# Patient Record
Sex: Female | Born: 1955 | Race: Asian | Hispanic: No | Marital: Married | Smoking: Never smoker
Health system: Southern US, Community
[De-identification: ages and names within clinical notes are randomized; demographics above are authoritative.]

## PROBLEM LIST (undated history)

## (undated) DIAGNOSIS — I471 Supraventricular tachycardia, unspecified: Secondary | ICD-10-CM

## (undated) DIAGNOSIS — K635 Polyp of colon: Secondary | ICD-10-CM

## (undated) DIAGNOSIS — M1990 Unspecified osteoarthritis, unspecified site: Secondary | ICD-10-CM

## (undated) DIAGNOSIS — M329 Systemic lupus erythematosus, unspecified: Secondary | ICD-10-CM

## (undated) DIAGNOSIS — I10 Essential (primary) hypertension: Secondary | ICD-10-CM

## (undated) DIAGNOSIS — I422 Other hypertrophic cardiomyopathy: Secondary | ICD-10-CM

## (undated) DIAGNOSIS — L930 Discoid lupus erythematosus: Secondary | ICD-10-CM

## (undated) DIAGNOSIS — M069 Rheumatoid arthritis, unspecified: Secondary | ICD-10-CM

## (undated) HISTORY — DX: Systemic lupus erythematosus, unspecified: M32.9

## (undated) HISTORY — DX: Unspecified osteoarthritis, unspecified site: M19.90

## (undated) HISTORY — DX: Supraventricular tachycardia, unspecified: I47.10

## (undated) HISTORY — DX: Supraventricular tachycardia: I47.1

## (undated) HISTORY — DX: Other hypertrophic cardiomyopathy: I42.2

## (undated) HISTORY — DX: Essential (primary) hypertension: I10

## (undated) HISTORY — DX: Rheumatoid arthritis, unspecified: M06.9

## (undated) HISTORY — PX: TUBAL LIGATION: SHX77

## (undated) HISTORY — DX: Polyp of colon: K63.5

---
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
## 1986-05-23 HISTORY — PX: BREAST BIOPSY: SHX20

## 1997-11-11 ENCOUNTER — Other Ambulatory Visit: Admission: RE | Admit: 1997-11-11 | Discharge: 1997-11-11 | Payer: Self-pay | Admitting: Dermatology

## 2004-03-01 ENCOUNTER — Other Ambulatory Visit: Admission: RE | Admit: 2004-03-01 | Discharge: 2004-03-01 | Payer: Self-pay | Admitting: Family Medicine

## 2004-04-02 LAB — HM COLONOSCOPY: HM Colonoscopy: NEGATIVE

## 2009-11-30 DIAGNOSIS — M329 Systemic lupus erythematosus, unspecified: Secondary | ICD-10-CM | POA: Insufficient documentation

## 2009-11-30 DIAGNOSIS — L930 Discoid lupus erythematosus: Secondary | ICD-10-CM

## 2009-11-30 HISTORY — DX: Discoid lupus erythematosus: L93.0

## 2010-03-23 HISTORY — PX: TRANSTHORACIC ECHOCARDIOGRAM: SHX275

## 2010-04-08 ENCOUNTER — Ambulatory Visit: Payer: Self-pay | Admitting: Family Medicine

## 2010-04-08 ENCOUNTER — Encounter: Admission: RE | Admit: 2010-04-08 | Discharge: 2010-04-08 | Payer: Self-pay | Admitting: Internal Medicine

## 2010-04-12 ENCOUNTER — Ambulatory Visit (HOSPITAL_COMMUNITY): Admission: RE | Admit: 2010-04-12 | Discharge: 2010-04-12 | Payer: Self-pay | Admitting: Internal Medicine

## 2010-04-12 HISTORY — PX: CARDIAC CATHETERIZATION: SHX172

## 2010-09-16 ENCOUNTER — Other Ambulatory Visit (HOSPITAL_COMMUNITY)
Admission: RE | Admit: 2010-09-16 | Discharge: 2010-09-16 | Disposition: A | Discharge status: Home / Self Care | Payer: 59 | Source: Ambulatory Visit | Attending: Family Medicine | Admitting: Family Medicine

## 2010-09-16 ENCOUNTER — Encounter (INDEPENDENT_AMBULATORY_CARE_PROVIDER_SITE_OTHER): Payer: 59 | Admitting: Family Medicine

## 2010-09-16 ENCOUNTER — Other Ambulatory Visit: Payer: Self-pay | Admitting: Family Medicine

## 2010-09-16 DIAGNOSIS — Z124 Encounter for screening for malignant neoplasm of cervix: Secondary | ICD-10-CM | POA: Insufficient documentation

## 2010-09-16 DIAGNOSIS — E7800 Pure hypercholesterolemia, unspecified: Secondary | ICD-10-CM

## 2010-09-16 DIAGNOSIS — Z0000 Encounter for general adult medical examination without abnormal findings: Secondary | ICD-10-CM

## 2010-09-17 LAB — HM PAP SMEAR: HM Pap smear: NEGATIVE

## 2010-10-07 LAB — HM MAMMOGRAPHY: HM Mammogram: NEGATIVE

## 2011-01-04 ENCOUNTER — Encounter: Payer: Self-pay | Admitting: Family Medicine

## 2011-07-19 ENCOUNTER — Encounter: Payer: Self-pay | Admitting: Family Medicine

## 2011-07-19 ENCOUNTER — Ambulatory Visit (INDEPENDENT_AMBULATORY_CARE_PROVIDER_SITE_OTHER): Payer: 59 | Admitting: Family Medicine

## 2011-07-19 VITALS — BP 117/70 | HR 67 | Wt 157.0 lb

## 2011-07-19 DIAGNOSIS — H8110 Benign paroxysmal vertigo, unspecified ear: Secondary | ICD-10-CM

## 2011-07-19 MED ORDER — MECLIZINE HCL 12.5 MG PO TABS: 12.5000 mg | ORAL_TABLET | Freq: Three times a day (TID) | ORAL | Status: AC | PRN

## 2011-07-19 NOTE — Progress Notes (Signed)
  Subjective:    Patient ID: Alicia Raymond, female    DOB: 30-Apr-1956, 56 y.o.   MRN: [id number]  HPI She is here for evaluation of dizziness. She noted this starting last night. She does have a previous history of right earache for 3 days prior to that but no fever, chills, double vision, heart rate changes. She does note that the dizziness is worse with certain head positions. She also has some nausea associated with this. It is bothering her less today.   Review of Systems     Objective:   Physical Exam alert and in no distress. Tympanic membranes and canals are normal. Throat is clear. Tonsils are normal. Neck is supple without adenopathy or thyromegaly. Cardiac exam shows a regular sinus rhythm without murmurs or gallops. Lungs are clear to auscultation. Dizziness was elicited by turning her head to the right as well as upward. Less dizziness was noted turning to the left.       Assessment & Plan:   1. Benign paroxysmal positional vertigo    I reassured her that she was not in any danger. Discussed possible options including Epley maneuvers. Initially I will use Antivert and if no improvement, refer from the maneuvers.

## 2012-10-01 ENCOUNTER — Encounter: Payer: Self-pay | Admitting: Family Medicine

## 2012-10-08 LAB — HM DEXA SCAN

## 2012-10-10 ENCOUNTER — Encounter: Payer: Self-pay | Admitting: Internal Medicine

## 2012-11-08 ENCOUNTER — Encounter: Payer: Self-pay | Admitting: Internal Medicine

## 2012-11-08 ENCOUNTER — Ambulatory Visit (INDEPENDENT_AMBULATORY_CARE_PROVIDER_SITE_OTHER): Payer: 59 | Admitting: Internal Medicine

## 2012-11-08 VITALS — BP 120/78 | HR 74 | Ht 62.0 in | Wt 167.7 lb

## 2012-11-08 DIAGNOSIS — M329 Systemic lupus erythematosus, unspecified: Secondary | ICD-10-CM

## 2012-11-08 DIAGNOSIS — I422 Other hypertrophic cardiomyopathy: Secondary | ICD-10-CM

## 2012-11-08 DIAGNOSIS — I428 Other cardiomyopathies: Secondary | ICD-10-CM

## 2012-11-08 DIAGNOSIS — I10 Essential (primary) hypertension: Secondary | ICD-10-CM

## 2012-11-08 DIAGNOSIS — M069 Rheumatoid arthritis, unspecified: Secondary | ICD-10-CM

## 2012-11-08 MED ORDER — LOSARTAN POTASSIUM 50 MG PO TABS: 50.0000 mg | ORAL_TABLET | Freq: Every day | ORAL | Status: DC

## 2012-11-08 NOTE — Patient Instructions (Addendum)
Your physician wants you to follow-up in: 1 year. You will receive a reminder letter in the mail two months in advance. If you don't receive a letter, please call our office to schedule the follow-up appointment.  We have sent a refill for Losartan 50mg  to your Pharmacy. Take 1 tab (50mg ) daily.

## 2012-11-09 ENCOUNTER — Encounter: Payer: Self-pay | Admitting: Internal Medicine

## 2012-11-09 DIAGNOSIS — I10 Essential (primary) hypertension: Secondary | ICD-10-CM | POA: Insufficient documentation

## 2012-11-09 DIAGNOSIS — I422 Other hypertrophic cardiomyopathy: Secondary | ICD-10-CM | POA: Insufficient documentation

## 2012-11-09 NOTE — Progress Notes (Signed)
  OFFICE NOTE  Chief Complaint:  Routine followup  Primary Care Physician: Carollee Herter, MD  HPI:  Alicia Raymond is a 57 year old female who has a history of apical hypertrophic cardiomyopathy as well as mild hypertension, rheumatoid arthritis and lupus. Overall, those connective tissue disorders have been very well controlled and she has been on losartan, which is hopefully going to be helpful with providing her some prevention from apical hypertrophy. Over the past year, she has stopped exercising, and gained a small amount of weight. Otherwise she denies any chest pain, worsening shortness of breath, palpitations, presyncope or syncopal symptoms.  PMHx:  Past Medical History  Diagnosis Date  . Lupus     DISCOID  . Rheumatoid arthritis(714.0)   . Hypertension     History reviewed. No pertinent past surgical history.  FAMHx:  No family history on file.  SOCHx:   reports that she has never smoked. She has never used smokeless tobacco. Her alcohol and drug histories are not on file.  ALLERGIES:  Allergies  Allergen Reactions  . Septra (Bactrim)     ROS: A comprehensive review of systems was negative except for: Behavioral/Psych: positive for anxiety  HOME MEDS: Current Outpatient Prescriptions  Medication Sig Dispense Refill  . CALCIUM PO Take 500 mg by mouth daily.      . Cholecalciferol (VITAMIN D PO) Take 500 mg by mouth daily.      . hydroxychloroquine (PLAQUENIL) 200 MG tablet Take 200 mg by mouth 2 (two) times daily.       Marland Kitchen losartan (COZAAR) 50 MG tablet Take 1 tablet (50 mg total) by mouth daily.  90 tablet  3  . meloxicam (MOBIC) 15 MG tablet Take 15 mg by mouth daily.      . Omega-3 Fatty Acids (FISH OIL) 1000 MG CAPS Take 1 capsule by mouth daily.      Marland Kitchen omeprazole (PRILOSEC) 20 MG capsule Take 20 mg by mouth daily.       No current facility-administered medications for this visit.    LABS/IMAGING: No results found for this or any previous  visit (from the past 48 hour(s)). No results found.  VITALS: BP 120/78  Pulse 74  Ht 5\' 2"  (1.575 m)  Wt 167 lb 11.2 oz (76.068 kg)  BMI 30.66 kg/m2  EXAM: General appearance: alert and no distress Neck: no adenopathy, no carotid bruit, no JVD, supple, symmetrical, trachea midline and thyroid not enlarged, symmetric, no tenderness/mass/nodules Lungs: clear to auscultation bilaterally Heart: regular rate and rhythm, S1, S2 normal, no murmur, click, rub or gallop Abdomen: soft, non-tender; bowel sounds normal; no masses,  no organomegaly Extremities: extremities normal, atraumatic, no cyanosis or edema Pulses: 2+ and symmetric Skin: Skin color, texture, turgor normal. No rashes or lesions Neurologic: Grossly normal  EKG: Normal sinus rhythm at 74 with T wave inversions in V3 through V6 suggestive of apical hypertrophic cardiomyopathy  ASSESSMENT: 1. Apical variant hypertrophic arthropathy 2. Mild hypertension  PLAN: 1.   Ms. Forrest is doing well on losartan, with good blood pressure control and hopefully this will help to allay any worsening in her apical hypertrophic. She denies any chest pain and is very active. We'll continue her current medications and plan to see her back annually or sooner as necessary.  Chrystie Nose, MD, [facility] Attending Cardiologist [facility]  HILTY,Kenneth C 11/09/2012, 1:07 PM

## 2013-02-07 ENCOUNTER — Telehealth: Payer: Self-pay | Admitting: Internal Medicine

## 2013-02-07 NOTE — Telephone Encounter (Signed)
Faxed over medical records to Dr. Corliss Skains @ [phone]

## 2013-02-14 ENCOUNTER — Encounter: Payer: Self-pay | Admitting: Internal Medicine

## 2013-10-05 LAB — HM MAMMOGRAPHY: HM Mammogram: NEGATIVE

## 2013-10-07 ENCOUNTER — Encounter: Payer: Self-pay | Admitting: Internal Medicine

## 2013-10-18 ENCOUNTER — Telehealth: Payer: Self-pay

## 2013-10-18 ENCOUNTER — Telehealth: Payer: Self-pay | Admitting: Family Medicine

## 2013-10-18 NOTE — Telephone Encounter (Signed)
Please call re: mammogram results ( report was sent to you per pt)

## 2013-10-18 NOTE — Telephone Encounter (Signed)
Pt states she is going to fax over mammogram info and states it reavealed that she has dense breast tissue and was told to follow up with her PCP and would like to know if she really needs to come in to discuss this with you or does she follow back up with [facility] or what does she do from here. Please advise.

## 2013-10-18 NOTE — Telephone Encounter (Signed)
Pt notified to continue to do at home Breast checks and to continue to have yearly mammograms

## 2013-11-06 ENCOUNTER — Encounter: Payer: Self-pay | Admitting: Internal Medicine

## 2013-11-06 ENCOUNTER — Encounter: Payer: Self-pay | Admitting: *Deleted

## 2013-11-11 ENCOUNTER — Ambulatory Visit (INDEPENDENT_AMBULATORY_CARE_PROVIDER_SITE_OTHER): Payer: 59 | Admitting: Internal Medicine

## 2013-11-11 ENCOUNTER — Encounter: Payer: Self-pay | Admitting: Internal Medicine

## 2013-11-11 VITALS — BP 122/74 | HR 72 | Ht 61.0 in | Wt 167.6 lb

## 2013-11-11 DIAGNOSIS — I428 Other cardiomyopathies: Secondary | ICD-10-CM

## 2013-11-11 DIAGNOSIS — I10 Essential (primary) hypertension: Secondary | ICD-10-CM

## 2013-11-11 DIAGNOSIS — R0789 Other chest pain: Secondary | ICD-10-CM

## 2013-11-11 DIAGNOSIS — I422 Other hypertrophic cardiomyopathy: Secondary | ICD-10-CM

## 2013-11-11 MED ORDER — LOSARTAN POTASSIUM 50 MG PO TABS: 50.0000 mg | ORAL_TABLET | Freq: Every day | ORAL | Status: DC

## 2013-11-11 NOTE — Patient Instructions (Signed)
Your physician wants you to follow-up in: 1 year. You will receive a reminder letter in the mail two months in advance. If you don't receive a letter, please call our office to schedule the follow-up appointment.  

## 2013-11-14 ENCOUNTER — Other Ambulatory Visit: Payer: Self-pay | Admitting: Internal Medicine

## 2013-11-15 NOTE — Telephone Encounter (Signed)
Rx refill sent to patient pharmacy   

## 2013-11-21 ENCOUNTER — Encounter: Payer: Self-pay | Admitting: Internal Medicine

## 2013-11-21 DIAGNOSIS — R0789 Other chest pain: Secondary | ICD-10-CM | POA: Insufficient documentation

## 2013-11-21 NOTE — Progress Notes (Signed)
OFFICE NOTE  Chief Complaint:  Routine followup  Primary Care Physician: Wyatt Haste, MD  HPI:  Alicia Raymond is a 59 year old female who has a history of apical hypertrophic cardiomyopathy as well as mild hypertension, rheumatoid arthritis and lupus. Overall, those connective tissue disorders have been very well controlled and she has been on losartan, which is hopefully going to be helpful with providing her some prevention from apical hypertrophy. Over the past year, she has stopped exercising, and gained a small amount of weight. She has had a couple of episodes of chest discomfort at night over the past year however they have subsided.  PMHx:  Past Medical History  Diagnosis Date  . Lupus     DISCOID  . Rheumatoid arthritis(714.0)   . Hypertension   . Systemic lupus 11/30/09  . Apical variant hypertrophic cardiomyopathy     Past Surgical History  Procedure Laterality Date  . Transthoracic echocardiogram  03/2010    EF=>55%, apical hypertrophy, mod conc LVH; mild MR; trace TR with normal RVSP  . Cardiac catheterization  04/12/2010    LAD with 30% narrowing - minimal CAD (Dr. Alma Friendly)    FAMHx:  Family History  Problem Relation Age of Onset  . Lung cancer Father   . COPD Brother   . Hyperlipidemia Brother   . Other Sister     brain tumor    SOCHx:   reports that she has never smoked. She has never used smokeless tobacco. Her alcohol and drug histories are not on file.  ALLERGIES:  Allergies  Allergen Reactions  . Asa [Aspirin]   . Septra [Bactrim]     ROS: A comprehensive review of systems was negative except for: Behavioral/Psych: positive for anxiety  HOME MEDS: Current Outpatient Prescriptions  Medication Sig Dispense Refill  . CALCIUM PO Take 500 mg by mouth daily.      . Cholecalciferol (VITAMIN D PO) Take 500 mg by mouth daily.      . hydroxychloroquine (PLAQUENIL) 200 MG tablet Take 200 mg by mouth 2 (two) times  daily.       . Omega-3 Fatty Acids (FISH OIL) 1000 MG CAPS Take 1 capsule by mouth daily.      Marland Kitchen losartan (COZAAR) 50 MG tablet TAKE 1 TABLET BY MOUTH DAILY  30 tablet  4   No current facility-administered medications for this visit.    LABS/IMAGING: No results found for this or any previous visit (from the past 48 hour(s)). No results found.  VITALS: BP 122/74  Pulse 72  Ht 5\' 1"  (1.549 m)  Wt 167 lb 9.6 oz (76.023 kg)  BMI 31.68 kg/m2  EXAM: General appearance: alert and no distress Neck: no adenopathy, no carotid bruit, no JVD, supple, symmetrical, trachea midline and thyroid not enlarged, symmetric, no tenderness/mass/nodules Lungs: clear to auscultation bilaterally Heart: regular rate and rhythm, S1, S2 normal, no murmur, click, rub or gallop Abdomen: soft, non-tender; bowel sounds normal; no masses,  no organomegaly Extremities: extremities normal, atraumatic, no cyanosis or edema Pulses: 2+ and symmetric Skin: Skin color, texture, turgor normal. No rashes or lesions Neurologic: Grossly normal  EKG: Normal sinus rhythm at 72 with T wave inversions in V3 through V6 suggestive of apical hypertrophic cardiomyopathy  ASSESSMENT: 1. Apical variant hypertrophic arthropathy 2. Mild hypertension 3. Atypical chest pain  PLAN: 1.   Alicia Raymond is doing well on losartan, with good blood pressure control and hopefully this will help to allay any worsening in her apical hypertrophic  cardiomyopathy.  She reports very atypical chest pain symptoms which do not sound cardiac. We'll continue her current medications and plan to see her back annually or sooner as necessary.  Pixie Casino, MD, [facility] Attending Cardiologist [facility] 11/21/2013, 6:47 PM

## 2014-02-13 ENCOUNTER — Encounter: Payer: Self-pay | Admitting: Internal Medicine

## 2014-03-10 ENCOUNTER — Encounter: Payer: Self-pay | Admitting: Family Medicine

## 2014-03-10 ENCOUNTER — Ambulatory Visit (INDEPENDENT_AMBULATORY_CARE_PROVIDER_SITE_OTHER): Payer: 59 | Admitting: Family Medicine

## 2014-03-10 ENCOUNTER — Other Ambulatory Visit (HOSPITAL_COMMUNITY)
Admission: RE | Admit: 2014-03-10 | Discharge: 2014-03-10 | Disposition: A | Discharge status: Home / Self Care | Payer: 59 | Source: Ambulatory Visit | Attending: Family Medicine | Admitting: Family Medicine

## 2014-03-10 VITALS — BP 124/80 | HR 72 | Ht 63.0 in | Wt 168.0 lb

## 2014-03-10 DIAGNOSIS — Z0000 Encounter for general adult medical examination without abnormal findings: Secondary | ICD-10-CM

## 2014-03-10 DIAGNOSIS — M329 Systemic lupus erythematosus, unspecified: Secondary | ICD-10-CM

## 2014-03-10 DIAGNOSIS — Z1151 Encounter for screening for human papillomavirus (HPV): Secondary | ICD-10-CM | POA: Diagnosis present

## 2014-03-10 DIAGNOSIS — R5383 Other fatigue: Secondary | ICD-10-CM

## 2014-03-10 DIAGNOSIS — Z01419 Encounter for gynecological examination (general) (routine) without abnormal findings: Secondary | ICD-10-CM | POA: Diagnosis present

## 2014-03-10 DIAGNOSIS — I422 Other hypertrophic cardiomyopathy: Secondary | ICD-10-CM

## 2014-03-10 DIAGNOSIS — I10 Essential (primary) hypertension: Secondary | ICD-10-CM

## 2014-03-10 DIAGNOSIS — M069 Rheumatoid arthritis, unspecified: Secondary | ICD-10-CM

## 2014-03-10 LAB — CBC WITH DIFFERENTIAL/PLATELET
Basophils Absolute: 0 10*3/uL (ref 0.0–0.1)
Basophils Relative: 0 % (ref 0–1)
Eosinophils Absolute: 0.2 10*3/uL (ref 0.0–0.7)
Eosinophils Relative: 3 % (ref 0–5)
HCT: 43.2 % (ref 36.0–46.0)
Hemoglobin: 13.9 g/dL (ref 12.0–15.0)
Lymphocytes Relative: 40 % (ref 12–46)
Lymphs Abs: 2 10*3/uL (ref 0.7–4.0)
MCH: 29.1 pg (ref 26.0–34.0)
MCHC: 32.2 g/dL (ref 30.0–36.0)
MCV: 90.4 fL (ref 78.0–100.0)
Monocytes Absolute: 0.4 10*3/uL (ref 0.1–1.0)
Monocytes Relative: 7 % (ref 3–12)
Neutro Abs: 2.5 10*3/uL (ref 1.7–7.7)
Neutrophils Relative %: 50 % (ref 43–77)
Platelets: 229 10*3/uL (ref 150–400)
RBC: 4.78 MIL/uL (ref 3.87–5.11)
RDW: 14.6 % (ref 11.5–15.5)
WBC: 5 10*3/uL (ref 4.0–10.5)

## 2014-03-10 LAB — POCT URINALYSIS DIPSTICK
Bilirubin, UA: NEGATIVE
Blood, UA: NEGATIVE
Glucose, UA: NEGATIVE
Ketones, UA: NEGATIVE
Leukocytes, UA: NEGATIVE
Nitrite, UA: NEGATIVE
Protein, UA: NEGATIVE
Spec Grav, UA: <=1.005
Urobilinogen, UA: NEGATIVE
pH, UA: 7

## 2014-03-10 LAB — COMPREHENSIVE METABOLIC PANEL
ALT: 16 U/L (ref 0–35)
AST: 24 U/L (ref 0–37)
Albumin: 4.3 g/dL (ref 3.5–5.2)
Alkaline Phosphatase: 42 U/L (ref 39–117)
BUN: 12 mg/dL (ref 6–23)
CO2: 29 mEq/L (ref 19–32)
Calcium: 9.5 mg/dL (ref 8.4–10.5)
Chloride: 102 mEq/L (ref 96–112)
Creat: 0.76 mg/dL (ref 0.50–1.10)
Glucose, Bld: 91 mg/dL (ref 70–99)
Potassium: 4.8 mEq/L (ref 3.5–5.3)
Sodium: 138 mEq/L (ref 135–145)
Total Bilirubin: 0.7 mg/dL (ref 0.2–1.2)
Total Protein: 6.8 g/dL (ref 6.0–8.3)

## 2014-03-10 LAB — LIPID PANEL
Cholesterol: 219 mg/dL — ABNORMAL HIGH (ref 0–200)
HDL: 104 mg/dL (ref >39)
LDL Cholesterol: 102 mg/dL — ABNORMAL HIGH (ref 0–99)
Total CHOL/HDL Ratio: 2.1 Ratio
Triglycerides: 65 mg/dL (ref <150)
VLDL: 13 mg/dL (ref 0–40)

## 2014-03-10 LAB — TSH: TSH: 0.958 u[IU]/mL (ref 0.350–4.500)

## 2014-03-10 NOTE — Progress Notes (Signed)
Chief Complaint  Patient presents with  . fasting physical    fasting physical with pap,wants thyroid checked. had eyes checked back in april no other concerns,had mammogram already this year    Alicia Raymond is a 58 y.o. female who presents for a complete physical. I haven't seen her since her last physical in 2012. Chart and medical records were reviewed extensively. She has the following concerns:  She is frustrated that she is having trouble losing weight, and is complaining of feeling very tired. Has only been feeling tired for a few weeks.  Denies any seasonal allergies. Does report being under stress at work, and some poor sleep (for a few months, trouble falling asleep).  She doesn't drink soda or juices, just green tea and water.  Only eats a few tablespoons of rice daily.  Uses vinaigrette dressings.  Snacks on fruits. She doesn't tolerate milk.  Apical hypertrophic cardiomyopathy and hypertension:  She is under the care of Dr. Debara Pickett, last seen in July 2015. Denies any problems--no chest pain, shortness of breath, palpitations, or side effects to medications. Denies lightheadedness, dizziness, syncope.  RA and lupus:  She changed from Dr. Trudie Reed to Dr. Estanislado Pandy.  Joint pain and skin is well controlled.  Denies any rashes.  Immunization History  Administered Date(s) Administered  . Influenza Split 04/03/2012, 02/08/2013, 02/12/2014  . Pneumococcal Polysaccharide-23 02/08/2013  . Tdap 02/12/2014   Last Pap smear: 08/2010 Last mammogram: 09/2013; normal, dense breasts Last colonoscopy: UTD per pt, has had 2 (Dr. Benson Norway) Last DEXA: 12/2012 T-1.3 Dentist: twice yearly Ophtho: 09/2013 ([facility]) Exercise: walks or elliptical at least 3x/wk, sometimes can get 5x/wek, 45-60 minutes.  Uses dumbbells at the same time.  Past Medical History  Diagnosis Date  . Lupus     DISCOID  . Rheumatoid arthritis(714.0)     Dr. Estanislado Pandy  . Hypertension     Dr. Debara Pickett  . Systemic lupus  11/30/09    (pt denies systemic)  . Apical variant hypertrophic cardiomyopathy     Dr. Debara Pickett  . Colon polyp     Past Surgical History  Procedure Laterality Date  . Transthoracic echocardiogram  03/2010    EF=>55%, apical hypertrophy, mod conc LVH; mild MR; trace TR with normal RVSP  . Cardiac catheterization  04/12/2010    LAD with 30% narrowing - minimal CAD (Dr. Alma Friendly)  . Tubal ligation    . Breast biopsy Right 1988    fibroadenoma (benign)    History   Social History  . Marital Status: Married    Spouse Name: N/A    Number of Children: 3  . Years of Education: N/A   Occupational History  . customer service rep    Social History Main Topics  . Smoking status: Never Smoker   . Smokeless tobacco: Never Used  . Alcohol Use: Yes     Comment: 1-3 times/week, 1 glass of red wine  . Drug Use: No  . Sexual Activity: Yes    Partners: Male   Other Topics Concern  . Not on file   Social History Narrative   Married.  Lives with her husband.  Oldest daughter lives in [place], son in [place], [place] and daughter in [place].  5 grandchildren.  (lost 1 daughter at the age of 2.5 yo due to illness)    Family History  Problem Relation Age of Onset  . Lung cancer Father   . Cancer Father   . COPD Brother   .  Hyperlipidemia Brother   . Other Sister     brain tumor  . Hypertension Mother   . Hyperlipidemia Son   . Diabetes Neg Hx   . Colon cancer Neg Hx   . Breast cancer Neg Hx   . Cancer Sister     stomach cancer    Outpatient Encounter Prescriptions as of 03/10/2014  Medication Sig  . CALCIUM PO Take 500 mg by mouth daily.  . Cholecalciferol (VITAMIN D PO) Take 1,000 Units by mouth daily.   . hydroxychloroquine (PLAQUENIL) 200 MG tablet Take 200 mg by mouth 2 (two) times daily.   Marland Kitchen losartan (COZAAR) 50 MG tablet TAKE 1 TABLET BY MOUTH DAILY  . Omega-3 Fatty Acids (FISH OIL) 1000 MG CAPS Take 1 capsule by mouth daily.    Allergies  Allergen Reactions  . Septra  [Bactrim] Swelling    Couldn't breathe, lip swelling  . Asa [Aspirin] Rash    Upset stomach   ROS:  The patient denies anorexia, fever, weight changes (just unable to lose), headaches,  vision changes, decreased hearing, ear pain, sore throat, breast concerns, chest pain, palpitations, dizziness, syncope, dyspnea on exertion, cough, swelling, nausea, vomiting, diarrhea, constipation, abdominal pain, melena, hematochezia, indigestion/heartburn, hematuria, incontinence, dysuria, postmenopausal bleeding, vaginal discharge, odor or itch, genital lesions, joint pains, weakness, tremor, suspicious skin lesions, abnormal bleeding/bruising, or enlarged lymph nodes. Ringing in her right ear (not new); denies hearing loss. This month has been stressful at work, feeling stressed, a little down.  Will be better next month (fiscal year ending).  Some trouble falling asleep in the last few months. Numbness/tingling in her left 3rd and 4th toes since last year.  No back pain, foot pain.  PHYSICAL EXAM:  BP 124/80  Pulse 72  Ht 5\' 3"  (1.6 m)  Wt 168 lb (76.204 kg)  BMI 29.77 kg/m2  General Appearance:    Alert, cooperative, no distress, appears stated age  Head:    Normocephalic, without obvious abnormality, atraumatic  Eyes:    PERRL, conjunctiva/corneas clear, EOM's intact, fundi    benign  Ears:    Normal TM's and external ear canals  Nose:   Nares normal, mucosa normal, no drainage or sinus   tenderness  Throat:   Lips, mucosa, and tongue normal; teeth and gums normal  Neck:   Supple, no lymphadenopathy;  thyroid:  no   enlargement/tenderness/nodules; no carotid   bruit or JVD  Back:    Spine nontender, no curvature, ROM normal, no CVA     tenderness  Lungs:     Clear to auscultation bilaterally without wheezes, rales or     ronchi; respirations unlabored  Chest Wall:    No tenderness or deformity   Heart:    Regular rate and rhythm, S1 and S2 normal, no murmur, rub   or gallop  Breast Exam:     No tenderness, masses, or nipple discharge or inversion.      No axillary lymphadenopathy.  WHSS right upper breast  Abdomen:     Soft, non-tender, nondistended, normoactive bowel sounds,    no masses, no hepatosplenomegaly  Genitalia:    Normal external genitalia without lesions.  BUS and vagina normal; cervix without lesions, or cervical motion tenderness. No abnormal vaginal discharge.  Uterus and adnexa not enlarged, nontender, no masses.  Pap performed  Rectal:    Normal tone, no masses or tenderness; guaiac negative stool  Extremities:   No clubbing, cyanosis or edema  Pulses:   2+ and  symmetric all extremities  Skin:   Skin color, texture, turgor normal, no rashes or lesions  Lymph nodes:   Cervical, supraclavicular, and axillary nodes normal  Neurologic:   CNII-XII intact, normal strength, gait; reflexes 2+ and symmetric throughout. Subjective change in sensation in L 3-4th toes; intact to light touch, temperature          Psych:   Normal mood, affect, hygiene and grooming.     ASSESSMENT/PLAN:  Routine general medical examination at a health care facility - Plan: POCT urinalysis dipstick, Lipid panel, Comprehensive metabolic panel, CBC with Differential, Vit D  25 hydroxy (rtn osteoporosis monitoring), TSH, Cytology - PAP [place]  Other fatigue - check labs. may be related to stress, poor sleep - Plan: Comprehensive metabolic panel, CBC with Differential, Vit D  25 hydroxy (rtn osteoporosis monitoring), TSH  Apical variant hypertrophic cardiomyopathy - stable  Essential hypertension - controlled  Rheumatoid arthritis - controlled  Lupus - controlled   Discussed monthly self breast exams and yearly mammograms; at least 30 minutes of aerobic activity at least 5 days/week; proper sunscreen use reviewed; healthy diet, including goals of calcium and vitamin D intake and alcohol recommendations (less than or equal to 1 drink/day) reviewed; regular seatbelt use; changing batteries  in smoke detectors.  Immunization recommendations discussed--see below.  Colonoscopy recommendations reviewed, UTD.   Consider getting zostavax (shingles vaccine).  I would like for you to double-check with your rheumatologist that this live virus vaccine is okay (not sure if there are plans for you to be on immunosuppressant meds).  If so, then schedule a nurse visit (at least a month separate from your other vaccines). Risks/side effects reviewed in detail.  Return yearly for CPE's, sooner prn.

## 2014-03-10 NOTE — Patient Instructions (Addendum)
  HEALTH MAINTENANCE RECOMMENDATIONS:  It is recommended that you get at least 30 minutes of aerobic exercise at least 5 days/week (for weight loss, you may need as much as 60-90 minutes). This can be any activity that gets your heart rate up. This can be divided in 10-15 minute intervals if needed, but try and build up your endurance at least once a week.  Weight bearing exercise is also recommended twice weekly.  Eat a healthy diet with lots of vegetables, fruits and fiber.  "Colorful" foods have a lot of vitamins (ie green vegetables, tomatoes, red peppers, etc).  Limit sweet tea, regular sodas and alcoholic beverages, all of which has a lot of calories and sugar.  Up to 1 alcoholic drink daily may be beneficial for women (unless trying to lose weight, watch sugars).  Drink a lot of water.  Calcium recommendations are 1200-1500 mg daily (1500 mg for postmenopausal women or women without ovaries), and vitamin D 1000 IU daily.  This should be obtained from diet and/or supplements (vitamins), and calcium should not be taken all at once, but in divided doses.  Monthly self breast exams and yearly mammograms for women over the age of 65 is recommended.  Sunscreen of at least SPF 30 should be used on all sun-exposed parts of the skin when outside between the hours of 10 am and 4 pm (not just when at beach or pool, but even with exercise, golf, tennis, and yard work!)  Use a sunscreen that says "broad spectrum" so it covers both UVA and UVB rays, and make sure to reapply every 1-2 hours.  Remember to change the batteries in your smoke detectors when changing your clock times in the spring and fall.  Use your seat belt every time you are in a car, and please drive safely and not be distracted with cell phones and texting while driving.  Consider getting zostavax (shingles vaccine).  I would like for you to double-check with your rheumatologist that this live virus vaccine is okay (not sure if there are  plans for you to be on immunosuppressant meds).  If so, then schedule a nurse visit (at least a month separate from your other vaccines).

## 2014-03-11 ENCOUNTER — Encounter: Payer: Self-pay | Admitting: Family Medicine

## 2014-03-11 LAB — VITAMIN D 25 HYDROXY (VIT D DEFICIENCY, FRACTURES): Vit D, 25-Hydroxy: 45 ng/mL (ref 30–89)

## 2014-03-12 ENCOUNTER — Encounter: Payer: Self-pay | Admitting: Family Medicine

## 2014-03-12 LAB — CYTOLOGY - PAP

## 2014-03-24 ENCOUNTER — Encounter: Payer: Self-pay | Admitting: Family Medicine

## 2014-05-01 LAB — HM COLONOSCOPY

## 2014-08-26 ENCOUNTER — Ambulatory Visit (INDEPENDENT_AMBULATORY_CARE_PROVIDER_SITE_OTHER): Payer: 59 | Admitting: Family Medicine

## 2014-08-26 ENCOUNTER — Encounter: Payer: Self-pay | Admitting: Family Medicine

## 2014-08-26 VITALS — BP 110/70 | HR 68 | Temp 98.5°F | Wt 165.0 lb

## 2014-08-26 DIAGNOSIS — J209 Acute bronchitis, unspecified: Secondary | ICD-10-CM

## 2014-08-26 DIAGNOSIS — Z23 Encounter for immunization: Secondary | ICD-10-CM

## 2014-08-26 MED ORDER — AMOXICILLIN 875 MG PO TABS: 875.0000 mg | ORAL_TABLET | Freq: Two times a day (BID) | ORAL | Status: DC

## 2014-08-26 NOTE — Patient Instructions (Signed)
Take all the medicine and if you're still having trouble when you finish,give me a call

## 2014-08-26 NOTE — Progress Notes (Signed)
   Subjective:    Patient ID: Alicia Raymond, female    DOB: Nov 17, 1955, 59 y.o.   MRN: [id number]  HPI She complains of a one-month history of intermittently productive cough but no sore throat, earache, nasal congestion, rhinorrhea, PND. She does occasionally has some chest discomfort when she coughs. She does not smoke and does not have any underlying allergies. She would also like her shingles vaccine.   Review of Systems     Objective:   Physical Exam Alert and in no distress. Tympanic membranes and canals are normal. Pharyngeal area is normal. Neck is supple without adenopathy or thyromegaly. Cardiac exam shows a regular sinus rhythm without murmurs or gallops. Lungs are clear to auscultation.No chest wall tenderness. Nasal mucosa is normal        Assessment & Plan:  Acute bronchitis, unspecified organism - Plan: amoxicillin (AMOXIL) 875 MG tablet  Need for prophylactic vaccination and inoculation against unspecified single disease - Plan: Varicella-zoster vaccine subcutaneous She is to call if not entirely better when she finishes the antibiotic.

## 2014-09-11 ENCOUNTER — Encounter: Payer: Self-pay | Admitting: Family Medicine

## 2014-09-11 ENCOUNTER — Ambulatory Visit (INDEPENDENT_AMBULATORY_CARE_PROVIDER_SITE_OTHER): Payer: 59 | Admitting: Family Medicine

## 2014-09-11 VITALS — BP 118/80 | HR 74 | Temp 98.1°F | Wt 164.2 lb

## 2014-09-11 DIAGNOSIS — J209 Acute bronchitis, unspecified: Secondary | ICD-10-CM | POA: Diagnosis not present

## 2014-09-11 DIAGNOSIS — R042 Hemoptysis: Secondary | ICD-10-CM | POA: Diagnosis not present

## 2014-09-11 LAB — CBC WITH DIFFERENTIAL/PLATELET
Basophils Absolute: 0.1 10*3/uL (ref 0.0–0.1)
Basophils Relative: 1 % (ref 0–1)
Eosinophils Absolute: 0.3 10*3/uL (ref 0.0–0.7)
Eosinophils Relative: 5 % (ref 0–5)
HCT: 41.7 % (ref 36.0–46.0)
Hemoglobin: 13.5 g/dL (ref 12.0–15.0)
Lymphocytes Relative: 43 % (ref 12–46)
Lymphs Abs: 2.4 10*3/uL (ref 0.7–4.0)
MCH: 29.2 pg (ref 26.0–34.0)
MCHC: 32.4 g/dL (ref 30.0–36.0)
MCV: 90.3 fL (ref 78.0–100.0)
MPV: 10.4 fL (ref 8.6–12.4)
Monocytes Absolute: 0.5 10*3/uL (ref 0.1–1.0)
Monocytes Relative: 9 % (ref 3–12)
Neutro Abs: 2.3 10*3/uL (ref 1.7–7.7)
Neutrophils Relative %: 42 % — ABNORMAL LOW (ref 43–77)
Platelets: 220 10*3/uL (ref 150–400)
RBC: 4.62 MIL/uL (ref 3.87–5.11)
RDW: 13.3 % (ref 11.5–15.5)
WBC: 5.5 10*3/uL (ref 4.0–10.5)

## 2014-09-11 MED ORDER — AMOXICILLIN-POT CLAVULANATE 875-125 MG PO TABS: 1.0000 | ORAL_TABLET | Freq: Two times a day (BID) | ORAL | Status: DC

## 2014-09-11 MED ORDER — HYDROCOD POLST-CPM POLST ER 10-8 MG/5ML PO SUER: 5.0000 mL | Freq: Two times a day (BID) | ORAL | Status: DC | PRN

## 2014-09-11 NOTE — Progress Notes (Signed)
   Subjective:    Patient ID: Alicia Raymond, female    DOB: 15-Aug-1955, 59 y.o.   MRN: [id number]  HPI She is here for recheck. She states she did get 50% better with her cough and congestion but did not call back as previously recommended. Since she stopped the antibiotic she has noted increasing congestion, cough, sore throat and now sees blood in her expectorant. No fever, chills, weight loss, shortness of breath.   Review of Systems     Objective:   Physical Exam Alert and in no distress. Tympanic membranes and canals are normal. Pharyngeal area is normal. Neck is supple without adenopathy or thyromegaly. Cardiac exam shows a regular sinus rhythm without murmurs or gallops. Lungs are clear to auscultation.        Assessment & Plan:  Hemoptysis - Plan: CBC with Differential/Platelet, Comprehensive metabolic panel, DG Chest 2 View, amoxicillin-clavulanate (AUGMENTIN) 875-125 MG per tablet, chlorpheniramine-HYDROcodone (TUSSIONEX PENNKINETIC ER) 10-8 MG/5ML SUER  Acute bronchitis, unspecified organism

## 2014-09-12 ENCOUNTER — Ambulatory Visit
Admission: RE | Admit: 2014-09-12 | Discharge: 2014-09-12 | Disposition: A | Discharge status: Home / Self Care | Payer: 59 | Source: Ambulatory Visit | Attending: Family Medicine | Admitting: Family Medicine

## 2014-09-12 DIAGNOSIS — R042 Hemoptysis: Secondary | ICD-10-CM

## 2014-09-12 LAB — COMPREHENSIVE METABOLIC PANEL
ALT: 20 U/L (ref 0–35)
AST: 26 U/L (ref 0–37)
Albumin: 3.8 g/dL (ref 3.5–5.2)
Alkaline Phosphatase: 47 U/L (ref 39–117)
BUN: 18 mg/dL (ref 6–23)
CO2: 29 mEq/L (ref 19–32)
Calcium: 9 mg/dL (ref 8.4–10.5)
Chloride: 102 mEq/L (ref 96–112)
Creat: 0.88 mg/dL (ref 0.50–1.10)
Glucose, Bld: 79 mg/dL (ref 70–99)
Potassium: 4.2 mEq/L (ref 3.5–5.3)
Sodium: 138 mEq/L (ref 135–145)
Total Bilirubin: 0.4 mg/dL (ref 0.2–1.2)
Total Protein: 6.9 g/dL (ref 6.0–8.3)

## 2014-09-22 ENCOUNTER — Telehealth: Payer: Self-pay | Admitting: Internal Medicine

## 2014-09-22 DIAGNOSIS — R042 Hemoptysis: Secondary | ICD-10-CM

## 2014-09-22 MED ORDER — AMOXICILLIN-POT CLAVULANATE 875-125 MG PO TABS: 1.0000 | ORAL_TABLET | Freq: Two times a day (BID) | ORAL | Status: DC

## 2014-09-22 NOTE — Telephone Encounter (Signed)
Left message for pt stating Dr. Redmond School recommendations

## 2014-09-22 NOTE — Telephone Encounter (Signed)
Pt called stating she finished the antibotic last night and she still has a cough and her mucous is greenish yellow. She would like something to take for her cough during the day and then another round of antibotic. Send to [facility]

## 2014-09-22 NOTE — Telephone Encounter (Signed)
I will have her use Robitussin-DM during the day and another round of Augmentin.

## 2014-09-22 NOTE — Telephone Encounter (Signed)
Let her know that I called in the antibiotic and have her use Robitussin-DM during the day

## 2014-10-21 LAB — HM DEXA SCAN

## 2014-10-27 LAB — HM MAMMOGRAPHY

## 2014-10-29 ENCOUNTER — Encounter: Payer: Self-pay | Admitting: Family Medicine

## 2014-11-03 ENCOUNTER — Encounter: Payer: Self-pay | Admitting: Family Medicine

## 2014-11-10 ENCOUNTER — Other Ambulatory Visit: Payer: Self-pay | Admitting: Internal Medicine

## 2014-11-10 NOTE — Telephone Encounter (Signed)
Rx(s) sent to pharmacy electronically.  

## 2014-11-12 ENCOUNTER — Other Ambulatory Visit: Payer: Self-pay | Admitting: Internal Medicine

## 2014-11-12 ENCOUNTER — Telehealth: Payer: Self-pay | Admitting: Internal Medicine

## 2014-11-18 NOTE — Telephone Encounter (Signed)
Close encounter 

## 2014-12-05 ENCOUNTER — Encounter: Payer: Self-pay | Admitting: Internal Medicine

## 2015-01-12 ENCOUNTER — Encounter: Payer: Self-pay | Admitting: Internal Medicine

## 2015-01-12 ENCOUNTER — Ambulatory Visit (INDEPENDENT_AMBULATORY_CARE_PROVIDER_SITE_OTHER): Payer: 59 | Admitting: Internal Medicine

## 2015-01-12 VITALS — BP 118/70 | HR 83 | Ht 62.5 in | Wt 153.9 lb

## 2015-01-12 DIAGNOSIS — I422 Other hypertrophic cardiomyopathy: Secondary | ICD-10-CM

## 2015-01-12 DIAGNOSIS — M069 Rheumatoid arthritis, unspecified: Secondary | ICD-10-CM | POA: Diagnosis not present

## 2015-01-12 DIAGNOSIS — M329 Systemic lupus erythematosus, unspecified: Secondary | ICD-10-CM | POA: Diagnosis not present

## 2015-01-12 DIAGNOSIS — IMO0002 Reserved for concepts with insufficient information to code with codable children: Secondary | ICD-10-CM

## 2015-01-12 DIAGNOSIS — I10 Essential (primary) hypertension: Secondary | ICD-10-CM | POA: Diagnosis not present

## 2015-01-12 NOTE — Progress Notes (Signed)
OFFICE NOTE  Chief Complaint:  Routine followup  Primary Care Physician: Wyatt Haste, MD  HPI:  Alicia Raymond is a 59 year old female who has a history of apical hypertrophic cardiomyopathy as well as mild hypertension, rheumatoid arthritis and lupus. Overall, those connective tissue disorders have been very well controlled and she has been on losartan, which is hopefully going to be helpful with providing her some prevention from apical hypertrophy. Over the past year, she has stopped exercising, and gained a small amount of weight. She has had a couple of episodes of chest discomfort at night over the past year however they have subsided.  I had the pleasure of seeing Alicia Raymond back in the office today. Overall she's done very well over the past year. She started doing significant exercise almost every morning. Her weight is down now more than 15 pounds to 153. Blood pressure is well controlled at 118/70. She remains on losartan, mostly for apical hypertrophic cardiomyopathy. She's had no chest pain or worsening shortness of breath. She reports her rheumatoid arthritis is better controlled in fact she is on a lower dose of Plaquenil.  PMHx:  Past Medical History  Diagnosis Date  . Lupus     DISCOID  . Rheumatoid arthritis(714.0)     Dr. Estanislado Pandy  . Hypertension     Dr. Debara Pickett  . Systemic lupus 11/30/09    (pt denies systemic)  . Apical variant hypertrophic cardiomyopathy     Dr. Debara Pickett  . Colon polyp     Past Surgical History  Procedure Laterality Date  . Transthoracic echocardiogram  03/2010    EF=>55%, apical hypertrophy, mod conc LVH; mild MR; trace TR with normal RVSP  . Cardiac catheterization  04/12/2010    LAD with 30% narrowing - minimal CAD (Dr. Alma Friendly)  . Tubal ligation    . Breast biopsy Right 1988    fibroadenoma (benign)    FAMHx:  Family History  Problem Relation Age of Onset  . Lung cancer Father   . Cancer Father    . COPD Brother   . Hyperlipidemia Brother   . Other Sister     brain tumor  . Hypertension Mother   . Hyperlipidemia Son   . Diabetes Neg Hx   . Colon cancer Neg Hx   . Breast cancer Neg Hx   . Cancer Sister     stomach cancer    SOCHx:   reports that she has never smoked. She has never used smokeless tobacco. She reports that she drinks alcohol. She reports that she does not use illicit drugs.  ALLERGIES:  Allergies  Allergen Reactions  . Septra [Bactrim] Swelling    Couldn't breathe, lip swelling  . Asa [Aspirin] Rash    Upset stomach    ROS: A comprehensive review of systems was negative.  HOME MEDS: Current Outpatient Prescriptions  Medication Sig Dispense Refill  . CALCIUM PO Take 500 mg by mouth daily.    . Cholecalciferol (VITAMIN D PO) Take 1,000 Units by mouth daily.     . hydroxychloroquine (PLAQUENIL) 200 MG tablet Take 200 mg by mouth 2 (two) times daily.     Marland Kitchen losartan (COZAAR) 50 MG tablet TAKE 1 TABLET BY MOUTH DAILY. 30 tablet 1  . Omega-3 Fatty Acids (FISH OIL) 1000 MG CAPS Take 1 capsule by mouth daily.     No current facility-administered medications for this visit.    LABS/IMAGING: No results found for this or any previous visit (from the  past 48 hour(s)). No results found.  VITALS: BP 118/70 mmHg  Pulse 83  Ht 5' 2.5" (1.588 m)  Wt 153 lb 14.4 oz (69.809 kg)  BMI 27.68 kg/m2  EXAM: General appearance: alert and no distress Neck: no adenopathy, no carotid bruit, no JVD, supple, symmetrical, trachea midline and thyroid not enlarged, symmetric, no tenderness/mass/nodules Lungs: clear to auscultation bilaterally Heart: regular rate and rhythm, S1, S2 normal, no murmur, click, rub or gallop Abdomen: soft, non-tender; bowel sounds normal; no masses,  no organomegaly Extremities: extremities normal, atraumatic, no cyanosis or edema Pulses: 2+ and symmetric Skin: Skin color, texture, turgor normal. No rashes or lesions Neurologic: Grossly  normal  EKG: Normal sinus rhythm at 83 with T wave inversions in V3 through V6 suggestive of apical hypertrophic cardiomyopathy  ASSESSMENT: 1. Apical variant hypertrophic arthropathy 2. Mild hypertension - controlled 3. Rheumatoid arthritis-followed by rheumatologist  PLAN: 1.   Alicia Raymond is doing well on losartan, with good blood pressure control and hopefully this will help to allay any worsening in her apical hypertrophic cardiomyopathy. She's had a marked improvement in her symptoms and is able to exercise on a daily basis without any shortness of breath or chest pain. She's had over 15 pound weight loss and feels more energized. This is also helped her RA and she's had a dose reduction in her plaque well. Overall I think she is doing well. We'll need to continue to look for risk factors for coronary disease as well although at this point I feel she is at low risk. Plan to see her back annually or sooner as necessary.   Pixie Casino, MD, [facility] Attending Cardiologist [place] 01/12/2015, 8:54 AM

## 2015-01-12 NOTE — Patient Instructions (Signed)
Dr Debara Pickett recommends that you schedule a follow-up appointment in 1 year. You will receive a reminder letter in the mail two months in advance. If you don't receive a letter, please call our office to schedule the follow-up appointment.

## 2015-02-03 ENCOUNTER — Other Ambulatory Visit: Payer: Self-pay | Admitting: Internal Medicine

## 2015-02-07 ENCOUNTER — Other Ambulatory Visit: Payer: Self-pay | Admitting: Internal Medicine

## 2015-02-09 NOTE — Telephone Encounter (Signed)
REFILL 

## 2015-03-12 ENCOUNTER — Encounter: Payer: Self-pay | Admitting: Family Medicine

## 2015-03-12 ENCOUNTER — Ambulatory Visit (INDEPENDENT_AMBULATORY_CARE_PROVIDER_SITE_OTHER): Payer: 59 | Admitting: Family Medicine

## 2015-03-12 ENCOUNTER — Encounter: Payer: 59 | Admitting: Family Medicine

## 2015-03-12 VITALS — BP 102/62 | HR 68 | Ht 61.0 in | Wt 154.0 lb

## 2015-03-12 DIAGNOSIS — M329 Systemic lupus erythematosus, unspecified: Secondary | ICD-10-CM

## 2015-03-12 DIAGNOSIS — M069 Rheumatoid arthritis, unspecified: Secondary | ICD-10-CM | POA: Diagnosis not present

## 2015-03-12 DIAGNOSIS — Z0000 Encounter for general adult medical examination without abnormal findings: Secondary | ICD-10-CM

## 2015-03-12 DIAGNOSIS — I10 Essential (primary) hypertension: Secondary | ICD-10-CM | POA: Diagnosis not present

## 2015-03-12 DIAGNOSIS — I422 Other hypertrophic cardiomyopathy: Secondary | ICD-10-CM | POA: Diagnosis not present

## 2015-03-12 LAB — POCT URINALYSIS DIPSTICK
Bilirubin, UA: NEGATIVE
Blood, UA: NEGATIVE
Glucose, UA: NEGATIVE
Ketones, UA: NEGATIVE
Leukocytes, UA: NEGATIVE
Nitrite, UA: NEGATIVE
Protein, UA: NEGATIVE
Spec Grav, UA: 1.01
Urobilinogen, UA: NEGATIVE
pH, UA: 6

## 2015-03-12 NOTE — Patient Instructions (Signed)

## 2015-03-12 NOTE — Progress Notes (Signed)
Chief Complaint  Patient presents with  . Annual Exam    nonfasting annual exam with pelvic, pap done last year. Did not do eye exam as she had one with Dr. [place]. No concerns.    Alicia Raymond is a 59 y.o. female who presents for a complete physical.  She has the following concerns:  Apical hypertrophic cardiomyopathy and hypertension: She is under the care of Dr. Debara Pickett, last seen in August 2016. She is on low dose losartan to help prevent any worsening of cardiomyopathy. BP has been well controlled. Denies any problems--no chest pain, shortness of breath, palpitations, or side effects to medications. Denies lightheadedness, dizziness, syncope, edema.  RA and lupus: She sees Dr. Estanislado Pandy. 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 Denies any rashes. She gets bloodwork yearly from Dr. Estanislado Pandy.   Immunization History  Administered Date(s) Administered  . Influenza Split 04/03/2012, 02/08/2013, 02/12/2014  . Influenza-Unspecified 02/13/2015  . Pneumococcal Polysaccharide-23 02/08/2013  . Tdap 02/12/2014  . Zoster 08/26/2014   Last Pap smear: 02/2014, normal, no high risk HPV Last mammogram: 10/2014 Last colonoscopy: UTD per pt. She just had another colonoscopy recently; reports having had polyp on last check (probably 5 years ago)--Dr. Benson Norway. No results from their office are in the chart. Last DEXA:  09/2014 T-1.6 (prior was 12/2012 T-1.3) Dentist: twice yearly Ophtho: yearly ([facility]) Exercise: gets 15-20K steps each day.  She walks and/or jogs on the treadmill daily for an hour. Uses dumbbells daily (2-5#, usually just 2 pounds when walking) Vitamin D-OH was 45 last year (02/2014) Lipids: Lab Results  Component Value Date   CHOL 219* 03/10/2014   HDL 104 03/10/2014   LDLCALC 102* 03/10/2014   TRIG 65 03/10/2014   CHOLHDL 2.1 03/10/2014   Past Medical History  Diagnosis Date  . Lupus  ([place])     DISCOID  . Rheumatoid arthritis(714.0)     Dr. Estanislado Pandy  . Hypertension     Dr. Debara Pickett  . Systemic lupus ([place]) 11/30/09    (pt denies systemic)  . Apical variant hypertrophic cardiomyopathy (HCC)     Dr. Debara Pickett  . Colon polyp     Past Surgical History  Procedure Laterality Date  . Transthoracic echocardiogram  03/2010    EF=>55%, apical hypertrophy, mod conc LVH; mild MR; trace TR with normal RVSP  . Cardiac catheterization  04/12/2010    LAD with 30% narrowing - minimal CAD (Dr. Alma Friendly)  . Tubal ligation    . Breast biopsy Right 1988    fibroadenoma (benign)    Social History   Social History  . Marital Status: Married    Spouse Name: N/A  . Number of Children: 3  . Years of Education: N/A   Occupational History  . customer service rep    Social History Main Topics  . Smoking status: Never Smoker   . Smokeless tobacco: Never Used  . Alcohol Use: Yes     Comment: 1-3 times/week, 1 glass of red wine  . Drug Use: No  . Sexual Activity:    Partners: Male   Other Topics Concern  . Not on file   Social History Narrative   Married.  Lives with her husband.  Oldest daughter lives in [place], son in [place], [place] and daughter in [place].  5 grandchildren.  (lost 1 daughter at the age of 2.5 yo due to illness)    Family  History  Problem Relation Age of Onset  . Lung cancer Father   . Cancer Father   . COPD Brother   . Hyperlipidemia Brother   . Other Sister     brain tumor  . Hypertension Mother   . Hyperlipidemia Son   . Diabetes Neg Hx   . Colon cancer Neg Hx   . Breast cancer Neg Hx   . Cancer Sister     stomach cancer    Outpatient Encounter Prescriptions as of 03/12/2015  Medication Sig  . CALCIUM PO Take 500 mg by mouth daily.  . Cholecalciferol (VITAMIN D PO) Take 1,000 Units by mouth daily.   . hydroxychloroquine (PLAQUENIL) 200 MG tablet Take 200 mg by mouth daily.   Marland Kitchen losartan (COZAAR) 50 MG tablet TAKE 1 TABLET (50 MG TOTAL) BY MOUTH  DAILY. <PLEASE MAKE APPOINTMENT FOR REFILLS>  . Omega-3 Fatty Acids (FISH OIL) 1000 MG CAPS Take 1 capsule by mouth daily.   No facility-administered encounter medications on file as of 03/12/2015.    Allergies  Allergen Reactions  . Septra [Bactrim] Swelling    Couldn't breathe, lip swelling  . Asa [Aspirin] Rash    Upset stomach   ROS: The patient denies anorexia, fever, headaches, vision changes, decreased hearing, ear pain, sore throat, breast concerns, chest pain, palpitations, dizziness, syncope, dyspnea on exertion, cough, swelling, nausea, vomiting, diarrhea, constipation, abdominal pain, melena, hematochezia, indigestion/heartburn, hematuria, incontinence, dysuria, postmenopausal bleeding, vaginal discharge, odor or itch, genital lesions, joint pains, weakness, tremor, suspicious skin lesions, abnormal bleeding/bruising, or enlarged lymph nodes. Ringing in her right ear (not new); denies hearing loss. Some stress this time of year related to fiscal year ending, but didn't bother her much this year (previously had some trouble sleeping, feeling a little down.) Numbness/tingling in her left 3rd and 4th toes that she had for a year has resolved (related to trauma).  14# weight loss since last physical 1 year ago Some hot flashes--overall improved. No longer having night sweats.   +mild vaginal dryness.  PHYSICAL EXAM:  BP 102/62 mmHg  Pulse 68  Ht 5\' 1"  (1.549 m)  Wt 154 lb (69.854 kg)  BMI 29.11 kg/m2  General Appearance:   Alert, cooperative, no distress, appears stated age  Head:   Normocephalic, without obvious abnormality, atraumatic  Eyes:   PERRL, conjunctiva/corneas clear, EOM's intact, fundi   benign  Ears:   Normal TM's and external ear canals  Nose:  Nares normal, mucosa normal, no drainage or sinus tenderness  Throat:  Lips, mucosa, and tongue normal; teeth and gums normal  Neck:  Supple, no lymphadenopathy; thyroid: no  enlargement/tenderness/nodules; no carotid  bruit or JVD  Back:  Spine nontender, no curvature, ROM normal, no CVA tenderness  Lungs:   Clear to auscultation bilaterally without wheezes, rales or ronchi; respirations unlabored  Chest Wall:   No tenderness or deformity  Heart:   Regular rate and rhythm, S1 and S2 normal, no murmur, rub  or gallop  Breast Exam:   No tenderness, masses, or nipple discharge or inversion. No axillary lymphadenopathy. WHSS right upper breast  Abdomen:   Soft, non-tender, nondistended, normoactive bowel sounds,   no masses, no hepatosplenomegaly  Genitalia:   Normal external genitalia without lesions. BUS and vagina normal; no cervical motion tenderness. No abnormal vaginal discharge. Uterus and adnexa not enlarged, nontender, no masses. Pap not performed  Rectal:   Normal tone, no masses or tenderness; no stool in vault to heme-test  Extremities:  No  clubbing, cyanosis or edema  Pulses:  2+ and symmetric all extremities  Skin:  Skin color, texture, turgor normal, no rashes or lesions  Lymph nodes:  Cervical, supraclavicular, and axillary nodes normal  Neurologic:  CNII-XII intact, normal strength, gait; reflexes 2+ and symmetric throughout.      Psych: Normal mood, affect, hygiene and grooming          Chemistry      Component Value Date/Time   NA 138 09/11/2014 0001   K 4.2 09/11/2014 0001   CL 102 09/11/2014 0001   CO2 29 09/11/2014 0001   BUN 18 09/11/2014 0001   CREATININE 0.88 09/11/2014 0001      Component Value Date/Time   CALCIUM 9.0 09/11/2014 0001   ALKPHOS 47 09/11/2014 0001   AST 26 09/11/2014 0001   ALT 20 09/11/2014 0001   BILITOT 0.4 09/11/2014 0001     Lab Results  Component Value Date   WBC 5.5 09/11/2014   HGB 13.5 09/11/2014   HCT 41.7 09/11/2014   MCV 90.3 09/11/2014   PLT 220 09/11/2014   ASSESSMENT/PLAN:  Annual physical exam -  Plan: POCT Urinalysis Dipstick, TSH  Apical variant hypertrophic cardiomyopathy (HCC) - stable; continue yearly follow-up with Dr. Debara Pickett  Essential hypertension - controlled.   Lupus (HCC) - discoid. well controlled  Rheumatoid arthritis involving right hand, unspecified rheumatoid factor presence ([place]) - right pinkie.  In remission, doing very well on Plaquenil. monitored by Dr. Estanislado Pandy.    Check TSH--all other labs were checked (and normal) within the last 6 months. Also checked by Dr. Estanislado Pandy (results not known by me).  Lipids were excellent last year.  Osteopenia, mild--encouraged continued weight-bearing exercise.  Reviewed calcium and vitamin D recommendations, continue.  ROR--Dr. Benson Norway (at least for the last 2 colonoscopies)  Discussed monthly self breast exams and yearly mammograms; at least 30 minutes of aerobic activity at least 5 days/week, weight-bearing exercise at least 2x/wk; proper sunscreen use reviewed; healthy diet, including goals of calcium and vitamin D intake and alcohol recommendations (less than or equal to 1 drink/day) reviewed; regular seatbelt use; changing batteries in smoke detectors. Colonoscopy UTD.  Immunizations are UTD.  F/u 1 year, sooner prn.

## 2015-03-13 LAB — TSH: TSH: 0.892 u[IU]/mL (ref 0.350–4.500)

## 2015-04-03 LAB — HM COLONOSCOPY: HM Colonoscopy: NORMAL

## 2015-04-09 ENCOUNTER — Encounter: Payer: Self-pay | Admitting: *Deleted

## 2015-04-10 ENCOUNTER — Encounter: Payer: Self-pay | Admitting: Family Medicine

## 2015-11-14 LAB — HM MAMMOGRAPHY

## 2015-11-17 ENCOUNTER — Encounter: Payer: Self-pay | Admitting: Family Medicine

## 2016-01-08 ENCOUNTER — Encounter: Payer: Self-pay | Admitting: Internal Medicine

## 2016-01-08 ENCOUNTER — Ambulatory Visit (INDEPENDENT_AMBULATORY_CARE_PROVIDER_SITE_OTHER): Payer: 59 | Admitting: Internal Medicine

## 2016-01-08 VITALS — BP 120/68 | HR 64 | Ht 62.0 in | Wt 150.4 lb

## 2016-01-08 DIAGNOSIS — M069 Rheumatoid arthritis, unspecified: Secondary | ICD-10-CM | POA: Diagnosis not present

## 2016-01-08 DIAGNOSIS — I422 Other hypertrophic cardiomyopathy: Secondary | ICD-10-CM | POA: Diagnosis not present

## 2016-01-08 DIAGNOSIS — I10 Essential (primary) hypertension: Secondary | ICD-10-CM

## 2016-01-08 NOTE — Patient Instructions (Addendum)
Your physician has requested that you have an echocardiogram @ [address] - [address]. Echocardiography is a painless test that uses sound waves to create images of your heart. It provides your doctor with information about the size and shape of your heart and how well your heart's chambers and valves are working. This procedure takes approximately one hour. There are no restrictions for this procedure.  Your physician wants you to follow-up in ONE YEAR with Dr. Hilty.  You will receive a reminder letter in the mail two months in advance. If you don't receive a letter, please call our office to schedule the follow-up appointment.   

## 2016-01-08 NOTE — Progress Notes (Signed)
OFFICE NOTE  Chief Complaint:  Routine followup  Primary Care Physician: Vikki Ports, MD  HPI:  Alicia Raymond is a 60 year old female who has a history of apical hypertrophic cardiomyopathy as well as mild hypertension, rheumatoid arthritis and lupus. Overall, those connective tissue disorders have been very well controlled and she has been on losartan, which is hopefully going to be helpful with providing her some prevention from apical hypertrophy. Over the past year, she has stopped exercising, and gained a small amount of weight. She has had a couple of episodes of chest discomfort at night over the past year however they have subsided.  I had the pleasure of seeing Alicia Raymond back in the office today. Overall she's done very well over the past year. She started doing significant exercise almost every morning. Her weight is down now more than 15 pounds to 153. Blood pressure is well controlled at 118/70. She remains on losartan, mostly for apical hypertrophic cardiomyopathy. She's had no chest pain or worsening shortness of breath. She reports her rheumatoid arthritis is better controlled in fact she is on a lower dose of Plaquenil.  01/08/2016  Alicia Raymond returns today for follow-up. She feels that she is doing really well. She lost another 3 pounds and denies any chest pain or worsening shortness of breath. Unfortunately her sister is quite ill in [place] and she is under a lot of stress as she's been traveling and visiting with her. Blood pressure appears excellent today. Her last echocardiogram was in 2011 and has not been reassessed since that time. EKG shows sinus rhythm with LVH and T-wave changes apically and anterolaterally consistent with her prior diagnosis of apical hypertrophic cardiomyopathy. With regards to her RA and lupus those have been well controlled on low-dose plaquenil.  PMHx:  Past Medical History:  Diagnosis Date  . Apical variant hypertrophic  cardiomyopathy (HCC)    Dr. Debara Pickett  . Colon polyp   . Hypertension    Dr. Debara Pickett  . Lupus ([place])    DISCOID  . Rheumatoid arthritis(714.0)    Dr. Estanislado Pandy  . Systemic lupus ([place]) 11/30/09   (pt denies systemic)    Past Surgical History:  Procedure Laterality Date  . BREAST BIOPSY Right 1988   fibroadenoma (benign)  . CARDIAC CATHETERIZATION  04/12/2010   LAD with 30% narrowing - minimal CAD (Dr. Alma Friendly)  . TRANSTHORACIC ECHOCARDIOGRAM  03/2010   EF=>55%, apical hypertrophy, mod conc LVH; mild MR; trace TR with normal RVSP  . TUBAL LIGATION      FAMHx:  Family History  Problem Relation Age of Onset  . Lung cancer Father   . Cancer Father   . COPD Brother   . Hyperlipidemia Brother   . Other Sister     brain tumor  . Hypertension Mother   . Hyperlipidemia Son   . Diabetes Neg Hx   . Colon cancer Neg Hx   . Breast cancer Neg Hx   . Cancer Sister     stomach cancer    SOCHx:   reports that she has never smoked. She has never used smokeless tobacco. She reports that she drinks alcohol. She reports that she does not use drugs.  ALLERGIES:  Allergies  Allergen Reactions  . Septra [Bactrim] Swelling    Couldn't breathe, lip swelling  . Asa [Aspirin] Rash    Upset stomach    ROS: A comprehensive review of systems was negative.  HOME MEDS: Current Outpatient Prescriptions  Medication Sig Dispense Refill  .  CALCIUM PO Take 500 mg by mouth daily.    . Cholecalciferol (VITAMIN D PO) Take 1,000 Units by mouth daily.     . hydroxychloroquine (PLAQUENIL) 200 MG tablet Take 200 mg by mouth daily.     Marland Kitchen losartan (COZAAR) 50 MG tablet TAKE 1 TABLET (50 MG TOTAL) BY MOUTH DAILY. <PLEASE MAKE APPOINTMENT FOR REFILLS> 90 tablet 3  . Omega-3 Fatty Acids (FISH OIL) 1000 MG CAPS Take 1 capsule by mouth daily.     No current facility-administered medications for this visit.     LABS/IMAGING: No results found for this or any previous visit (from the past 48 hour(s)). No  results found.  VITALS: BP 120/68 (BP Location: Right Arm, Patient Position: Sitting, Cuff Size: Normal)   Pulse 64   Ht 5\' 2"  (1.575 m)   Wt 150 lb 6.4 oz (68.2 kg)   BMI 27.51 kg/m   EXAM: General appearance: alert and no distress Neck: no adenopathy, no carotid bruit, no JVD, supple, symmetrical, trachea midline and thyroid not enlarged, symmetric, no tenderness/mass/nodules Lungs: clear to auscultation bilaterally Heart: regular rate and rhythm, S1, S2 normal, no murmur, click, rub or gallop Abdomen: soft, non-tender; bowel sounds normal; no masses,  no organomegaly Extremities: extremities normal, atraumatic, no cyanosis or edema Pulses: 2+ and symmetric Skin: Skin color, texture, turgor normal. No rashes or lesions Neurologic: Grossly normal  EKG: Normal sinus rhythm at 64 with T wave inversions in V3 through V6 suggestive of apical hypertrophic cardiomyopathy  ASSESSMENT: 1. Apical variant hypertrophic arthropathy 2. Mild hypertension - controlled 3. Rheumatoid arthritis-followed by rheumatologist  PLAN: 1.   Alicia Raymond remains asymptomatic in general he feels well. She's lost some additional weight. Her RA is fairly well-controlled. Blood pressure is at goal. I like to obtain another echocardiogram to reassess her apical wall thickness and for any structural changes that may have occurred in her heart in the past 6 years since her last echo.  Follow-up annually or sooner as necessary.  Pixie Casino, MD, [facility] Attending Cardiologist [place] 01/08/2016, 1:26 PM

## 2016-01-19 ENCOUNTER — Other Ambulatory Visit: Payer: Self-pay

## 2016-01-19 ENCOUNTER — Ambulatory Visit (HOSPITAL_COMMUNITY): Payer: 59 | Attending: Cardiology

## 2016-01-19 DIAGNOSIS — I119 Hypertensive heart disease without heart failure: Secondary | ICD-10-CM | POA: Insufficient documentation

## 2016-01-19 DIAGNOSIS — I340 Nonrheumatic mitral (valve) insufficiency: Secondary | ICD-10-CM | POA: Diagnosis not present

## 2016-01-19 DIAGNOSIS — I422 Other hypertrophic cardiomyopathy: Secondary | ICD-10-CM | POA: Diagnosis not present

## 2016-01-19 DIAGNOSIS — I429 Cardiomyopathy, unspecified: Secondary | ICD-10-CM | POA: Diagnosis present

## 2016-03-11 NOTE — Progress Notes (Deleted)
Alicia Raymond is a 60 y.o. female who presents for a complete physical.  She has the following concerns:  Apical hypertrophic cardiomyopathy and hypertension: She is under the care of Dr. Debara Pickett, last seen in August 2017. She remains on low dose losartan to help prevent any worsening of cardiomyopathy.  She had echocardiogram repeated, and things were stable. BP has been well controlled. Denies any problems--no chest pain, shortness of breath, palpitations, or side effects to medications. Denies lightheadedness, dizziness, syncope, edema.  RA and lupus: She sees Dr. Estanislado Pandy. 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 Denies any rashes. She gets bloodwork yearly from Dr. Estanislado Pandy.  Immunization History  Administered Date(s) Administered  . Influenza Split 04/03/2012, 02/08/2013, 02/12/2014  . Influenza-Unspecified 02/13/2015, 02/18/2016  . Pneumococcal Polysaccharide-23 02/08/2013  . Tdap 02/12/2014  . Zoster 08/26/2014   Last Pap smear: 02/2014, normal, no high risk HPV Last mammogram: 10/2015 Last colonoscopy: 04/2014--tubular adenoma, repeat 5 years (Dr. Benson Norway). Last DEXA:  09/2014 T-1.6 (prior was 12/2012 T-1.3) Dentist: twice yearly Ophtho: yearly ([facility]) Exercise: gets 15-20K steps each day.  She walks and/or jogs on the treadmill daily for an hour. Uses dumbbells daily (2-5#, usually just 2 pounds when walking) Vitamin D-OH was 45 in 02/2014 Lipids: Lab Results  Component Value Date   CHOL 219 (H) 03/10/2014   HDL 104 03/10/2014   LDLCALC 102 (H) 03/10/2014   TRIG 65 03/10/2014   CHOLHDL 2.1 03/10/2014     ROS: The patient denies anorexia, fever, headaches, vision changes, decreased hearing, ear pain, sore throat, breast concerns, chest pain, palpitations, dizziness, syncope, dyspnea on exertion, cough, swelling, nausea, vomiting, diarrhea, constipation, abdominal pain,  melena, hematochezia, indigestion/heartburn, hematuria, incontinence, dysuria, postmenopausal bleeding, vaginal discharge, odor or itch, genital lesions, joint pains, weakness, tremor, suspicious skin lesions, abnormal bleeding/bruising, or enlarged lymph nodes. Ringing in her right ear (not new); denies hearing loss. Some stress this time of year related to fiscal year ending, but didn't bother her much this year (previously had some trouble sleeping, feeling a little down.) Numbness/tingling in her left 3rd and 4th toes that she had for a year has resolved (related to trauma).  Some hot flashes--overall improved. No longer having night sweats.   +mild vaginal dryness.  Weight loss  PHYSICAL EXAM:  Weight 154# last year  General Appearance:   Alert, cooperative, no distress, appears stated age  Head:   Normocephalic, without obvious abnormality, atraumatic  Eyes:   PERRL, conjunctiva/corneas clear, EOM's intact, fundi   benign  Ears:   Normal TM's and external ear canals  Nose:  Nares normal, mucosa normal, no drainage or sinus tenderness  Throat:  Lips, mucosa, and tongue normal; teeth and gums normal  Neck:  Supple, no lymphadenopathy; thyroid: no enlargement/tenderness/nodules; no carotid bruit or JVD  Back:  Spine nontender, no curvature, ROM normal, no CVA tenderness  Lungs:   Clear to auscultation bilaterally without wheezes, rales orronchi; respirations unlabored  Chest Wall:   No tenderness or deformity  Heart:   Regular rate and rhythm, S1 and S2 normal, no murmur, rub  or gallop  Breast Exam:   No tenderness, masses, or nipple discharge or inversion.No axillary lymphadenopathy. WHSS right upper breast  Abdomen:   Soft, non-tender, nondistended, normoactive bowel sounds,   no masses, no hepatosplenomegaly  Genitalia:   Normal external genitalia without lesions. BUS and vagina normal; no cervical motion tenderness.  No  abnormal vaginal discharge. Uterus and adnexa not enlarged, nontender, no masses. Pap not performed  Rectal:   Normal tone, no masses or tenderness; no stool in vault to heme-test  Extremities:  No clubbing, cyanosis or edema  Pulses:  2+ and symmetric all extremities  Skin:  Skin color, texture, turgor normal, no rashes or lesions  Lymph nodes:  Cervical, supraclavicular, and axillary nodes normal  Neurologic:  CNII-XII intact, normal strength, gait; reflexes 2+ and symmetric throughout.      Psych: Normal mood, affect, hygiene and grooming    ASSESSMENT/PLAN:   Osteopenia, mild--encouraged continued weight-bearing exercise.  Reviewed calcium and vitamin D recommendations, continue.  ROR--Dr. Benson Norway (at least for the last 2 colonoscopies)  Discussed monthly self breast exams and yearly mammograms; at least 30 minutes of aerobic activity at least 5 days/week, weight-bearing exercise at least 2x/wk; proper sunscreen use reviewed; healthy diet, including goals of calcium and vitamin D intake and alcohol recommendations (less than or equal to 1 drink/day) reviewed; regular seatbelt use; changing batteries in smoke detectors. Colonoscopy UTD.  Immunizations are UTD.

## 2016-03-14 ENCOUNTER — Encounter: Payer: 59 | Admitting: Family Medicine

## 2016-03-25 DIAGNOSIS — I499 Cardiac arrhythmia, unspecified: Secondary | ICD-10-CM | POA: Insufficient documentation

## 2016-03-25 DIAGNOSIS — M19041 Primary osteoarthritis, right hand: Secondary | ICD-10-CM | POA: Insufficient documentation

## 2016-03-25 DIAGNOSIS — M19042 Primary osteoarthritis, left hand: Secondary | ICD-10-CM | POA: Insufficient documentation

## 2016-03-25 DIAGNOSIS — L930 Discoid lupus erythematosus: Secondary | ICD-10-CM | POA: Insufficient documentation

## 2016-03-25 NOTE — Progress Notes (Deleted)
*IMAGE* Office Visit Note  Patient: Alicia Raymond             Date of Birth: 20-Mar-1956           MRN: [id number]             PCP: Vikki Ports, MD Referring: Rita Ohara, MD Visit Date: 03/28/2016 Occupation:@GUAROCC @    Subjective:  No chief complaint on file.   History of Present Illness: Alicia Raymond is a 60 y.o. female ***   Activities of Daily Living:  Patient reports morning stiffness for *** {minute/hour:19697}.   Patient {ACTIONS;DENIES/REPORTS:21021675::"Denies"} nocturnal pain.  Difficulty dressing/grooming: {ACTIONS;DENIES/REPORTS:21021675::"Denies"} Difficulty climbing stairs: {ACTIONS;DENIES/REPORTS:21021675::"Denies"} Difficulty getting out of chair: {ACTIONS;DENIES/REPORTS:21021675::"Denies"} Difficulty using hands for taps, buttons, cutlery, and/or writing: {ACTIONS;DENIES/REPORTS:21021675::"Denies"}   No Rheumatology ROS completed.   PMFS History:  Patient Active Problem List   Diagnosis Date Noted  . 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 Primary osteoarthritis of both hands 03/25/2016  . Arrhythmia 03/25/2016  . Atypical chest pain 11/21/2013  . Apical variant hypertrophic cardiomyopathy ([place]) 11/09/2012  . HTN (hypertension) 11/09/2012  . Lupus 11/09/2012    Past Medical History:  Diagnosis Date  . Apical variant hypertrophic cardiomyopathy (HCC)    Dr. Debara Pickett  . Colon polyp   . Hypertension    Dr. Debara Pickett  . Lupus    DISCOID  . Rheumatoid arthritis(714.0)    Dr. Estanislado Pandy  . Systemic lupus ([place]) 11/30/09   (pt denies systemic)    Family History  Problem Relation Age of Onset  . Lung cancer Father   . Cancer Father   . COPD Brother   . Hyperlipidemia Brother   . Other Sister     brain tumor  . Hypertension Mother   . Hyperlipidemia Son   . Diabetes Neg Hx   . Colon cancer Neg Hx   . Breast cancer Neg Hx   . Cancer Sister     stomach cancer   Past Surgical History:  Procedure Laterality Date  . BREAST BIOPSY Right 1988   fibroadenoma (benign)  . CARDIAC CATHETERIZATION  04/12/2010   LAD with 30% narrowing - minimal CAD (Dr. Alma Friendly)  . TRANSTHORACIC ECHOCARDIOGRAM  03/2010   EF=>55%, apical hypertrophy, mod conc LVH; mild MR; trace TR with normal RVSP  . TUBAL LIGATION     Social History   Social History Narrative   Married.  Lives with her husband.  Oldest daughter lives in [place], son in [place], [place] and daughter in [place].  5 grandchildren.  (lost 1 daughter at the age of 2.5 yo due to illness)     Objective: Vital Signs: There were no vitals taken for this visit.   Physical Exam   Musculoskeletal Exam: ***  CDAI Exam: No CDAI exam completed.    Investigation: Findings:  Pneumococcal vaccine 02/08/2013 10/22/2015 CMP normal CBC normal    Imaging: No results found.  Speciality Comments: No specialty comments available.    Procedures:  No procedures performed Allergies: Septra [bactrim] and Asa [aspirin]   Assessment / Plan: Visit Diagnoses: Discoid lupus - Arthralgias, photosensitivity, Raynaud's  High risk medication use  Primary osteoarthritis of both hands  Primary osteoarthritis of both feet  Cardiac arrhythmia, unspecified cardiac arrhythmia type    Orders: No orders of the defined types were placed in this encounter.  No orders of the defined types were placed in this encounter.   Face-to-face time spent with patient was *** minutes. 50% of time was spent in counseling and coordination  of care.  Follow-Up Instructions: No Follow-up on file.

## 2016-03-28 ENCOUNTER — Ambulatory Visit: Payer: 59 | Admitting: Rheumatology

## 2016-03-29 ENCOUNTER — Other Ambulatory Visit: Payer: Self-pay | Admitting: Internal Medicine

## 2016-03-29 NOTE — Telephone Encounter (Signed)
Rx(s) sent to pharmacy electronically.  

## 2016-05-12 ENCOUNTER — Ambulatory Visit: Payer: 59 | Admitting: Rheumatology

## 2016-05-18 ENCOUNTER — Encounter: Payer: Self-pay | Admitting: Family Medicine

## 2016-05-18 ENCOUNTER — Ambulatory Visit (INDEPENDENT_AMBULATORY_CARE_PROVIDER_SITE_OTHER): Payer: 59 | Admitting: Family Medicine

## 2016-05-18 ENCOUNTER — Ambulatory Visit
Admission: RE | Admit: 2016-05-18 | Discharge: 2016-05-18 | Disposition: A | Discharge status: Home / Self Care | Payer: 59 | Source: Ambulatory Visit | Attending: Family Medicine | Admitting: Family Medicine

## 2016-05-18 VITALS — BP 120/80 | HR 74 | Wt 156.4 lb

## 2016-05-18 DIAGNOSIS — M25561 Pain in right knee: Secondary | ICD-10-CM

## 2016-05-18 NOTE — Patient Instructions (Signed)
Try Tylenol arthritis and topical analgesic as you have been. Rest, elevation and ice are all ok as well for pain control.  We will call you with your XR result. Follow up as scheduled on January 8th with your rheumatologist.

## 2016-05-18 NOTE — Progress Notes (Signed)
   Subjective:    Patient ID: Alicia Raymond, female    DOB: 1956-01-01, 60 y.o.   MRN: [id number]  HPI Chief Complaint  Patient presents with  . knee pain    knee pain- couple weeks. can't stand straight up on it. doesn't remember hitting or falling on it   She is here with complaints of a 2 week history of right anterior and medial knee pain and mild swelling. Denies injury or history of right knee issues but states she drove to and from [place] prior to onset of knee pain. Pain feels like a stabbing below her patella and is worse with walking and relieved somewhat with rest. Also reports pain to anterior region when straightening knee. Denies redness, warmth, numbness, tingling, weakness.  Denies locking, popping or giving away. Denies calf pain or tenderness. No claudication.  States she has taken Ibuprofen and Tylenol with temporary relief. States she has been elevating her knee at night and currently has a analgesic patch in place.   Denies fever, chills, chest pain, palpitations, cough, back pain, abdominal pain, GI or GU symptoms. No swelling to feet or ankles.   History of RA this is being managed by Dr. Bronson Curb. Also has history of Lupus and osteoarthritis.  Denies history of blood clot.  Reviewed allergies, medications, past medical, surgical, and social history.   Review of Systems Pertinent positives and negatives in the history of present illness.     Objective:   Physical Exam  Constitutional: She is oriented to person, place, and time. She appears well-developed and well-nourished. No distress.  Cardiovascular: Normal rate, regular rhythm, normal heart sounds and intact distal pulses.   Pulmonary/Chest: Effort normal and breath sounds normal.  Musculoskeletal:       Right knee: She exhibits decreased range of motion and swelling. She exhibits no erythema and no LCL laxity. Tenderness found. Medial joint line tenderness noted.  No erythema or warmth. Negative  Mcmurrays.  Neurological: She is alert and oriented to person, place, and time. She has normal strength. No sensory deficit. Gait abnormal.  Antalgic gait  Skin: Skin is warm and dry. No rash noted. No erythema. No pallor.  Psychiatric: She has a normal mood and affect. Her behavior is normal. Thought content normal.   BP 120/80   Pulse 74   Wt 156 lb 6.4 oz (70.9 kg)   BMI 28.61 kg/m       Assessment & Plan:  Knee pain, right anterior - Plan: DG Knee Complete 4 Views Right  Discussed that her knee does not appear infectious. She follows closely with rheumatologist and RA appears well managed. History and exam are not suspicious for DVT.  Plan to send her for an XR and try conservative treatment. Discussed RICE. She may also continue using topical analgesic and may try Tylenol arthritis.  Will follow up pending XR result. She has an appointment with rheumatologist on January 8th.

## 2016-05-30 ENCOUNTER — Encounter: Payer: Self-pay | Admitting: Rheumatology

## 2016-05-30 ENCOUNTER — Ambulatory Visit (INDEPENDENT_AMBULATORY_CARE_PROVIDER_SITE_OTHER): Payer: 59 | Admitting: Rheumatology

## 2016-05-30 VITALS — BP 120/84 | HR 62 | Resp 14 | Ht 62.0 in | Wt 162.0 lb

## 2016-05-30 DIAGNOSIS — Z79899 Other long term (current) drug therapy: Secondary | ICD-10-CM | POA: Diagnosis not present

## 2016-05-30 DIAGNOSIS — M19072 Primary osteoarthritis, left ankle and foot: Secondary | ICD-10-CM | POA: Diagnosis not present

## 2016-05-30 DIAGNOSIS — M19042 Primary osteoarthritis, left hand: Secondary | ICD-10-CM | POA: Diagnosis not present

## 2016-05-30 DIAGNOSIS — L930 Discoid lupus erythematosus: Secondary | ICD-10-CM | POA: Diagnosis not present

## 2016-05-30 DIAGNOSIS — M19071 Primary osteoarthritis, right ankle and foot: Secondary | ICD-10-CM | POA: Diagnosis not present

## 2016-05-30 DIAGNOSIS — M19041 Primary osteoarthritis, right hand: Secondary | ICD-10-CM

## 2016-05-30 LAB — CBC WITH DIFFERENTIAL/PLATELET
Basophils Absolute: 64 cells/uL (ref 0–200)
Basophils Relative: 1 %
Eosinophils Absolute: 384 cells/uL (ref 15–500)
Eosinophils Relative: 6 %
HCT: 42.3 % (ref 35.0–45.0)
Hemoglobin: 13.4 g/dL (ref 11.7–15.5)
Lymphocytes Relative: 43 %
Lymphs Abs: 2752 cells/uL (ref 850–3900)
MCH: 29.3 pg (ref 27.0–33.0)
MCHC: 31.7 g/dL — ABNORMAL LOW (ref 32.0–36.0)
MCV: 92.6 fL (ref 80.0–100.0)
MPV: 10.5 fL (ref 7.5–12.5)
Monocytes Absolute: 640 cells/uL (ref 200–950)
Monocytes Relative: 10 %
Neutro Abs: 2560 cells/uL (ref 1500–7800)
Neutrophils Relative %: 40 %
Platelets: 242 10*3/uL (ref 140–400)
RBC: 4.57 MIL/uL (ref 3.80–5.10)
RDW: 14 % (ref 11.0–15.0)
WBC: 6.4 10*3/uL (ref 3.8–10.8)

## 2016-05-30 LAB — COMPLETE METABOLIC PANEL WITH GFR
ALT: 32 U/L — ABNORMAL HIGH (ref 6–29)
AST: 30 U/L (ref 10–35)
Albumin: 3.9 g/dL (ref 3.6–5.1)
Alkaline Phosphatase: 42 U/L (ref 33–130)
BUN: 18 mg/dL (ref 7–25)
CO2: 26 mmol/L (ref 20–31)
Calcium: 9.5 mg/dL (ref 8.6–10.4)
Chloride: 105 mmol/L (ref 98–110)
Creat: 0.83 mg/dL (ref 0.50–0.99)
GFR, Est African American: 87 mL/min (ref >=60)
GFR, Est Non African American: 76 mL/min (ref >=60)
Glucose, Bld: 95 mg/dL (ref 65–99)
Potassium: 4.7 mmol/L (ref 3.5–5.3)
Sodium: 139 mmol/L (ref 135–146)
Total Bilirubin: 0.5 mg/dL (ref 0.2–1.2)
Total Protein: 7.2 g/dL (ref 6.1–8.1)

## 2016-05-30 MED ORDER — HYDROXYCHLOROQUINE SULFATE 200 MG PO TABS: 200.0000 mg | ORAL_TABLET | Freq: Every day | ORAL | 1 refills | Status: AC

## 2016-05-30 NOTE — Patient Instructions (Addendum)

## 2016-05-30 NOTE — Progress Notes (Signed)
Office Visit Note  Patient: Alicia Raymond             Date of Birth: 1956-01-06           MRN: [id number]             PCP: Vikki Ports, MD Referring: Rita Ohara, MD Visit Date: 05/30/2016 Occupation: @GUAROCC @    Subjective:  Pain of the Right Ankle (Injury 1 m ago better now with ibuprofen); Pain of the Right Knee; and Follow-up   History of Present Illness: Alicia Raymond is a 61 y.o. 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 No flare since the last visit. She does have a history of Raynaud's.  Patient has been taking Plaquenil 200 mg daily as prescribed. Tolerating medication well. Good adequate response  About 2 months ago she had right knee joint pain which she rated 10 on a scale of 0-10. She did not fall or have any injuries however she's had 3 deaths in the family over the last 2 months. She had to travel to several different places including [place] area she was immobile for long period of time. She may have had stiffness in her knee that got worse as a result of that immobility. She did see her PCP and they ruled out a DVT according to the patient. They did do x-rays which I was able to see the reading on Epic and it showed a normal right knee. F today the knee is rated 0 on a scale of 0-10.    Activities of Daily Living:  Patient reports morning stiffness for 15 minutes.   Patient Denies nocturnal pain.  Difficulty dressing/grooming: Denies Difficulty climbing stairs: Denies Difficulty getting out of chair: Denies Difficulty using hands for taps, buttons, cutlery, and/or writing: Denies   Review of Systems  Constitutional: Negative for fatigue.  HENT: Negative for mouth sores and mouth dryness.   Eyes: Negative for dryness.  Respiratory: Negative for shortness of breath.   Gastrointestinal: Negative for constipation and diarrhea.  Musculoskeletal: Negative for myalgias and myalgias.  Skin:  Negative for sensitivity to sunlight.  Psychiatric/Behavioral: Negative for decreased concentration and sleep disturbance.    PMFS History:  Patient Active Problem List   Diagnosis Date Noted  . 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 Primary osteoarthritis of both hands 03/25/2016  . Arrhythmia 03/25/2016  . Atypical chest pain 11/21/2013  . Apical variant hypertrophic cardiomyopathy ([place]) 11/09/2012  . HTN (hypertension) 11/09/2012  . Lupus 11/09/2012    Past Medical History:  Diagnosis Date  . Apical variant hypertrophic cardiomyopathy (HCC)    Dr. Debara Pickett  . Colon polyp   . Hypertension    Dr. Debara Pickett  . Lupus    DISCOID  . Rheumatoid arthritis(714.0)    Dr. Estanislado Pandy  . Systemic lupus ([place]) 11/30/09   (pt denies systemic)    Family History  Problem Relation Age of Onset  . Lung cancer Father   . Cancer Father   . COPD Brother   . Hyperlipidemia Brother   . Other Sister     brain tumor  . Hypertension Mother   . Hyperlipidemia Son   . Diabetes Neg Hx   . Colon cancer Neg Hx   . Breast cancer Neg Hx   . Cancer Sister     stomach cancer   Past Surgical History:  Procedure Laterality Date  . BREAST BIOPSY Right 1988   fibroadenoma (benign)  . CARDIAC  CATHETERIZATION  04/12/2010   LAD with 30% narrowing - minimal CAD (Dr. Alma Friendly)  . TRANSTHORACIC ECHOCARDIOGRAM  03/2010   EF=>55%, apical hypertrophy, mod conc LVH; mild MR; trace TR with normal RVSP  . TUBAL LIGATION     Social History   Social History Narrative   Married.  Lives with her husband.  Oldest daughter lives in [place], son in [place], [place] and daughter in [place].  5 grandchildren.  (lost 1 daughter at the age of 2.5 yo due to illness)     Objective: Vital Signs: BP 120/84   Pulse 62   Resp 14   Ht 5\' 2"  (1.575 m)   Wt 162 lb (73.5 kg)   BMI 29.63 kg/m    Physical Exam  Constitutional: She is oriented to person, place, and time. She appears well-developed and well-nourished.  HENT:  Head: Normocephalic  and atraumatic.  Eyes: EOM are normal. Pupils are equal, round, and reactive to light.  Cardiovascular: Normal rate, regular rhythm and normal heart sounds.  Exam reveals no gallop and no friction rub.   No murmur heard. Pulmonary/Chest: Effort normal and breath sounds normal. She has no wheezes. She has no rales.  Abdominal: Soft. Bowel sounds are normal. She exhibits no distension. There is no tenderness. There is no guarding. No hernia.  Musculoskeletal: Normal range of motion. She exhibits no edema, tenderness or deformity.  Lymphadenopathy:    She has no cervical adenopathy.  Neurological: She is alert and oriented to person, place, and time. Coordination normal.  Skin: Skin is warm and dry. Capillary refill takes less than 2 seconds. No rash noted.  Psychiatric: She has a normal mood and affect. Her behavior is normal.  Nursing note and vitals reviewed.    Musculoskeletal Exam:  Full range of motion of all joints Grip strength is equal and strong bilaterally Fiber myalgia tender points are all absent  CDAI Exam: CDAI Homunculus Exam:   Joint Counts:  CDAI Tender Joint count: 0 CDAI Swollen Joint count: 0  Global Assessments:  Patient Global Assessment: 0 Provider Global Assessment: 0    Investigation: No additional findings.   Imaging: Dg Knee Complete 4 Views Right  Result Date: 05/18/2016 CLINICAL DATA:  Anterior right knee pain, swelling for a few weeks. EXAM: RIGHT KNEE - COMPLETE 4+ VIEW COMPARISON:  None. FINDINGS: Degenerative spurring in all 3 compartments, most pronounced in the patellofemoral compartment. Joint spaces are maintained. No joint effusion. No acute bony abnormality. Specifically, no fracture, subluxation, or dislocation. Soft tissues are intact. IMPRESSION: Mild degenerative spurring.  No acute findings. Electronically Signed   By: Rolm Baptise M.D.   On: 05/18/2016 11:38   Plan: #1: Refill Plaquenil 200 mg daily ninety-day supply with a  refill I printed the prescription. She can go to [facility] and she can get it from about $52. Note that she was complaining that her previous cost of the medication was approximately $140 which she found to be too expensive for ninety-day supply. She was unaware of good Rx Ovcon and was pleased to note that it might be for $52. I told her that there is no guarantee that [facility] we'll honor the good Rx coupon but she can ask. If she is not successful [facility], Kristopher Oppenheim is willing to give the same medication for $67.  #2 discoid lupus. Biopsy-proven. No flare. Stable.  #3: Labs to be done today that include CBC with differential and CMP with GFR and then we will did again in  about 5-6 months from now.  #6: Plaquenil eye exam is normal and up-to-date according to the patient. Patient has her documented in [facility] with [facility] from 12/29/2015 visit.  Speciality Comments: No specialty comments available.    Procedures:  No procedures performed Allergies: Septra [bactrim] and Asa [aspirin]   Assessment / Plan:     Visit Diagnoses: High risk medications (not anticoagulants) long-term use - Plan: CBC with Differential/Platelet, COMPLETE METABOLIC PANEL WITH GFR    Orders: Orders Placed This Encounter  Procedures  . CBC with Differential/Platelet  . COMPLETE METABOLIC PANEL WITH GFR   Meds ordered this encounter  Medications  . hydroxychloroquine (PLAQUENIL) 200 MG tablet    Sig: Take 1 tablet (200 mg total) by mouth daily.    Dispense:  90 tablet    Refill:  1    Order Specific Question:   Supervising Provider    Answer:   Bo Merino [phone]    Face-to-face time spent with patient was 30 minutes. 50% of time was spent in counseling and coordination of care.  Follow-Up Instructions: No Follow-up on file.   Eliezer Lofts, PA-C  Note - This record has been created using [employer].  Chart creation errors have been sought, but may not always  have been located.  Such creation errors do not reflect on  the standard of medical care.

## 2016-09-28 NOTE — Progress Notes (Signed)
Chief Complaint  Patient presents with  . Annual Exam    fasting annual exam with pap. Did not do eye as she sees eye doctor. Has been having right knee pain x 4 weeks. Also has issue with heading during/after orgasm.     Alicia Raymond is a 61 y.o. female who presents for a complete physical.  She has the following concerns:  Complaining of headaches with orgasm.  Intercourse is infrequent (every 2-3 months)--it is dry, somewhat painful.  Takes a very long time to achieve an orgasm, and when she does, she gets a headache.  Pain at the back of her head. Going on for 1-2 years, and is every single time (had been sporadic at first, now consistent and worsening). Sometimes she has to interrupt intercourse due to the pain. Denies headaches at any other time, never with exercise.  She is under the Raymond of Dr. Debara Raymond for apical variant hypertrophic cardiomyopathy, mild hypertension.  Last saw him in August and no changes were made. Her echo was stable. She continues on on losartan to help prevent any worsening of cardiomyopathy. BP has been well controlled. Denies any problems--no chest pain, shortness of breath, palpitations, or side effects to medications. Denies lightheadedness, dizziness, syncope, edema. Headaches with orgasm as above.  RA and lupus: She sees Dr. Estanislado Raymond. 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 Denies any rashes. She gets bloodwork yearly from Dr. Estanislado Raymond, last in January (and now available in epic). She is complaining of some right knee pain--3-4 weeks ago it was severe, couldn't walk on it for 2 days. She didn't have any swelling. Episode was worse than when she saw Alicia Raymond in December. She was better by the time she saw rheumatologist shortly after. Pain comes and goes.  Hasn't been able to walk on the treadmill.  Has pain with walking. X-rays in December showed degenerative spurring in all 3 compartments,  most pronounced in patellofemoral compartment. Denies giving way, but was unable to bear weight for a couple of days.  She gets some cramping in her calf when driving. Does report some knee locking.  Denies any other joint pains.  Immunization History  Administered Date(s) Administered  . Influenza Split 04/03/2012, 02/08/2013, 02/12/2014  . Influenza-Unspecified 02/13/2015, 02/18/2016  . Pneumococcal Polysaccharide-23 02/08/2013  . Tdap 02/12/2014  . Zoster 08/26/2014   Last Pap smear: 02/2014, normal, no high risk HPV Last mammogram: 10/2015 Last colonoscopy: 04/2014--tubular adenoma. Repeat 5 years Last DEXA:  09/2014 T-1.6 spine (prior was 12/2012 T-1.3) Dentist: twice yearly Ophtho: yearly (Alicia Raymond) Exercise: Hasn't been able to get on treadmill for the last 4 weeks due to knee pain. She has been walking the halls at work, and neighborhood walk on the weekends. (she used to walk and/or jogs on the treadmill daily for an hour). Still using dumbbells daily 5#, at work.  Vitamin D-OH was 45 in 02/2014 Lipids: Lab Results  Component Value Date   CHOL 219 (H) 03/10/2014   HDL 104 03/10/2014   LDLCALC 102 (H) 03/10/2014   TRIG 65 03/10/2014   CHOLHDL 2.1 03/10/2014   Past Medical History:  Diagnosis Date  . Apical variant hypertrophic cardiomyopathy (HCC)    Dr. Debara Raymond  . Colon polyp   . Hypertension    Dr. Debara Raymond  . Lupus    DISCOID  . Rheumatoid arthritis(714.0)    Dr. Estanislado Raymond  . Systemic lupus (Alicia Raymond) 11/30/09   (pt  denies systemic)    Past Surgical History:  Procedure Laterality Date  . BREAST BIOPSY Right 1988   fibroadenoma (benign)  . CARDIAC CATHETERIZATION  04/12/2010   LAD with 30% narrowing - minimal CAD (Dr. Alma Raymond)  . TRANSTHORACIC ECHOCARDIOGRAM  03/2010   EF=>55%, apical hypertrophy, mod conc LVH; mild MR; trace TR with normal RVSP  . TUBAL LIGATION      Social History   Social History  . Marital status: Married    Spouse name: N/A  . Number  of children: 3  . 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 Smoking status: Never Smoker  . Smokeless tobacco: Never Used  . Alcohol use Yes     Comment: 1-3 times/week, 1 glass of red wine  . Drug use: No  . Sexual activity: Yes    Partners: Male   Other Topics Concern  . Not on file   Social History Narrative   Married.  Lives with her husband.  Oldest daughter lives in [place], son and daughter liver in [facility], [place].  5 grandchildren.  (lost 1 daughter at the age of 2.5 yo due to illness)    Family History  Problem Relation Age of Onset  . Lung cancer Father   . Cancer Father   . COPD Brother   . Hyperlipidemia Brother   . Other Sister        brain tumor (benign)  . Hypertension Mother   . Hyperlipidemia Son   . Cancer Sister        stomach cancer  . Cancer Sister 56       metastatic (in abdomen), unknown primary, died 2 mos later  . Diabetes Neg Hx   . Colon cancer Neg Hx   . Breast cancer Neg Hx     Outpatient Encounter Prescriptions as of 09/29/2016  Medication Sig  . CALCIUM PO Take 500 mg by mouth daily.  . Cholecalciferol (VITAMIN D PO) Take 1,000 Units by mouth daily.   Marland Kitchen glucosamine-chondroitin 500-400 MG tablet Take 1 tablet by mouth daily.  . hydroxychloroquine (PLAQUENIL) 200 MG tablet Take 200 mg by mouth daily.  Marland Kitchen losartan (COZAAR) 50 MG tablet Take 1 tablet (50 mg total) by mouth daily.  . Omega-3 Fatty Acids (FISH OIL) 1000 MG CAPS Take 1 capsule by mouth daily.   No facility-administered encounter medications on file as of 09/29/2016.     Allergies  Allergen Reactions  . Septra [Bactrim] Swelling    Couldn't breathe, lip swelling  . Asa [Aspirin] Rash    Upset stomach   ROS: The patient denies anorexia, fever, vision changes, decreased hearing, ear pain, sore throat, breast concerns, chest pain, dizziness, syncope, dyspnea on exertion, cough, swelling, nausea,  vomiting, diarrhea, constipation, abdominal pain, melena, hematochezia, indigestion/heartburn, hematuria, incontinence, dysuria, postmenopausal bleeding, vaginal discharge, odor or itch, genital lesions, weakness, tremor, suspicious skin lesions, abnormal bleeding/bruising, or enlarged lymph nodes. Ringing in her right ear (not new); denies hearing loss. Some hot flashes tolerable.  +mild vaginal dryness. No significant weight changes. Right knee pain x 1 month Orgasmic headaches per HPI Palpitations, often while at work, not stressed, no change in caffeine. Thinks that sometimes she notices her heart beating fast.  No associated dizziness, chest pain.   PHYSICAL EXAM:  BP 120/82 (BP Location: Left Arm, Patient Position: Sitting, Cuff Size: Normal)   Pulse 68   Ht 5' 1.5" (  1.562 m)   Wt 155 lb 3.2 oz (70.4 kg)   BMI 28.85 kg/m   Wt Readings from Last 3 Encounters:  09/29/16 155 lb 3.2 oz (70.4 kg)  05/30/16 162 lb (73.5 kg)  05/18/16 156 lb 6.4 oz (70.9 kg)    General Appearance:   Alert, cooperative, no distress, appears stated age  Head:   Normocephalic, without obvious abnormality, atraumatic  Eyes:   PERRL, conjunctiva/corneas clear, EOM's intact, fundi benign  Ears:   Normal TM's and external ear canals  Nose:  Nares normal, mucosa normal, no drainage or sinus tenderness  Throat:  Lips, mucosa, and tongue normal; teeth and gums normal  Neck:  Supple, no lymphadenopathy; thyroid: no enlargement/tenderness/nodules; no carotid bruit or JVD  Back:  Spine nontender, no curvature, ROM normal, no CVAtenderness  Lungs:   Clear to auscultation bilaterally without wheezes, rales or ronchi; respirations unlabored  Chest Wall:   No tenderness or deformity  Heart:   Regular rate and rhythm, S1 and S2 normal, no murmur, rub or gallop  Breast Exam:   No tenderness, masses, or nipple discharge or inversion.No axillary lymphadenopathy. WHSS right  upper breast  Abdomen:   Soft, non-tender, nondistended, normoactive bowel sounds, no masses, no hepatosplenomegaly  Genitalia:   Normal external genitalia without lesions. BUS and vagina normal; no cervical motion tenderness. No abnormal vaginal discharge. Uterus and adnexa not enlarged, nontender, no masses. Pap not performed  Rectal:   Normal tone, no masses or tenderness; no stool in vault to heme-test  Extremities:  No clubbing, cyanosis or edema. FROM R knee with crepitus at superolateral aspectt.  nontender at joint lines, medial and laterally.  She has some discomfort and slight swelling posteriorly (lateral) at knee. No effusion or warmth.  Negative Lachman, McMurray.   Pulses:  2+ and symmetric all extremities  Skin:  Skin color, texture, turgor normal, no rashes or lesions  Lymph nodes:  Cervical, supraclavicular, and axillary nodes normal  Neurologic:  CNII-XII intact, normal strength, gait; reflexes 2+ and symmetric throughout.     Psych: Normal mood, affect, hygiene and grooming    ASSESSMENT/PLAN:  Annual physical exam - Plan: POCT Urinalysis Dipstick, TSH, Lipid panel, Hepatitis C antibody  Apical variant hypertrophic cardiomyopathy ([place]) - stable per last Echo. BP well controlled  Essential hypertension - controlled - Plan: Lipid panel  Discoid lupus erythematosus - controlled  Rheumatoid arthritis involving right hand, unspecified rheumatoid factor presence (HCC) - stable  Osteopenia of lumbar spine - discussed Ca, Vit D, weight-bearing exercise  Headache associated with orgasm - Plan: MR Brain W Wo Contrast  Palpitations - taught how to check pulse, what to look out for, possible triggers.  f/u with Dr. Debara Raymond if worsening - Plan: TSH  Need for hepatitis C screening test - Plan: Hepatitis C antibody  Right knee pain, unspecified chronicity - OA.  Possible Bakers cyst.  Rec f/u with rheum, may need ortho  Lipid,  TSH, Hep C screen Send to [facility]  Headaches with intercourse/orgasm MRI, consider MRA if normal and ongoing symptoms   Discussed monthly self breast exams and yearly mammograms; at least 30 minutes of aerobic activity at least 5 days/week, weight-bearing exercise at least 2x/wk; proper sunscreen use reviewed; healthy diet, including goals of calcium and vitamin D intake and alcohol recommendations (less than or equal to 1 drink/day) reviewed; regular seatbelt use; changing batteries in smoke detectors. Colonoscopy UTD, due again 2020.  Immunizations--continue yearly flu shots.  Shingrix recommended. Prevnar-13 at age  63. Pap next year.  F/u 1 year, sooner prn.  f/u with Dr. Estanislado Raymond re: knee.  Some concern for loose body with h/o locking, but normal exam.  May need MRI and possible cortisone injection

## 2016-09-29 ENCOUNTER — Ambulatory Visit (INDEPENDENT_AMBULATORY_CARE_PROVIDER_SITE_OTHER): Payer: 59 | Admitting: Family Medicine

## 2016-09-29 ENCOUNTER — Encounter: Payer: Self-pay | Admitting: Family Medicine

## 2016-09-29 VITALS — BP 120/82 | HR 68 | Ht 61.5 in | Wt 155.2 lb

## 2016-09-29 DIAGNOSIS — L930 Discoid lupus erythematosus: Secondary | ICD-10-CM | POA: Diagnosis not present

## 2016-09-29 DIAGNOSIS — M8588 Other specified disorders of bone density and structure, other site: Secondary | ICD-10-CM

## 2016-09-29 DIAGNOSIS — I10 Essential (primary) hypertension: Secondary | ICD-10-CM | POA: Diagnosis not present

## 2016-09-29 DIAGNOSIS — G4482 Headache associated with sexual activity: Secondary | ICD-10-CM

## 2016-09-29 DIAGNOSIS — Z0000 Encounter for general adult medical examination without abnormal findings: Secondary | ICD-10-CM

## 2016-09-29 DIAGNOSIS — Z1159 Encounter for screening for other viral diseases: Secondary | ICD-10-CM | POA: Diagnosis not present

## 2016-09-29 DIAGNOSIS — M069 Rheumatoid arthritis, unspecified: Secondary | ICD-10-CM

## 2016-09-29 DIAGNOSIS — I422 Other hypertrophic cardiomyopathy: Secondary | ICD-10-CM

## 2016-09-29 DIAGNOSIS — M25561 Pain in right knee: Secondary | ICD-10-CM

## 2016-09-29 DIAGNOSIS — R002 Palpitations: Secondary | ICD-10-CM

## 2016-09-29 LAB — POCT URINALYSIS DIPSTICK
Bilirubin, UA: NEGATIVE
Blood, UA: NEGATIVE
Glucose, UA: NEGATIVE
Ketones, UA: NEGATIVE
Leukocytes, UA: NEGATIVE
Nitrite, UA: NEGATIVE
Protein, UA: NEGATIVE
Spec Grav, UA: 1.015 (ref 1.010–1.025)
Urobilinogen, UA: NEGATIVE E.U./dL — AB
pH, UA: 7 (ref 5.0–8.0)

## 2016-09-29 LAB — LIPID PANEL
Cholesterol: 214 mg/dL — ABNORMAL HIGH (ref <200)
HDL: 102 mg/dL (ref >50)
LDL Cholesterol: 98 mg/dL (ref <100)
Total CHOL/HDL Ratio: 2.1 Ratio (ref <5.0)
Triglycerides: 68 mg/dL (ref <150)
VLDL: 14 mg/dL (ref <30)

## 2016-09-29 LAB — TSH: TSH: 1.09 mIU/L

## 2016-09-29 NOTE — Patient Instructions (Addendum)
  HEALTH MAINTENANCE RECOMMENDATIONS:  It is recommended that you get at least 30 minutes of aerobic exercise at least 5 days/week (for weight loss, you may need as much as 60-90 minutes). This can be any activity that gets your heart rate up. This can be divided in 10-15 minute intervals if needed, but try and build up your endurance at least once a week.  Weight bearing exercise is also recommended twice weekly.  Eat a healthy diet with lots of vegetables, fruits and fiber.  "Colorful" foods have a lot of vitamins (ie green vegetables, tomatoes, red peppers, etc).  Limit sweet tea, regular sodas and alcoholic beverages, all of which has a lot of calories and sugar.  Up to 1 alcoholic drink daily may be beneficial for women (unless trying to lose weight, watch sugars).  Drink a lot of water.  Calcium recommendations are 1200-1500 mg daily (1500 mg for postmenopausal women or women without ovaries), and vitamin D 1000 IU daily.  This should be obtained from diet and/or supplements (vitamins), and calcium should not be taken all at once, but in divided doses.  Monthly self breast exams and yearly mammograms for women over the age of 65 is recommended.  Sunscreen of at least SPF 30 should be used on all sun-exposed parts of the skin when outside between the hours of 10 am and 4 pm (not just when at beach or pool, but even with exercise, golf, tennis, and yard work!)  Use a sunscreen that says "broad spectrum" so it covers both UVA and UVB rays, and make sure to reapply every 1-2 hours.  Remember to change the batteries in your smoke detectors when changing your clock times in the spring and fall.  Use your seat belt every time you are in a car, and please drive safely and not be distracted with cell phones and texting while driving.  I recommend getting the new shingles vaccine (Shingrix). You will need to check with your insurance to see if it is covered.  It is a series of 2 injections, spaced 2  months apart. Schedule a nurse visit if interested.  If you get it at a pharmacy, please let us know the date.  When you feel the palpitations, try and feel if the heart rate is very fast, or just some extra or skipped beats.  If fast, try and count your pulse (count for 30 seconds and double it).  If it is under 100, it is fine.  Let me or Dr. Debara Pickett know if you are having this frequently, especially if it lasts more than a minute and if the heart rate is over 120. Pay attention to see if there are any triggers--stay well hydrated and avoid too much caffeine.  We are going to try and evaluate your headaches with MRI--need to get authorization from insurance, etc.  Please follow up with your rheumatologist for your knee pain--if it is affecting your daily activities, and has been for over a month, I wouldn't wait until your next scheduled appointment.  They may be able to give you some relief (if a cortisone shot is appropriate, vs referring for further eval with MRI).

## 2016-09-30 LAB — HEPATITIS C ANTIBODY: HCV Ab: NEGATIVE

## 2016-10-16 ENCOUNTER — Ambulatory Visit
Admission: RE | Admit: 2016-10-16 | Discharge: 2016-10-16 | Disposition: A | Discharge status: Home / Self Care | Payer: 59 | Source: Ambulatory Visit | Attending: Family Medicine | Admitting: Family Medicine

## 2016-10-16 DIAGNOSIS — R51 Headache: Secondary | ICD-10-CM | POA: Diagnosis not present

## 2016-10-16 DIAGNOSIS — G4482 Headache associated with sexual activity: Secondary | ICD-10-CM

## 2016-10-16 MED ORDER — GADOBENATE DIMEGLUMINE 529 MG/ML IV SOLN: 15.0000 mL | Freq: Once | INTRAVENOUS | Status: DC | PRN

## 2016-11-21 DIAGNOSIS — I7300 Raynaud's syndrome without gangrene: Secondary | ICD-10-CM | POA: Insufficient documentation

## 2016-11-21 DIAGNOSIS — Z1231 Encounter for screening mammogram for malignant neoplasm of breast: Secondary | ICD-10-CM | POA: Diagnosis not present

## 2016-11-21 DIAGNOSIS — L568 Other specified acute skin changes due to ultraviolet radiation: Secondary | ICD-10-CM | POA: Insufficient documentation

## 2016-11-21 DIAGNOSIS — Z79899 Other long term (current) drug therapy: Secondary | ICD-10-CM | POA: Insufficient documentation

## 2016-11-21 DIAGNOSIS — M8588 Other specified disorders of bone density and structure, other site: Secondary | ICD-10-CM | POA: Diagnosis not present

## 2016-11-21 LAB — HM DEXA SCAN

## 2016-11-21 LAB — HM MAMMOGRAPHY

## 2016-11-21 NOTE — Progress Notes (Signed)
Office Visit Note  Patient: Alicia Raymond             Date of Birth: [birth year]           MRN: [id number]             PCP: Rita Ohara, MD Referring: Rita Ohara, MD Visit Date: 11/25/2016 Occupation: @GUAROCC @    Subjective:  Knee Pain (Right calf and right knee pain x 06/2016, no injury, difficulty walking and sleeping, no swelling, IBU helps)   History of Present Illness: Alicia Raymond is a 61 y.o. female with history of discoid lupus and osteoarthritis. She states she's been taking Plaquenil on daily basis. She has had no recurrence of her rash. Her Raynauds is not active currently. She states that she's been having pain and discomfort in her right knee since February 2018. She's been having difficulty bending her knee and walking on that knee. She sometimes have pain going down into her calf.  Activities of Daily Living:  Patient reports morning stiffness for 15 minutes.   Patient Reports nocturnal pain.  Difficulty dressing/grooming: Denies Difficulty climbing stairs: Reports Difficulty getting out of chair: Reports Difficulty using hands for taps, buttons, cutlery, and/or writing: Denies   Review of Systems  Constitutional: Negative for fatigue, night sweats, weight gain, weight loss and weakness.  HENT: Negative for mouth sores, trouble swallowing, trouble swallowing, mouth dryness and nose dryness.   Eyes: Negative for pain, redness, visual disturbance and dryness.  Respiratory: Negative for cough, shortness of breath and difficulty breathing.   Cardiovascular: Negative for chest pain, palpitations, hypertension, irregular heartbeat and swelling in legs/feet.  Gastrointestinal: Negative for blood in stool, constipation and diarrhea.  Endocrine: Negative for increased urination.  Genitourinary: Negative for vaginal dryness.  Musculoskeletal: Positive for arthralgias, joint pain and morning stiffness. Negative for joint swelling, myalgias, muscle weakness, muscle  tenderness and myalgias.  Skin: Negative for color change, rash, hair loss, skin tightness, ulcers and sensitivity to sunlight.  Allergic/Immunologic: Negative for susceptible to infections.  Neurological: Negative for dizziness, memory loss and night sweats.  Hematological: Negative for swollen glands.  Psychiatric/Behavioral: Negative for depressed mood and sleep disturbance. The patient is not nervous/anxious.     PMFS History:  Patient Active Problem List   Diagnosis Date Noted  . Photosensitivity 11/21/2016  . Raynaud's disease without gangrene 11/21/2016  . High risk medication use 11/21/2016  . 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 Primary osteoarthritis of both hands 03/25/2016  . Arrhythmia 03/25/2016  . Atypical chest pain 11/21/2013  . Apical variant hypertrophic cardiomyopathy ([place]) 11/09/2012  . HTN (hypertension) 11/09/2012    Past Medical History:  Diagnosis Date  . Apical variant hypertrophic cardiomyopathy (HCC)    Dr. Debara Pickett  . Colon polyp   . Hypertension    Dr. Debara Pickett  . Lupus    DISCOID  . Rheumatoid arthritis(714.0)    Dr. Estanislado Pandy  . Systemic lupus ([place]) 11/30/09   (pt denies systemic)    Family History  Problem Relation Age of Onset  . Lung cancer Father   . Cancer Father   . COPD Brother   . Hyperlipidemia Brother   . Other Sister        brain tumor (benign)  . Hypertension Mother   . Hyperlipidemia Son   . Cancer Sister        stomach cancer  . Cancer Sister 56       metastatic (in abdomen), unknown primary, died 2 mos later  .  Diabetes Neg Hx   . Colon cancer Neg Hx   . Breast cancer Neg Hx    Past Surgical History:  Procedure Laterality Date  . BREAST BIOPSY Right 1988   fibroadenoma (benign)  . CARDIAC CATHETERIZATION  04/12/2010   LAD with 30% narrowing - minimal CAD (Dr. Alma Friendly)  . TRANSTHORACIC ECHOCARDIOGRAM  03/2010   EF=>55%, apical hypertrophy, mod conc LVH; mild MR; trace TR with normal RVSP  . TUBAL LIGATION     Social  History   Social History Narrative   Married.  Lives with her husband.  Oldest daughter lives in [place], son and daughter liver in [place], [place].  5 grandchildren.  (lost 1 daughter at the age of 2.5 yo due to illness)     Objective: Vital Signs: BP 115/71 (BP Location: Right Arm, Patient Position: Sitting, Cuff Size: Normal)   Pulse 61   Ht 5' 2.5" (1.588 m)   Wt 160 lb (72.6 kg)   BMI 28.80 kg/m    Physical Exam  Constitutional: She is oriented to person, place, and time. She appears well-developed and well-nourished.  HENT:  Head: Normocephalic and atraumatic.  Eyes: Conjunctivae and EOM are normal.  Neck: Normal range of motion.  Cardiovascular: Normal rate, regular rhythm, normal heart sounds and intact distal pulses.   Pulmonary/Chest: Effort normal and breath sounds normal.  Abdominal: Soft. Bowel sounds are normal.  Lymphadenopathy:    She has no cervical adenopathy.  Neurological: She is alert and oriented to person, place, and time.  Skin: Skin is warm and dry. Capillary refill takes less than 2 seconds.  Psychiatric: She has a normal mood and affect. Her behavior is normal.  Nursing note and vitals reviewed.    Musculoskeletal Exam: C-spine and thoracic lumbar spine good range of motion. Shoulder joints elbow joints wrist joint MCPs PIPs DIPs with good range of motion. Hip joints ankle joints MTPs PIPs with good range of motion. She is warmth and swelling of her right knee joint. Without any effusion.  CDAI Exam: No CDAI exam completed.    Investigation: Findings:  In November 2015 her labs were all negative which include CBC, comprehensive metabolic panel, sed rate, complements, ANA, ENA, beta 2, anticardiolipin, lupus anticoagulant, UA and vitamin D  CBC Latest Ref Rng & Units 05/30/2016 09/11/2014 03/10/2014  WBC 3.8 - 10.8 K/uL 6.4 5.5 5.0  Hemoglobin 11.7 - 15.5 g/dL 13.4 13.5 13.9  Hematocrit 35.0 - 45.0 % 42.3 41.7 43.2  Platelets 140 - 400 K/uL 242 220 229    CMP Latest Ref Rng & Units 05/30/2016 09/11/2014 03/10/2014  Glucose 65 - 99 mg/dL 95 79 91  BUN 7 - 25 mg/dL 18 18 12   Creatinine 0.50 - 0.99 mg/dL 0.83 0.88 0.76  Sodium 135 - 146 mmol/L 139 138 138  Potassium 3.5 - 5.3 mmol/L 4.7 4.2 4.8  Chloride 98 - 110 mmol/L 105 102 102  CO2 20 - 31 mmol/L 26 29 29   Calcium 8.6 - 10.4 mg/dL 9.5 9.0 9.5  Total Protein 6.1 - 8.1 g/dL 7.2 6.9 6.8  Total Bilirubin 0.2 - 1.2 mg/dL 0.5 0.4 0.7  Alkaline Phos 33 - 130 U/L 42 47 42  AST 10 - 35 U/L 30 26 24   ALT 6 - 29 U/L 32(H) 20 16     Imaging: No results found.  Speciality Comments: No specialty comments available.    Procedures:  Large Joint Inj Date/Time: 11/25/2016 12:16 PM Performed by: Bo Merino Authorized by:  Jared Whorley, [place]   Consent Given by:  Patient Site marked: the procedure site was marked   Timeout: prior to procedure the correct patient, procedure, and site was verified   Indications:  Pain and joint swelling Location:  Knee Site:  R knee Prep: patient was prepped and draped in usual sterile fashion   Needle Size:  27 G Needle Length:  1.5 inches Approach:  Medial Ultrasound Guidance: No   Fluoroscopic Guidance: No   Arthrogram: No   Medications:  1.5 mL lidocaine 1 %; 40 mg triamcinolone acetonide 40 MG/ML Aspiration Attempted: Yes   Aspirate amount (mL):  0 Patient tolerance:  Patient tolerated the procedure well with no immediate complications   Allergies: Septra [bactrim] and Asa [aspirin]   Assessment / Plan:     Visit Diagnoses: Discoid lupus biopsy proven . Patient has had no recurrence of discoid lupus on Plaquenil which she is tolerating well. - Plan: Urinalysis, Routine w reflex microscopic, Sedimentation rate  High risk medication use - Plaquenil 200 mg by mouth daily - Plan: CBC with Differential/Platelet, COMPLETE METABOLIC PANEL WITH GFR  Photosensitivity: Use of sunscreen discussed.  Raynaud's disease without gangrene: Not active  currently  Primary osteoarthritis of both hands: Joint protection and muscle strengthening discussed.  History of hypertension and Arrythmia   Pain and swelling of knee, right . After informed consent was obtained different treatment options were discussed right knee joint was injected with cortisone as described above. 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 I'll obtain following labs today.- Plan: Uric acid, Rheumatoid factor, Cyclic citrul peptide antibody, IgG, Anti-DNA antibody, double-stranded, Angiotensin converting enzyme. I notified patient that if she has persistent pain and discomfort in her knee joint she should notify us and she may need MRI to evaluate this further. I've also given her a handout on knee joint exercises. A prescription for Voltaren gel was also called in which she can apply to her knee joint to relieve some discomfort. Side effects of the Voltaren gel were discussed.  Other fatigue - Plan: VITAMIN D 25 Hydroxy (Vit-D Deficiency, Fractures)    Orders: Orders Placed This Encounter  Procedures  . Large Joint Injection/Arthrocentesis  . CBC with Differential/Platelet  . COMPLETE METABOLIC PANEL WITH GFR  . Urinalysis, Routine w reflex microscopic  . Sedimentation rate  . Uric acid  . Rheumatoid factor  . Cyclic citrul peptide antibody, IgG  . Anti-DNA antibody, double-stranded  . Angiotensin converting enzyme  . VITAMIN D 25 Hydroxy (Vit-D Deficiency, Fractures)   Meds ordered this encounter  Medications  . diclofenac sodium (VOLTAREN) 1 % GEL    Sig: Apply 3 gm to 3 large joints up to 3 times a day.Dispense 3 tubes with 3 refills.    Dispense:  3 Tube    Refill:  1    Face-to-face time spent with patient was 30 minutes. 50% of time was spent in counseling and coordination of care.  Follow-Up Instructions: Return in about 6 months (around 05/28/2017) for DLE,  arthralgia.   Bo Merino, MD  Note - This record has been created using Editor, commissioning.  Chart creation errors have been sought, but may not always  have been located. Such creation errors do not reflect on  the standard of medical care.

## 2016-11-25 ENCOUNTER — Encounter: Payer: Self-pay | Admitting: Rheumatology

## 2016-11-25 ENCOUNTER — Ambulatory Visit (INDEPENDENT_AMBULATORY_CARE_PROVIDER_SITE_OTHER): Payer: 59 | Admitting: Rheumatology

## 2016-11-25 VITALS — BP 115/71 | HR 61 | Ht 62.5 in | Wt 160.0 lb

## 2016-11-25 DIAGNOSIS — R5383 Other fatigue: Secondary | ICD-10-CM

## 2016-11-25 DIAGNOSIS — I7300 Raynaud's syndrome without gangrene: Secondary | ICD-10-CM

## 2016-11-25 DIAGNOSIS — L930 Discoid lupus erythematosus: Secondary | ICD-10-CM | POA: Diagnosis not present

## 2016-11-25 DIAGNOSIS — M19042 Primary osteoarthritis, left hand: Secondary | ICD-10-CM

## 2016-11-25 DIAGNOSIS — L568 Other specified acute skin changes due to ultraviolet radiation: Secondary | ICD-10-CM

## 2016-11-25 DIAGNOSIS — G8929 Other chronic pain: Secondary | ICD-10-CM

## 2016-11-25 DIAGNOSIS — M25561 Pain in right knee: Secondary | ICD-10-CM | POA: Diagnosis not present

## 2016-11-25 DIAGNOSIS — M19041 Primary osteoarthritis, right hand: Secondary | ICD-10-CM

## 2016-11-25 DIAGNOSIS — M25461 Effusion, right knee: Secondary | ICD-10-CM

## 2016-11-25 DIAGNOSIS — Z79899 Other long term (current) drug therapy: Secondary | ICD-10-CM | POA: Diagnosis not present

## 2016-11-25 DIAGNOSIS — Z8679 Personal history of other diseases of the circulatory system: Secondary | ICD-10-CM

## 2016-11-25 LAB — COMPLETE METABOLIC PANEL WITH GFR
ALT: 13 U/L (ref 6–29)
AST: 19 U/L (ref 10–35)
Albumin: 4.1 g/dL (ref 3.6–5.1)
Alkaline Phosphatase: 49 U/L (ref 33–130)
BUN: 14 mg/dL (ref 7–25)
CO2: 26 mmol/L (ref 20–31)
Calcium: 9.8 mg/dL (ref 8.6–10.4)
Chloride: 103 mmol/L (ref 98–110)
Creat: 0.78 mg/dL (ref 0.50–0.99)
GFR, Est African American: >89 mL/min (ref >=60)
GFR, Est Non African American: 81 mL/min (ref >=60)
Glucose, Bld: 87 mg/dL (ref 65–99)
Potassium: 4.7 mmol/L (ref 3.5–5.3)
Sodium: 139 mmol/L (ref 135–146)
Total Bilirubin: 0.7 mg/dL (ref 0.2–1.2)
Total Protein: 7.3 g/dL (ref 6.1–8.1)

## 2016-11-25 LAB — CBC WITH DIFFERENTIAL/PLATELET
Basophils Absolute: 0 cells/uL (ref 0–200)
Basophils Relative: 0 %
Eosinophils Absolute: 204 cells/uL (ref 15–500)
Eosinophils Relative: 4 %
HCT: 43.5 % (ref 35.0–45.0)
Hemoglobin: 14.2 g/dL (ref 11.7–15.5)
Lymphocytes Relative: 44 %
Lymphs Abs: 2244 cells/uL (ref 850–3900)
MCH: 30 pg (ref 27.0–33.0)
MCHC: 32.6 g/dL (ref 32.0–36.0)
MCV: 91.8 fL (ref 80.0–100.0)
MPV: 10.4 fL (ref 7.5–12.5)
Monocytes Absolute: 510 cells/uL (ref 200–950)
Monocytes Relative: 10 %
Neutro Abs: 2142 cells/uL (ref 1500–7800)
Neutrophils Relative %: 42 %
Platelets: 218 10*3/uL (ref 140–400)
RBC: 4.74 MIL/uL (ref 3.80–5.10)
RDW: 13.7 % (ref 11.0–15.0)
WBC: 5.1 10*3/uL (ref 3.8–10.8)

## 2016-11-25 MED ORDER — LIDOCAINE HCL 1 % IJ SOLN
1.5000 mL | INTRAMUSCULAR | Status: AC | PRN
  Administered 2016-11-25: 1.5 mL

## 2016-11-25 MED ORDER — DICLOFENAC SODIUM 1 % TD GEL: TRANSDERMAL | 1 refills | Status: DC

## 2016-11-25 MED ORDER — TRIAMCINOLONE ACETONIDE 40 MG/ML IJ SUSP
40.0000 mg | INTRAMUSCULAR | Status: AC | PRN
  Administered 2016-11-25: 40 mg via INTRA_ARTICULAR

## 2016-11-25 NOTE — Patient Instructions (Addendum)

## 2016-11-26 LAB — URINALYSIS, ROUTINE W REFLEX MICROSCOPIC
Bilirubin Urine: NEGATIVE
Glucose, UA: NEGATIVE
Hgb urine dipstick: NEGATIVE
Ketones, ur: NEGATIVE
Leukocytes, UA: NEGATIVE
Nitrite: NEGATIVE
Protein, ur: NEGATIVE
Specific Gravity, Urine: 1.006 (ref 1.001–1.035)
pH: 6.5 (ref 5.0–8.0)

## 2016-11-26 LAB — URIC ACID: Uric Acid, Serum: 6.8 mg/dL (ref 2.5–7.0)

## 2016-11-26 LAB — SEDIMENTATION RATE: Sed Rate: 4 mm/hr (ref 0–30)

## 2016-11-26 LAB — VITAMIN D 25 HYDROXY (VIT D DEFICIENCY, FRACTURES): Vit D, 25-Hydroxy: 34 ng/mL (ref 30–100)

## 2016-11-28 ENCOUNTER — Ambulatory Visit: Payer: 59 | Admitting: Rheumatology

## 2016-11-28 LAB — RHEUMATOID FACTOR: Rhuematoid fact SerPl-aCnc: <14 IU/mL (ref <14)

## 2016-11-28 LAB — ANGIOTENSIN CONVERTING ENZYME: Angiotensin-Converting Enzyme: 45 U/L (ref 9–67)

## 2016-11-28 LAB — ANTI-DNA ANTIBODY, DOUBLE-STRANDED: ds DNA Ab: 3 IU/mL

## 2016-11-28 LAB — CYCLIC CITRUL PEPTIDE ANTIBODY, IGG: Cyclic Citrullin Peptide Ab: <16 Units

## 2016-11-28 NOTE — Progress Notes (Signed)
Within normal limits

## 2016-11-30 ENCOUNTER — Encounter: Payer: Self-pay | Admitting: *Deleted

## 2016-12-01 ENCOUNTER — Encounter: Payer: Self-pay | Admitting: Family Medicine

## 2016-12-22 ENCOUNTER — Other Ambulatory Visit: Payer: Self-pay | Admitting: Rheumatology

## 2016-12-22 NOTE — Telephone Encounter (Signed)
Last Visit: 11/25/16 Next Visit: 05/30/17 Labs: 11/25/16 WNL PLQ Eye exam: 12/29/15 WNL  Okay to refill per Dr. Estanislado Pandy

## 2017-01-03 DIAGNOSIS — Z79899 Other long term (current) drug therapy: Secondary | ICD-10-CM | POA: Diagnosis not present

## 2017-02-16 DIAGNOSIS — Z23 Encounter for immunization: Secondary | ICD-10-CM | POA: Diagnosis not present

## 2017-03-24 ENCOUNTER — Other Ambulatory Visit: Payer: Self-pay | Admitting: Internal Medicine

## 2017-03-24 ENCOUNTER — Other Ambulatory Visit: Payer: Self-pay | Admitting: Rheumatology

## 2017-03-24 NOTE — Telephone Encounter (Signed)
Last visit: 11/25/2016 Next visit: 05/30/2017 Labs: 11/25/2016 WNL  Eye exam: 01/03/2017  Ok to refill per Dr. Estanislado Pandy.

## 2017-04-17 ENCOUNTER — Encounter: Payer: Self-pay | Admitting: Internal Medicine

## 2017-04-17 ENCOUNTER — Ambulatory Visit (INDEPENDENT_AMBULATORY_CARE_PROVIDER_SITE_OTHER): Payer: 59 | Admitting: Internal Medicine

## 2017-04-17 VITALS — BP 108/74 | HR 79 | Ht 62.0 in | Wt 160.0 lb

## 2017-04-17 DIAGNOSIS — R002 Palpitations: Secondary | ICD-10-CM | POA: Diagnosis not present

## 2017-04-17 DIAGNOSIS — I10 Essential (primary) hypertension: Secondary | ICD-10-CM | POA: Diagnosis not present

## 2017-04-17 DIAGNOSIS — I422 Other hypertrophic cardiomyopathy: Secondary | ICD-10-CM

## 2017-04-17 NOTE — Progress Notes (Signed)
OFFICE NOTE  Chief Complaint:  Heart is "pounding", fatigue  Primary Care Physician: Alicia Ohara, MD  HPI:  Alicia Raymond is a 61 year old female who has a history of apical hypertrophic cardiomyopathy as well as mild hypertension, rheumatoid arthritis and lupus. Overall, those connective tissue disorders have been very well controlled and she has been on losartan, which is hopefully going to be helpful with providing her some prevention from apical hypertrophy. Over the past year, she has stopped exercising, and gained a small amount of weight. She has had a couple of episodes of chest discomfort at night over the past year however they have subsided.  I had the pleasure of seeing Alicia Raymond back in the office today. Overall she's done very well over the past year. She started doing significant exercise almost every morning. Her weight is down now more than 15 pounds to 153. Blood pressure is well controlled at 118/70. She remains on losartan, mostly for apical hypertrophic cardiomyopathy. She's had no chest pain or worsening shortness of breath. She reports her rheumatoid arthritis is better controlled in fact she is on a lower dose of Plaquenil.  01/08/2016  Alicia Raymond returns today for follow-up. She feels that she is doing really well. She lost another 3 pounds and denies any chest pain or worsening shortness of breath. Unfortunately her sister is quite ill in [place] and she is under a lot of stress as she's been traveling and visiting with her. Blood pressure appears excellent today. Her last echocardiogram was in 2011 and has not been reassessed since that time. EKG shows sinus rhythm with LVH and T-wave changes apically and anterolaterally consistent with her prior diagnosis of apical hypertrophic cardiomyopathy. With regards to her RA and lupus those have been well controlled on low-dose plaquenil.  04/17/2017  Alicia Raymond was seen today as an add-on for "heart pounding".  She  reports over the past week she has felt like her heart was racing or pounding.  She says it occasionally is associated with discomfort in the chest and some numbness in her left arm.  Afterward she feels fatigued.  It is not worse with exertion or relieved by rest, and she is continued to be able to do exercises on a treadmill.  She says it comes on randomly and is associated with fatigue.  She says that her husband listened to her heart and noted that it was pounding harder and faster than she remembered.  PMHx:  Past Medical History:  Diagnosis Date  . Apical variant hypertrophic cardiomyopathy (HCC)    Dr. Debara Raymond  . Colon polyp   . Hypertension    Dr. Debara Raymond  . Lupus    DISCOID  . Rheumatoid arthritis(714.0)    Dr. Estanislado Raymond  . Systemic lupus ([place]) 11/30/09   (pt denies systemic)    Past Surgical History:  Procedure Laterality Date  . BREAST BIOPSY Right 1988   fibroadenoma (benign)  . CARDIAC CATHETERIZATION  04/12/2010   LAD with 30% narrowing - minimal CAD (Dr. Alma Raymond)  . TRANSTHORACIC ECHOCARDIOGRAM  03/2010   EF=>55%, apical hypertrophy, mod conc LVH; mild MR; trace TR with normal RVSP  . TUBAL LIGATION      FAMHx:  Family History  Problem Relation Age of Onset  . Lung cancer Father   . Cancer Father   . COPD Brother   . Hyperlipidemia Brother   . Other Sister        brain tumor (benign)  . Hypertension Mother   .  Hyperlipidemia Son   . Cancer Sister        stomach cancer  . Cancer Sister 72       metastatic (in abdomen), unknown primary, died 2 mos later  . Diabetes Neg Hx   . Colon cancer Neg Hx   . Breast cancer Neg Hx     SOCHx:   reports that  has never smoked. she has never used smokeless tobacco. She reports that she drinks alcohol. She reports that she does not use drugs.  ALLERGIES:  Allergies  Allergen Reactions  . Septra [Bactrim] Swelling    Couldn't breathe, lip swelling  . Asa [Aspirin] Rash    Upset stomach    ROS: Pertinent items  noted in HPI and remainder of comprehensive ROS otherwise negative.  HOME MEDS: Current Outpatient Medications  Medication Sig Dispense Refill  . CALCIUM PO Take 500 mg by mouth daily.    . Cholecalciferol (VITAMIN D PO) Take 1,000 Units by mouth daily.     . diclofenac sodium (VOLTAREN) 1 % GEL Apply 3 gm to 3 large joints up to 3 times a day.Dispense 3 tubes with 3 refills. 3 Tube 1  . glucosamine-chondroitin 500-400 MG tablet Take 1 tablet by mouth daily.    . hydroxychloroquine (PLAQUENIL) 200 MG tablet TAKE 1 TABLET BY MOUTH EVERY DAY 90 tablet 0  . losartan (COZAAR) 50 MG tablet Take 1 tablet (50 mg total) by mouth daily. Please schedule appointment for refills 90 tablet 2  . Misc Natural Products (JOINT HEALTH) CAPS Take by mouth 3 (three) times daily.    . Omega-3 Fatty Acids (FISH OIL) 1000 MG CAPS Take 1 capsule by mouth daily.     No current facility-administered medications for this visit.     LABS/IMAGING: No results found for this or any previous visit (from the past 48 hour(s)). No results found.  VITALS: BP 108/74   Pulse 79   Ht 5\' 2"  (1.575 m)   Wt 160 lb (72.6 kg)   BMI 29.26 kg/m   EXAM: General appearance: alert and no distress Neck: no adenopathy, no carotid bruit, no JVD, supple, symmetrical, trachea midline and thyroid not enlarged, symmetric, no tenderness/mass/nodules Lungs: clear to auscultation bilaterally Heart: regular rate and rhythm, S1, S2 normal, no murmur, click, rub or gallop Abdomen: soft, non-tender; bowel sounds normal; no masses,  no organomegaly Extremities: extremities normal, atraumatic, no cyanosis or edema Pulses: 2+ and symmetric Skin: Skin color, texture, turgor normal. No rashes or lesions Neurologic: Grossly normal  EKG: Normal sinus rhythm, LVH with anterolateral T wave inversions-unchanged, personally reviewed  ASSESSMENT: 1. Palpitations 2. Apical variant hypertrophic cardiomyopathy 3. Mild hypertension -  controlled 4. Rheumatoid arthritis-followed by rheumatologist  PLAN: 1.   Ms. Alicia Raymond is describing palpitations and possibly atrial fibrillation.  She has had episodes where her heart is racing and "pounding heart", followed by fatigue.  I like to place a 2-week monitor to see if he can pick up on the cause of her symptoms.  She does not seem to have any exercise-induced chest discomfort although did have some left arm numbness.  She had minimal coronary artery disease by heart catheterization 2011 and has known apical variant hypertrophic cardiomyopathy. Follow-up with me after her monitor.  Pixie Casino, MD, [facility], [place] Director of the Advanced Lipid Disorders &  Cardiovascular Risk Reduction Clinic Attending Cardiologist  Direct Dial: [phone]  Fax: [phone]  Website:  [email or web address]  [email or web address] Noa  C Hilty 04/17/2017, 12:57 PM

## 2017-04-17 NOTE — Patient Instructions (Signed)
Your physician has recommended that you wear an event monitor for 2 weeks placed at [address]. Event monitors are medical devices that record the heart's electrical activity. Doctors most often [place] these monitors to diagnose arrhythmias. Arrhythmias are problems with the speed or rhythm of the heartbeat. The monitor is a small, portable device. You can wear one while you do your normal daily activities. This is usually used to diagnose what is causing palpitations/syncope (passing out).  Your physician recommends that you schedule a follow-up appointment with Dr. Debara Pickett after your monitor in about 4 weeks.

## 2017-04-20 ENCOUNTER — Ambulatory Visit (INDEPENDENT_AMBULATORY_CARE_PROVIDER_SITE_OTHER): Payer: 59

## 2017-04-20 DIAGNOSIS — R002 Palpitations: Secondary | ICD-10-CM

## 2017-04-22 DIAGNOSIS — R002 Palpitations: Secondary | ICD-10-CM | POA: Diagnosis not present

## 2017-05-15 DIAGNOSIS — M329 Systemic lupus erythematosus, unspecified: Secondary | ICD-10-CM | POA: Insufficient documentation

## 2017-05-15 DIAGNOSIS — K635 Polyp of colon: Secondary | ICD-10-CM | POA: Insufficient documentation

## 2017-05-15 DIAGNOSIS — I10 Essential (primary) hypertension: Secondary | ICD-10-CM | POA: Insufficient documentation

## 2017-05-18 NOTE — Progress Notes (Deleted)
Office Visit Note  Patient: Alicia Raymond             Date of Birth: 07/19/1955           MRN: [id number]             PCP: Rita Ohara, MD Referring: Rita Ohara, MD Visit Date: 05/30/2017 Occupation: @GUAROCC @    Subjective:  No chief complaint on file.   History of Present Illness: Alicia Raymond is a 61 y.o. female ***   Activities of Daily Living:  Patient reports morning stiffness for *** {minute/hour:19697}.   Patient {ACTIONS;DENIES/REPORTS:21021675::"Denies"} nocturnal pain.  Difficulty dressing/grooming: {ACTIONS;DENIES/REPORTS:21021675::"Denies"} Difficulty climbing stairs: {ACTIONS;DENIES/REPORTS:21021675::"Denies"} Difficulty getting out of chair: {ACTIONS;DENIES/REPORTS:21021675::"Denies"} Difficulty using hands for taps, buttons, cutlery, and/or writing: {ACTIONS;DENIES/REPORTS:21021675::"Denies"}   No Rheumatology ROS completed.   PMFS History:  Patient Active Problem List   Diagnosis Date Noted  . Lupus   . Hypertension   . Colon polyp   . Heart palpitations 04/17/2017  . Photosensitivity 11/21/2016  . Raynaud's disease without gangrene 11/21/2016  . High risk medication use 11/21/2016  . 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 Primary osteoarthritis of both hands 03/25/2016  . Arrhythmia 03/25/2016  . Atypical chest pain 11/21/2013  . Apical variant hypertrophic cardiomyopathy ([place]) 11/09/2012  . Essential hypertension 11/09/2012  . Systemic lupus ([place]) 11/30/2009    Past Medical History:  Diagnosis Date  . Apical variant hypertrophic cardiomyopathy (HCC)    Dr. Debara Pickett  . Colon polyp   . Hypertension    Dr. Debara Pickett  . Lupus    DISCOID  . Rheumatoid arthritis(714.0)    Dr. Estanislado Pandy  . Systemic lupus ([place]) 11/30/09   (pt denies systemic)    Family History  Problem Relation Age of Onset  . Lung cancer Father   . Cancer Father   . COPD Brother   . Hyperlipidemia Brother   . Other Sister        brain tumor (benign)  . Hypertension Mother     . Hyperlipidemia Son   . Cancer Sister        stomach cancer  . Cancer Sister 51       metastatic (in abdomen), unknown primary, died 2 mos later  . Diabetes Neg Hx   . Colon cancer Neg Hx   . Breast cancer Neg Hx    Past Surgical History:  Procedure Laterality Date  . BREAST BIOPSY Right 1988   fibroadenoma (benign)  . CARDIAC CATHETERIZATION  04/12/2010   LAD with 30% narrowing - minimal CAD (Dr. Alma Friendly)  . TRANSTHORACIC ECHOCARDIOGRAM  03/2010   EF=>55%, apical hypertrophy, mod conc LVH; mild MR; trace TR with normal RVSP  . TUBAL LIGATION     Social History   Social History Narrative   Married.  Lives with her husband.  Oldest daughter lives in [place], son and daughter liver in [facility], [place].  5 grandchildren.  (lost 1 daughter at the age of 2.5 yo due to illness)     Objective: Vital Signs: There were no vitals taken for this visit.   Physical Exam   Musculoskeletal Exam: ***  CDAI Exam: No CDAI exam completed.    Investigation: No additional findings.PLQ eye exam: 01/03/2017 CBC Latest Ref Rng & Units 11/25/2016 05/30/2016 09/11/2014  WBC 3.8 - 10.8 K/uL 5.1 6.4 5.5  Hemoglobin 11.7 - 15.5 g/dL 14.2 13.4 13.5  Hematocrit 35.0 - 45.0 % 43.5 42.3 41.7  Platelets 140 - 400 K/uL 218 242 220  CMP Latest Ref Rng & Units 11/25/2016 05/30/2016 09/11/2014  Glucose 65 - 99 mg/dL 87 95 79  BUN 7 - 25 mg/dL 14 18 18   Creatinine 0.50 - 0.99 mg/dL 0.78 0.83 0.88  Sodium 135 - 146 mmol/L 139 139 138  Potassium 3.5 - 5.3 mmol/L 4.7 4.7 4.2  Chloride 98 - 110 mmol/L 103 105 102  CO2 20 - 31 mmol/L 26 26 29   Calcium 8.6 - 10.4 mg/dL 9.8 9.5 9.0  Total Protein 6.1 - 8.1 g/dL 7.3 7.2 6.9  Total Bilirubin 0.2 - 1.2 mg/dL 0.7 0.5 0.4  Alkaline Phos 33 - 130 U/L 49 42 47  AST 10 - 35 U/L 19 30 26   ALT 6 - 29 U/L 13 32(H) 20    Imaging: No results found.  Speciality Comments: No specialty comments available.    Procedures:  No procedures performed Allergies: Septra  [bactrim] and Asa [aspirin]   Assessment / Plan:     Visit Diagnoses: No diagnosis found.    Orders: No orders of the defined types were placed in this encounter.  No orders of the defined types were placed in this encounter.   Face-to-face time spent with patient was *** minutes. 50% of time was spent in counseling and coordination of care.  Follow-Up Instructions: No Follow-up on file.   Earnestine Mealing, CMA  Note - This record has been created using Editor, commissioning.  Chart creation errors have been sought, but may not always  have been located. Such creation errors do not reflect on  the standard of medical care.

## 2017-05-22 ENCOUNTER — Encounter: Payer: Self-pay | Admitting: Internal Medicine

## 2017-05-22 ENCOUNTER — Ambulatory Visit (INDEPENDENT_AMBULATORY_CARE_PROVIDER_SITE_OTHER): Payer: 59 | Admitting: Internal Medicine

## 2017-05-22 VITALS — BP 114/75 | HR 72 | Ht 62.0 in | Wt 161.6 lb

## 2017-05-22 DIAGNOSIS — I10 Essential (primary) hypertension: Secondary | ICD-10-CM

## 2017-05-22 DIAGNOSIS — I422 Other hypertrophic cardiomyopathy: Secondary | ICD-10-CM | POA: Diagnosis not present

## 2017-05-22 DIAGNOSIS — R002 Palpitations: Secondary | ICD-10-CM | POA: Diagnosis not present

## 2017-05-22 NOTE — Progress Notes (Signed)
OFFICE NOTE  Chief Complaint:  Monitor results  Primary Care Physician: Rita Ohara, MD  HPI:  Alicia Raymond is a 61 year old female who has a history of apical hypertrophic cardiomyopathy as well as mild hypertension, rheumatoid arthritis and lupus. Overall, those connective tissue disorders have been very well controlled and she has been on losartan, which is hopefully going to be helpful with providing her some prevention from apical hypertrophy. Over the past year, she has stopped exercising, and gained a small amount of weight. She has had a couple of episodes of chest discomfort at night over the past year however they have subsided.  I had the pleasure of seeing [name] back in the office today. Overall she's done very well over the past year. She started doing significant exercise almost every morning. Her weight is down now more than 15 pounds to 153. Blood pressure is well controlled at 118/70. She remains on losartan, mostly for apical hypertrophic cardiomyopathy. She's had no chest pain or worsening shortness of breath. She reports her rheumatoid arthritis is better controlled in fact she is on a lower dose of Plaquenil.  01/08/2016  Alicia Raymond returns today for follow-up. She feels that she is doing really well. She lost another 3 pounds and denies any chest pain or worsening shortness of breath. Unfortunately her sister is quite ill in [place] and she is under a lot of stress as she's been traveling and visiting with her. Blood pressure appears excellent today. Her last echocardiogram was in 2011 and has not been reassessed since that time. EKG shows sinus rhythm with LVH and T-wave changes apically and anterolaterally consistent with her prior diagnosis of apical hypertrophic cardiomyopathy. With regards to her RA and lupus those have been well controlled on low-dose plaquenil.  04/17/2017  Alicia Raymond was seen today as an add-on for "heart pounding".  She reports over  the past week she has felt like her heart was racing or pounding.  She says it occasionally is associated with discomfort in the chest and some numbness in her left arm.  Afterward she feels fatigued.  It is not worse with exertion or relieved by rest, and she is continued to be able to do exercises on a treadmill.  She says it comes on randomly and is associated with fatigue.  She says that her husband listened to her heart and noted that it was pounding harder and faster than she remembered.  05/22/2017  Alicia Raymond returns today for follow-up of her monitor results.  This did not show any extrasystoles or arrhythmias that would be associated with her symptoms of palpitations.  She says they seem to be worse when having disagreements with her husband and I suspect they are related to stress.  She has had a little physical responsibilities over the past week and has felt much better.  PMHx:  Past Medical History:  Diagnosis Date  . Apical variant hypertrophic cardiomyopathy (HCC)    Dr. Debara Pickett  . Colon polyp   . Hypertension    Dr. Debara Pickett  . Lupus    DISCOID  . Rheumatoid arthritis(714.0)    Dr. Estanislado Pandy  . Systemic lupus ([place]) 11/30/09   (pt denies systemic)    Past Surgical History:  Procedure Laterality Date  . BREAST BIOPSY Right 1988   fibroadenoma (benign)  . CARDIAC CATHETERIZATION  04/12/2010   LAD with 30% narrowing - minimal CAD (Dr. Alma Friendly)  . TRANSTHORACIC ECHOCARDIOGRAM  03/2010   EF=>55%, apical hypertrophy, mod conc LVH;  mild MR; trace TR with normal RVSP  . TUBAL LIGATION      FAMHx:  Family History  Problem Relation Age of Onset  . Lung cancer Father   . Cancer Father   . COPD Brother   . Hyperlipidemia Brother   . Other Sister        brain tumor (benign)  . Hypertension Mother   . Hyperlipidemia Son   . Cancer Sister        stomach cancer  . Cancer Sister 32       metastatic (in abdomen), unknown primary, died 2 mos later  . Diabetes Neg Hx   . Colon  cancer Neg Hx   . Breast cancer Neg Hx     SOCHx:   reports that  has never smoked. she has never used smokeless tobacco. She reports that she drinks alcohol. She reports that she does not use drugs.  ALLERGIES:  Allergies  Allergen Reactions  . Septra [Bactrim] Swelling    Couldn't breathe, lip swelling  . Asa [Aspirin] Rash    Upset stomach    ROS: Pertinent items noted in HPI and remainder of comprehensive ROS otherwise negative.  HOME MEDS: Current Outpatient Medications  Medication Sig Dispense Refill  . CALCIUM PO Take 500 mg by mouth daily.    . Cholecalciferol (VITAMIN D PO) Take 1,000 Units by mouth daily.     Marland Kitchen glucosamine-chondroitin 500-400 MG tablet Take 1 tablet by mouth daily.    . hydroxychloroquine (PLAQUENIL) 200 MG tablet TAKE 1 TABLET BY MOUTH EVERY DAY 90 tablet 0  . losartan (COZAAR) 50 MG tablet Take 1 tablet (50 mg total) by mouth daily. Please schedule appointment for refills 90 tablet 2  . Misc Natural Products (JOINT HEALTH) CAPS Take by mouth 3 (three) times daily.    . Omega-3 Fatty Acids (FISH OIL) 1000 MG CAPS Take 1 capsule by mouth daily.     No current facility-administered medications for this visit.     LABS/IMAGING: No results found for this or any previous visit (from the past 48 hour(s)). No results found.  VITALS: BP 114/75   Pulse 72   Ht 5\' 2"  (1.157 m)   Wt 161 lb 9.6 oz (73.3 kg)   SpO2 97%   BMI 29.56 kg/m   EXAM: Deferred  EKG: Deferred  ASSESSMENT: 1. Palpitations 2. Apical variant hypertrophic cardiomyopathy 3. Mild hypertension - controlled 4. Rheumatoid arthritis-followed by rheumatologist  PLAN: 1.   I could not find a cause of her palpitations, but I suspect it may be related to stress.  We discussed ways to mitigate her stress including mindfulness and biofeedback.  There is a lot of this information available on the Internet and I directed her there.  Follow-up with me annually or sooner as  necessary.  Pixie Casino, MD, [facility], [place] Director of the Advanced Lipid Disorders &  Cardiovascular Risk Reduction Clinic Attending Cardiologist  Direct Dial: [phone]  Fax: [phone]  Website:  [email or web address] 05/22/2017, 4:57 PM

## 2017-05-22 NOTE — Patient Instructions (Signed)
Medication Instructions: Your physician recommends that you continue on your current medications as directed. Please refer to the Current Medication list given to you today.  If you need a refill on your cardiac medications before your next appointment, please call your pharmacy.    Follow-Up: Your physician wants you to follow-up in: 12 months with Dr. Debara Pickett. You will receive a reminder letter in the mail two months in advance. If you don't receive a letter, please call our office at [phone] to schedule this follow-up appointment.   Thank you for choosing Heartcare at [facility]!!

## 2017-05-30 ENCOUNTER — Ambulatory Visit: Payer: 59 | Admitting: Rheumatology

## 2017-06-23 ENCOUNTER — Other Ambulatory Visit: Payer: Self-pay | Admitting: Rheumatology

## 2017-06-23 DIAGNOSIS — Z23 Encounter for immunization: Secondary | ICD-10-CM | POA: Diagnosis not present

## 2017-06-26 NOTE — Telephone Encounter (Signed)
Last visit: 05/30/2017 Next visit: 07/17/2017 Labs: 11/25/2016 WNL Eye exam: 01/03/2017   Patient is aware she is due for labs. Patient refuses to come for labs until her appointment on 07/17/2017.   Okay to refill per Lovena Le.

## 2017-07-03 NOTE — Progress Notes (Deleted)
Office Visit Note  Patient: Alicia Raymond             Date of Birth: 05/07/1956           MRN: [id number]             PCP: Rita Ohara, MD Referring: Rita Ohara, MD Visit Date: 07/17/2017 Occupation: @GUAROCC @    Subjective:  No chief complaint on file.   History of Present Illness: Alicia Raymond is a 62 y.o. female ***   Activities of Daily Living:  Patient reports morning stiffness for *** {minute/hour:19697}.   Patient {ACTIONS;DENIES/REPORTS:21021675::"Denies"} nocturnal pain.  Difficulty dressing/grooming: {ACTIONS;DENIES/REPORTS:21021675::"Denies"} Difficulty climbing stairs: {ACTIONS;DENIES/REPORTS:21021675::"Denies"} Difficulty getting out of chair: {ACTIONS;DENIES/REPORTS:21021675::"Denies"} Difficulty using hands for taps, buttons, cutlery, and/or writing: {ACTIONS;DENIES/REPORTS:21021675::"Denies"}   No Rheumatology ROS completed.   PMFS History:  Patient Active Problem List   Diagnosis Date Noted  . Lupus   . Hypertension   . Colon polyp   . Heart palpitations 04/17/2017  . Photosensitivity 11/21/2016  . Raynaud's disease without gangrene 11/21/2016  . High risk medication use 11/21/2016  . 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 Primary osteoarthritis of both hands 03/25/2016  . Arrhythmia 03/25/2016  . Atypical chest pain 11/21/2013  . Apical variant hypertrophic cardiomyopathy ([place]) 11/09/2012  . Essential hypertension 11/09/2012  . Systemic lupus ([place]) 11/30/2009    Past Medical History:  Diagnosis Date  . Apical variant hypertrophic cardiomyopathy (HCC)    Dr. Debara Pickett  . Colon polyp   . Hypertension    Dr. Debara Pickett  . Lupus    DISCOID  . Rheumatoid arthritis(714.0)    Dr. Estanislado Pandy  . Systemic lupus ([place]) 11/30/09   (pt denies systemic)    Family History  Problem Relation Age of Onset  . Lung cancer Father   . Cancer Father   . COPD Brother   . Hyperlipidemia Brother   . Other Sister        brain tumor (benign)  . Hypertension Mother     . Hyperlipidemia Son   . Cancer Sister        stomach cancer  . Cancer Sister 54       metastatic (in abdomen), unknown primary, died 2 mos later  . Diabetes Neg Hx   . Colon cancer Neg Hx   . Breast cancer Neg Hx    Past Surgical History:  Procedure Laterality Date  . BREAST BIOPSY Right 1988   fibroadenoma (benign)  . CARDIAC CATHETERIZATION  04/12/2010   LAD with 30% narrowing - minimal CAD (Dr. Alma Friendly)  . TRANSTHORACIC ECHOCARDIOGRAM  03/2010   EF=>55%, apical hypertrophy, mod conc LVH; mild MR; trace TR with normal RVSP  . TUBAL LIGATION     Social History   Social History Narrative   Married.  Lives with her husband.  Oldest daughter lives in [place], son and daughter liver in [facility], [place].  5 grandchildren.  (lost 1 daughter at the age of 2.5 yo due to illness)     Objective: Vital Signs: There were no vitals taken for this visit.   Physical Exam   Musculoskeletal Exam: ***  CDAI Exam: No CDAI exam completed.    Investigation: No additional findings.PLQ eye exam: 01/03/2017 CBC Latest Ref Rng & Units 11/25/2016 05/30/2016 09/11/2014  WBC 3.8 - 10.8 K/uL 5.1 6.4 5.5  Hemoglobin 11.7 - 15.5 g/dL 14.2 13.4 13.5  Hematocrit 35.0 - 45.0 % 43.5 42.3 41.7  Platelets 140 - 400 K/uL 218 242 220  CMP Latest Ref Rng & Units 11/25/2016 05/30/2016 09/11/2014  Glucose 65 - 99 mg/dL 87 95 79  BUN 7 - 25 mg/dL 14 18 18   Creatinine 0.50 - 0.99 mg/dL 0.78 0.83 0.88  Sodium 135 - 146 mmol/L 139 139 138  Potassium 3.5 - 5.3 mmol/L 4.7 4.7 4.2  Chloride 98 - 110 mmol/L 103 105 102  CO2 20 - 31 mmol/L 26 26 29   Calcium 8.6 - 10.4 mg/dL 9.8 9.5 9.0  Total Protein 6.1 - 8.1 g/dL 7.3 7.2 6.9  Total Bilirubin 0.2 - 1.2 mg/dL 0.7 0.5 0.4  Alkaline Phos 33 - 130 U/L 49 42 47  AST 10 - 35 U/L 19 30 26   ALT 6 - 29 U/L 13 32(H) 20    Imaging: No results found.  Speciality Comments: No specialty comments available.    Procedures:  No procedures performed Allergies: Septra  [bactrim] and Asa [aspirin]   Assessment / Plan:     Visit Diagnoses: No diagnosis found.    Orders: No orders of the defined types were placed in this encounter.  No orders of the defined types were placed in this encounter.   Face-to-face time spent with patient was *** minutes. 50% of time was spent in counseling and coordination of care.  Follow-Up Instructions: No Follow-up on file.   Earnestine Mealing, CMA  Note - This record has been created using Editor, commissioning.  Chart creation errors have been sought, but may not always  have been located. Such creation errors do not reflect on  the standard of medical care.

## 2017-07-07 NOTE — Progress Notes (Signed)
Office Visit Note  Patient: Alicia Raymond             Date of Birth: Sep 26, 1955           MRN: [id number]             PCP: Rita Ohara, MD Referring: Rita Ohara, MD Visit Date: 07/19/2017 Occupation: @GUAROCC @    Subjective:     History of Present Illness: Alicia Raymond is a 62 y.o. female with history of discoid lupus and osteoarthritis.  She states she has been having some discomfort in her right knee and right ankle joint off and on.  She does have some swelling in her knee joint.  She denies any recent rash.  She continues to have raynaud's symptoms.   Activities of Daily Living:  Patient reports morning stiffness for 0 minutes.   Patient Reports nocturnal pain.  Difficulty dressing/grooming: Denies Difficulty climbing stairs: Reports Difficulty getting out of chair: Reports Difficulty using hands for taps, buttons, cutlery, and/or writing: Reports   Review of Systems  Constitutional: Positive for fatigue. Negative for weakness.  HENT: Positive for mouth sores and mouth dryness. Negative for trouble swallowing, trouble swallowing and nose dryness.   Eyes: Positive for dryness. Negative for pain, redness and visual disturbance.  Respiratory: Negative for cough, hemoptysis, shortness of breath and difficulty breathing.   Cardiovascular: Negative for chest pain, palpitations, hypertension, irregular heartbeat and swelling in legs/feet.  Gastrointestinal: Negative for blood in stool, constipation and diarrhea.  Endocrine: Negative for increased urination.  Genitourinary: Negative for painful urination.  Musculoskeletal: Positive for myalgias, muscle tenderness and myalgias. Negative for arthralgias, joint pain, joint swelling, muscle weakness and morning stiffness.  Skin: Positive for color change and hair loss. Negative for pallor, rash, nodules/bumps, redness, skin tightness, ulcers and sensitivity to sunlight.  Allergic/Immunologic: Negative for susceptible to  infections.  Neurological: Negative for dizziness, light-headedness, numbness and headaches.  Hematological: Negative for swollen glands.  Psychiatric/Behavioral: Positive for depressed mood and sleep disturbance. The patient is nervous/anxious.     PMFS History:  Patient Active Problem List   Diagnosis Date Noted  . Lupus   . Hypertension   . Colon polyp   . Heart palpitations 04/17/2017  . Photosensitivity 11/21/2016  . Raynaud's disease without gangrene 11/21/2016  . High risk medication use 11/21/2016  . 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 Primary osteoarthritis of both hands 03/25/2016  . Arrhythmia 03/25/2016  . Atypical chest pain 11/21/2013  . 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 Essential hypertension 11/09/2012  . Systemic lupus ([place]) 11/30/2009    Past Medical History:  Diagnosis Date  . Apical variant hypertrophic cardiomyopathy (HCC)    Dr. Debara Pickett  . Colon polyp   . Hypertension    Dr. Debara Pickett  . Lupus    DISCOID  . Rheumatoid arthritis(714.0)    Dr. Estanislado Pandy  . Systemic lupus ([place]) 11/30/09   (pt denies systemic)    Family History  Problem Relation Age of Onset  . Lung cancer Father   . Cancer Father   . COPD Brother   . Hyperlipidemia Brother   . Other Sister        brain tumor (benign)  . Hypertension Mother   . Hyperlipidemia Son   . Cancer Sister        stomach cancer  . Cancer Sister 67       metastatic (in abdomen), unknown primary, died 2 mos later  . Diabetes Neg Hx   .  Colon cancer Neg Hx   . Breast cancer Neg Hx    Past Surgical History:  Procedure Laterality Date  . BREAST BIOPSY Right 1988   fibroadenoma (benign)  . CARDIAC CATHETERIZATION  04/12/2010   LAD with 30% narrowing - minimal CAD (Dr. Alma Friendly)  . TRANSTHORACIC ECHOCARDIOGRAM  03/2010   EF=>55%, apical hypertrophy, mod conc LVH; mild MR; trace TR with normal RVSP  . TUBAL LIGATION     Social History   Social History Narrative   Married.  Lives  with her husband.  Oldest daughter lives in [place], son and daughter liver in [place], [place].  5 grandchildren.  (lost 1 daughter at the age of 2.5 yo due to illness)     Objective: Vital Signs: BP 121/81 (BP Location: Left Arm, Patient Position: Sitting, Cuff Size: Normal)   Pulse 69   Resp 17   Ht 5\' 2"  (1.575 m)   Wt 166 lb (75.3 kg)   BMI 30.36 kg/m    Physical Exam  Constitutional: She is oriented to person, place, and time. She appears well-developed and well-nourished.  HENT:  Head: Normocephalic and atraumatic.  Eyes: Conjunctivae and EOM are normal.  Neck: Normal range of motion.  Cardiovascular: Normal rate, regular rhythm, normal heart sounds and intact distal pulses.  Pulmonary/Chest: Effort normal and breath sounds normal.  Abdominal: Soft. Bowel sounds are normal.  Lymphadenopathy:    She has no cervical adenopathy.  Neurological: She is alert and oriented to person, place, and time.  Skin: Skin is warm and dry. Capillary refill takes less than 2 seconds.  Psychiatric: She has a normal mood and affect. Her behavior is normal.  Nursing note and vitals reviewed.    Musculoskeletal Exam: C-spine thoracic lumbar spine good range of motion.  Shoulder joints elbow joints wrist joint MCPs PIPs DIPs with good range of motion.  Hip joints knee joints ankles MTPs PIPs DIPs with good range of motion.  No synovitis was noted.  CDAI Exam: No CDAI exam completed.    Investigation: No additional findings.PLQ eye exam: 01/03/2017 CBC Latest Ref Rng & Units 11/25/2016 05/30/2016 09/11/2014  WBC 3.8 - 10.8 K/uL 5.1 6.4 5.5  Hemoglobin 11.7 - 15.5 g/dL 14.2 13.4 13.5  Hematocrit 35.0 - 45.0 % 43.5 42.3 41.7  Platelets 140 - 400 K/uL 218 242 220   CMP Latest Ref Rng & Units 11/25/2016 05/30/2016 09/11/2014  Glucose 65 - 99 mg/dL 87 95 79  BUN 7 - 25 mg/dL 14 18 18   Creatinine 0.50 - 0.99 mg/dL 0.78 0.83 0.88  Sodium 135 - 146 mmol/L 139 139 138  Potassium 3.5 - 5.3 mmol/L 4.7 4.7 4.2    Chloride 98 - 110 mmol/L 103 105 102  CO2 20 - 31 mmol/L 26 26 29   Calcium 8.6 - 10.4 mg/dL 9.8 9.5 9.0  Total Protein 6.1 - 8.1 g/dL 7.3 7.2 6.9  Total Bilirubin 0.2 - 1.2 mg/dL 0.7 0.5 0.4  Alkaline Phos 33 - 130 U/L 49 42 47  AST 10 - 35 U/L 19 30 26   ALT 6 - 29 U/L 13 32(H) 20    Imaging: No results found.  Speciality Comments: No specialty comments available.    Procedures:  No procedures performed Allergies: Septra [bactrim] and Asa [aspirin]   Assessment / Plan:     Visit Diagnoses: Discoid lupus - biopsy proven.  She has been in remission without any rash.  She has been taking Plaquenil 1 a day which has been  working quite well for her.  High risk medication use - Plaquenil 200 mg by mouth dailyeye exam: 01/03/2017 - Plan: CBC with Differential/Platelet, COMPLETE METABOLIC PANEL WITH GFR  Raynaud's disease without gangrene: she is currently not symptomatic.  Right lower extremity pain: Patient reports that she has muscle spasms.  Massage and stretching exercises were demonstrated and discussed today.  Photosensitivity: She uses sunscreen which is helpful.  Primary osteoarthritis of both hands when she is doing better with natural anti-inflammatories.  History of hypertension - and Arrythmia   Other fatigue doing better   Orders: Orders Placed This Encounter  Procedures  . CBC with Differential/Platelet  . COMPLETE METABOLIC PANEL WITH GFR   No orders of the defined types were placed in this encounter.   Face-to-face time spent with patient was 30 minutes.  Greater than 50% of time was spent in counseling and coordination of care.  Follow-Up Instructions: Return in about 6 months (around 01/16/2018) for DLE,OA.   Bo Merino, MD  Note - This record has been created using Editor, commissioning.  Chart creation errors have been sought, but may not always  have been located. Such creation errors do not reflect on  the standard of medical care.

## 2017-07-17 ENCOUNTER — Ambulatory Visit: Payer: 59 | Admitting: Rheumatology

## 2017-07-19 ENCOUNTER — Ambulatory Visit (INDEPENDENT_AMBULATORY_CARE_PROVIDER_SITE_OTHER): Payer: 59 | Admitting: Rheumatology

## 2017-07-19 ENCOUNTER — Encounter: Payer: Self-pay | Admitting: Rheumatology

## 2017-07-19 VITALS — BP 121/81 | HR 69 | Resp 17 | Ht 62.0 in | Wt 166.0 lb

## 2017-07-19 DIAGNOSIS — L568 Other specified acute skin changes due to ultraviolet radiation: Secondary | ICD-10-CM

## 2017-07-19 DIAGNOSIS — Z79899 Other long term (current) drug therapy: Secondary | ICD-10-CM | POA: Diagnosis not present

## 2017-07-19 DIAGNOSIS — M79604 Pain in right leg: Secondary | ICD-10-CM

## 2017-07-19 DIAGNOSIS — Z8679 Personal history of other diseases of the circulatory system: Secondary | ICD-10-CM

## 2017-07-19 DIAGNOSIS — M19042 Primary osteoarthritis, left hand: Secondary | ICD-10-CM

## 2017-07-19 DIAGNOSIS — I7300 Raynaud's syndrome without gangrene: Secondary | ICD-10-CM

## 2017-07-19 DIAGNOSIS — R5383 Other fatigue: Secondary | ICD-10-CM

## 2017-07-19 DIAGNOSIS — M19041 Primary osteoarthritis, right hand: Secondary | ICD-10-CM

## 2017-07-19 DIAGNOSIS — L930 Discoid lupus erythematosus: Secondary | ICD-10-CM | POA: Diagnosis not present

## 2017-07-20 LAB — CBC WITH DIFFERENTIAL/PLATELET
Basophils Absolute: 37 cells/uL (ref 0–200)
Basophils Relative: 0.7 %
Eosinophils Absolute: 286 cells/uL (ref 15–500)
Eosinophils Relative: 5.4 %
HCT: 44.1 % (ref 35.0–45.0)
Hemoglobin: 14.1 g/dL (ref 11.7–15.5)
Lymphs Abs: 2141 cells/uL (ref 850–3900)
MCH: 28.1 pg (ref 27.0–33.0)
MCHC: 32 g/dL (ref 32.0–36.0)
MCV: 88 fL (ref 80.0–100.0)
MPV: 11 fL (ref 7.5–12.5)
Monocytes Relative: 10.5 %
Neutro Abs: 2279 cells/uL (ref 1500–7800)
Neutrophils Relative %: 43 %
Platelets: 246 10*3/uL (ref 140–400)
RBC: 5.01 10*6/uL (ref 3.80–5.10)
RDW: 13.4 % (ref 11.0–15.0)
Total Lymphocyte: 40.4 %
WBC mixed population: 557 cells/uL (ref 200–950)
WBC: 5.3 10*3/uL (ref 3.8–10.8)

## 2017-07-20 LAB — COMPLETE METABOLIC PANEL WITH GFR
AG Ratio: 1.2 (calc) (ref 1.0–2.5)
ALT: 17 U/L (ref 6–29)
AST: 23 U/L (ref 10–35)
Albumin: 4.2 g/dL (ref 3.6–5.1)
Alkaline phosphatase (APISO): 49 U/L (ref 33–130)
BUN: 16 mg/dL (ref 7–25)
CO2: 29 mmol/L (ref 20–32)
Calcium: 9.9 mg/dL (ref 8.6–10.4)
Chloride: 104 mmol/L (ref 98–110)
Creat: 0.83 mg/dL (ref 0.50–0.99)
GFR, Est African American: 87 mL/min/{1.73_m2} (ref >=60)
GFR, Est Non African American: 75 mL/min/{1.73_m2} (ref >=60)
Globulin: 3.4 g/dL (calc) (ref 1.9–3.7)
Glucose, Bld: 96 mg/dL (ref 65–99)
Potassium: 5.4 mmol/L — ABNORMAL HIGH (ref 3.5–5.3)
Sodium: 140 mmol/L (ref 135–146)
Total Bilirubin: 0.5 mg/dL (ref 0.2–1.2)
Total Protein: 7.6 g/dL (ref 6.1–8.1)

## 2017-10-07 ENCOUNTER — Encounter: Payer: Self-pay | Admitting: Family Medicine

## 2017-10-07 NOTE — Progress Notes (Signed)
Chief Complaint  Patient presents with  . Annual Exam    fasting annual exam with pap. Just had recent eye exam with Dr.Oman. No concerns today.     Alicia Raymond is a 62 y.o. female who presents for a complete physical.  She has no concerns. She reports being a little tired only because she hasn't had her coffee yet (fasting).    Last year patient was reporting headaches with orgasm, consistent with orgasm x 1-2 yrs; no other headaches.  MRI was performed, and showed changes consistent with probable chronic microvascular ischemic change. No acute intracranial findings. No abnormal postcontrast enhancement. No longer having these headaches, just occasional "stress" headache posteriorly.  She is under the care of Dr. Debara Pickett for apical variant hypertrophic  cardiomyopathy, mild hypertension. She last saw him in November/December when she was complaining of palpitations. Monitor did not show cause, he felt stress contributed. Palpitations have resolved. She continues on on losartan to help prevent any worsening of cardiomyopathy. BP has been well controlled.  Denies any chest pain, shortness of breath, palpitations, or side effects to medications. Denies lightheadedness,headaches, dizziness, syncope, edema.   CBC and c-met done in February, K+ borderline at 5.4 (had been normal on labs through rheum 11/2016).     Chemistry      Component Value Date/Time   NA 140 07/19/2017 1616   K 5.4 (H) 07/19/2017 1616   CL 104 07/19/2017 1616   CO2 29 07/19/2017 1616   BUN 16 07/19/2017 1616   CREATININE 0.83 07/19/2017 1616      Component Value Date/Time   CALCIUM 9.9 07/19/2017 1616   ALKPHOS 49 11/25/2016 1233   AST 23 07/19/2017 1616   ALT 17 07/19/2017 1616   BILITOT 0.5 07/19/2017 1616     Lab Results  Component Value Date   WBC 5.3 07/19/2017   HGB 14.1 07/19/2017   HCT 44.1 07/19/2017   MCV 88.0 07/19/2017   PLT 246 07/19/2017    Arthritis and discoid lupus: She sees Dr.  Estanislado Pandy, whose notes actually report OA (NOT RA) and discoid lupus, which is why she is on Plaquenil.  She has pain in hands/fingers.  Her right knee has been bothering her, has had cortisone injections from Dr. Estanislado Pandy, but not recently.  It is limiting her activities.    Admits she has been taking Plaquenil every other day over the last week, "just to see", doesn't really like taking medications.  Hasn't had any recurrent rash.  No side effects to medications.   She last saw her in February.  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 Denies any rashes.    Immunization History  Administered Date(s) Administered  . Influenza Split 04/03/2012, 02/08/2013, 02/12/2014  . Influenza-Unspecified 02/13/2015, 02/18/2016, 02/16/2017  . Pneumococcal Polysaccharide-23 02/08/2013  . Tdap 02/12/2014  . Zoster 08/26/2014  . Zoster Recombinat (Shingrix) 02/16/2017, 06/23/2017   Last Pap smear: 02/2014, normal, no high risk HPV Last mammogram: 11/2016 Last colonoscopy: 04/2014--tubular adenoma. Repeat 5 years Last DEXA: 11/2016: T-1.7 spine (prev 09/2014 T-1.6 spine, 12/2012 T-1.3) Dentist: twice yearly Ophtho: yearly ([facility]) Exercise: Hadn't done much exercise in the last year due to knee pain; restarted exercise in the last 2 months. Walks with 3# weights on her treadmill.  Does 30 minutes on treadmill, followed by 30 minutes of exercise bike, 5-7 days/week. Vitamin D-OH was 34 in 11/2016 Lipids: Lab Results  Component Value Date  CHOL 214 (H) 09/29/2016   HDL 102 09/29/2016   LDLCALC 98 09/29/2016   TRIG 68 09/29/2016   CHOLHDL 2.1 09/29/2016   Thyroid screen: Lab Results  Component Value Date   TSH 1.09 09/29/2016   Past Medical History:  Diagnosis Date  . Apical variant hypertrophic cardiomyopathy (HCC)    Dr. Debara Pickett  . Colon polyp   . Hypertension    Dr. Debara Pickett  . Lupus ([place])    DISCOID  . Rheumatoid  arthritis(714.0)    Dr. Estanislado Pandy  . Systemic lupus ([place]) 11/30/09   (pt denies systemic)    Past Surgical History:  Procedure Laterality Date  . BREAST BIOPSY Right 1988   fibroadenoma (benign)  . CARDIAC CATHETERIZATION  04/12/2010   LAD with 30% narrowing - minimal CAD (Dr. Alma Friendly)  . TRANSTHORACIC ECHOCARDIOGRAM  03/2010   EF=>55%, apical hypertrophy, mod conc LVH; mild MR; trace TR with normal RVSP  . TUBAL LIGATION      Social History   Socioeconomic History  . Marital status: Married    Spouse name: Not on file  . Number of children: 3  . Years of education: Not on file  . Highest education level: Not on file  Occupational History  . Occupation: Patent attorney: [employer]  Social Needs  . Financial resource strain: Not on file  . Food insecurity:    Worry: Not on file    Inability: Not on file  . Transportation needs:    Medical: Not on file    Non-medical: Not on file  Tobacco Use  . Smoking status: Never Smoker  . Smokeless tobacco: Never Used  Substance and Sexual Activity  . Alcohol use: Yes    Comment: up to 5 times/week, 1 glass of red wine  . Drug use: No  . Sexual activity: Yes    Partners: Male  Lifestyle  . Physical activity:    Days per week: Not on file    Minutes per session: Not on file  . Stress: Not on file  Relationships  . Social connections:    Talks on phone: Not on file    Gets together: Not on file    Attends religious service: Not on file    Active member of club or organization: Not on file    Attends meetings of clubs or organizations: Not on file    Relationship status: Not on file  . Intimate partner violence:    Fear of current or ex partner: Not on file    Emotionally abused: Not on file    Physically abused: Not on file    Forced sexual activity: Not on file  Other Topics Concern  . Not on file  Social History Narrative   Married.  Lives with her husband.  Oldest daughter lives in  [place], son and daughter lives in [place], [place].  5 grandchildren.  (lost 1 daughter at the age of 2.5 yo due to illness)    Family History  Problem Relation Age of Onset  . Lung cancer Father   . Cancer Father   . COPD Brother   . Hyperlipidemia Brother   . Other Sister        brain tumor (benign)  . Hypertension Mother   . Hyperlipidemia Son   . Cancer Sister        stomach cancer  . Cancer Sister 64       metastatic (in abdomen), unknown primary, died 77  mos later  . Diabetes Neg Hx   . Colon cancer Neg Hx   . Breast cancer Neg Hx     Outpatient Encounter Medications as of 10/09/2017  Medication Sig Note  . CALCIUM PO Take 500 mg by mouth daily.   . Cholecalciferol (VITAMIN D PO) Take 1,000 Units by mouth daily.    Marland Kitchen glucosamine-chondroitin 500-400 MG tablet Take 1 tablet by mouth daily.   . hydroxychloroquine (PLAQUENIL) 200 MG tablet TAKE ONE TABLET BY MOUTH ONE TIME DAILY  10/09/2017: Taking qod only for the last week  . losartan (COZAAR) 50 MG tablet Take 1 tablet (50 mg total) by mouth daily. Please schedule appointment for refills   . Omega-3 Fatty Acids (FISH OIL) 1000 MG CAPS Take 1 capsule by mouth daily.   . [DISCONTINUED] Misc Natural Products (JOINT HEALTH) CAPS Take by mouth 3 (three) times daily.    No facility-administered encounter medications on file as of 10/09/2017.     Allergies  Allergen Reactions  . Septra [Bactrim] Swelling    Couldn't breathe, lip swelling  . Asa [Aspirin] Rash    Upset stomach    ROS: The patient denies anorexia, fever, vision changes, decreased hearing, ear pain, sore throat, breast concerns, chest pain, dizziness, syncope, dyspnea on exertion, cough, swelling, nausea, vomiting, diarrhea, constipation, abdominal pain, melena, hematochezia, indigestion/heartburn, hematuria, incontinence, dysuria, postmenopausal bleeding, vaginal discharge, odor or itch, genital lesions, weakness, tremor, suspicious skin lesions, abnormal  bleeding/bruising, or enlarged lymph nodes. Ringing in her right ear (not new); denies hearing loss. No longer has hot flashes.  Raynaud's in winter only, mild. R knee pain per HPI. Palpitations resolved. Headaches resolved. She recently chipped her tooth. Last week she had slight lower abdominal discomfort, x 5-10 minutes, several times last week, resolved.  No dysuria, hematuria, no constipation or bowel changes.   Weight gain related to lack of exercise--has been coming down since she resumed regular exercise.   PHYSICAL EXAM:  BP 126/86   Pulse 64   Ht 5' 1.75" (1.568 m)   Wt 162 lb 3.2 oz (73.6 kg)   BMI 29.91 kg/m  120/70 on repeat by MD  Wt Readings from Last 3 Encounters:  10/09/17 162 lb 3.2 oz (73.6 kg)  07/19/17 166 lb (75.3 kg)  05/22/17 161 lb 9.6 oz (73.3 kg)   155# 3.2 oz at CPE last year  General Appearance:   Alert, cooperative, no distress, appears stated age  Head:   Normocephalic, without obvious abnormality, atraumatic  Eyes:   PERRL, conjunctiva/corneas clear, EOM's intact, fundi benign  Ears:   Normal TM's and external ear canals  Nose:  Nares normal, mucosa normal, no drainage or sinus tenderness  Throat:  Lips, mucosa, and tongue normal; teeth and gums normal  Neck:  Supple, no lymphadenopathy; thyroid: noenlargement/ tenderness/nodules; no carotid bruit or JVD  Back:  Spine nontender, no curvature, ROM normal, no CVAtenderness  Lungs:   Clear to auscultation bilaterally without wheezes, rales or ronchi; respirations unlabored  Chest Wall:   No tenderness or deformity  Heart:   Regular rate and rhythm, S1 and S2 normal, no murmur, rub or gallop  Breast Exam:   No tenderness, masses, or nipple discharge or inversion.No axillary lymphadenopathy. WHSS right upper breast  Abdomen:   Soft, non-tender, nondistended, normoactive bowel sounds, no masses, no hepatosplenomegaly  Genitalia:   Normal external  genitalia without lesions. BUS and vagina normal, some atrophic changes; no cervical lesions, just small ectropion inferiorly; no discharge or  cervical motion tenderness. No abnormal vaginal discharge. Uterus and adnexa not enlarged, nontender, no masses. Pap performed  Rectal:   Normal tone, no masses or tenderness; no stool in vault to heme-test  Extremities:  No clubbing, cyanosis or edema.   Pulses:  2+ and symmetric all extremities  Skin:  Skin color, texture, turgor normal, no rashes or lesions  Lymph nodes:  Cervical, supraclavicular, and axillary nodes normal  Neurologic:  CNII-XII intact, normal strength, gait; reflexes 2+ and symmetric throughout.    Psych: Normal mood, affect, hygiene and grooming    ASSESSMENT/PLAN:  Annual physical exam - Plan: POCT Urinalysis DIP ([facility] Device), Basic metabolic panel, Cytology - PAP([facility])  Apical variant hypertrophic cardiomyopathy ([place])  Discoid lupus erythematosus - well controlled; encouraged compliance with her meds  Osteopenia of lumbar spine - cont Ca, D, weight-bearing exercise  Medication monitoring encounter - Plan: Basic metabolic panel  Osteoarthritis of right knee, unspecified osteoarthritis type - to limit weights on treadmill, more bike than treadmill if having pain; weight loss encouraged. F/u with Dr. Keturah Barre for another injection if worsens   Pap b-met (due to elevated K+ in Feb)    Discussed monthly self breast exams and yearly mammograms; at least 30 minutes of aerobic activity at least 5 days/week, weight-bearing exercise at least 2x/wk; proper sunscreen use reviewed; healthy diet, including goals of calcium and vitamin D intake and alcohol recommendations (less than or equal to 1 drink/day) reviewed; regular seatbelt use; changing batteries in smoke detectors. Colonoscopy UTD, due again 2020. Immunizations--continue yearly flu shots. Prevnar-13 at age 33. Pap and  HPV today.  F/u 1 year, sooner prn.

## 2017-10-09 ENCOUNTER — Encounter: Payer: Self-pay | Admitting: Family Medicine

## 2017-10-09 ENCOUNTER — Other Ambulatory Visit: Payer: Self-pay | Admitting: Physician Assistant

## 2017-10-09 ENCOUNTER — Ambulatory Visit (INDEPENDENT_AMBULATORY_CARE_PROVIDER_SITE_OTHER): Payer: 59 | Admitting: Family Medicine

## 2017-10-09 ENCOUNTER — Other Ambulatory Visit (HOSPITAL_COMMUNITY)
Admission: RE | Admit: 2017-10-09 | Discharge: 2017-10-09 | Disposition: A | Discharge status: Home / Self Care | Payer: 59 | Source: Ambulatory Visit | Attending: Family Medicine | Admitting: Family Medicine

## 2017-10-09 VITALS — BP 120/70 | HR 64 | Ht 61.75 in | Wt 162.2 lb

## 2017-10-09 DIAGNOSIS — I422 Other hypertrophic cardiomyopathy: Secondary | ICD-10-CM | POA: Diagnosis not present

## 2017-10-09 DIAGNOSIS — L930 Discoid lupus erythematosus: Secondary | ICD-10-CM

## 2017-10-09 DIAGNOSIS — M8588 Other specified disorders of bone density and structure, other site: Secondary | ICD-10-CM | POA: Diagnosis not present

## 2017-10-09 DIAGNOSIS — Z5181 Encounter for therapeutic drug level monitoring: Secondary | ICD-10-CM

## 2017-10-09 DIAGNOSIS — I10 Essential (primary) hypertension: Secondary | ICD-10-CM | POA: Insufficient documentation

## 2017-10-09 DIAGNOSIS — M1711 Unilateral primary osteoarthritis, right knee: Secondary | ICD-10-CM | POA: Insufficient documentation

## 2017-10-09 DIAGNOSIS — Z0000 Encounter for general adult medical examination without abnormal findings: Secondary | ICD-10-CM | POA: Diagnosis not present

## 2017-10-09 LAB — POCT URINALYSIS DIP (PROADVANTAGE DEVICE)
Bilirubin, UA: NEGATIVE
Blood, UA: NEGATIVE
Glucose, UA: NEGATIVE mg/dL
Ketones, POC UA: NEGATIVE mg/dL
Leukocytes, UA: NEGATIVE
Nitrite, UA: NEGATIVE
Protein Ur, POC: NEGATIVE mg/dL
Specific Gravity, Urine: 1.02
Urobilinogen, Ur: NEGATIVE
pH, UA: 6 (ref 5.0–8.0)

## 2017-10-09 NOTE — Patient Instructions (Signed)
HEALTH MAINTENANCE RECOMMENDATIONS:  It is recommended that you get at least 30 minutes of aerobic exercise at least 5 days/week (for weight loss, you may need as much as 60-90 minutes). This can be any activity that gets your heart rate up. This can be divided in 10-15 minute intervals if needed, but try and build up your endurance at least once a week.  Weight bearing exercise is also recommended twice weekly.  Eat a healthy diet with lots of vegetables, fruits and fiber.  "Colorful" foods have a lot of vitamins (ie green vegetables, tomatoes, red peppers, etc).  Limit sweet tea, regular sodas and alcoholic beverages, all of which has a lot of calories and sugar.  Up to 1 alcoholic drink daily may be beneficial for women (unless trying to lose weight, watch sugars).  Drink a lot of water.  Calcium recommendations are 1200-1500 mg daily (1500 mg for postmenopausal women or women without ovaries), and vitamin D 1000 IU daily.  This should be obtained from diet and/or supplements (vitamins), and calcium should not be taken all at once, but in divided doses.  Monthly self breast exams and yearly mammograms for women over the age of 61 is recommended.  Sunscreen of at least SPF 30 should be used on all sun-exposed parts of the skin when outside between the hours of 10 am and 4 pm (not just when at beach or pool, but even with exercise, golf, tennis, and yard work!)  Use a sunscreen that says "broad spectrum" so it covers both UVA and UVB rays, and make sure to reapply every 1-2 hours.  Remember to change the batteries in your smoke detectors when changing your clock times in the spring and fall.  Use your seat belt every time you are in a car, and please drive safely and not be distracted with cell phones and texting while driving.   MyPlate from USDA The general, healthful diet is based on the 2010 Dietary Guidelines for Americans. The amount of food you need to eat from each food group depends on  your age, sex, and level of physical activity and can be individualized by a dietitian. Go to [employer]hu for more information. What do I need to know about the MyPlate plan?  Enjoy your food, but eat less.  Avoid oversized portions. ?  of your plate should include fruits and vegetables. ?  of your plate should be grains. ?  of your plate should be protein. Grains  Make at least half of your grains whole grains.  For a 2,000 calorie daily food plan, eat 6 oz every day.  1 oz is about 1 slice bread, 1 cup cereal, or  cup cooked rice, cereal, or pasta. Vegetables  Make half your plate fruits and vegetables.  For a 2,000 calorie daily food plan, eat 2 cups every day.  1 cup is about 1 cup raw or cooked vegetables or vegetable juice or 2 cups raw leafy greens. Fruits  Make half your plate fruits and vegetables.  For a 2,000 calorie daily food plan, eat 2 cups every day.  1 cup is about 1 cup fruit or 100% fruit juice or  cup dried fruit. Protein  For a 2,000 calorie daily food plan, eat 5 oz every day.  1 oz is about 1 oz meat, poultry, or fish,  cup cooked beans, 1 egg, 1 Tbsp peanut butter, or  oz nuts or seeds. Dairy  Switch to fat-free or low-fat (1%) milk.  For a  2,000 calorie daily food plan, eat 3 cups every day.  1 cup is about 1 cup milk or yogurt or soy milk (soy beverage), 1 oz natural cheese, or 2 oz processed cheese. Fats, Oils, and Empty Calories  Only small amounts of oils are recommended.  Empty calories are calories from solid fats or added sugars.  Compare sodium in foods like soup, bread, and frozen meals. Choose the foods with lower numbers.  Drink water instead of sugary drinks. What foods can I eat? Grains Whole grains such as whole wheat, quinoa, millet, and bulgur. Bread, rolls, and pasta made from whole grains. Brown or wild rice. Hot or cold cereals made from whole grains and without added sugar. Vegetables All fresh  vegetables, especially fresh red, dark green, or orange vegetables. Peas and beans. Low-sodium frozen or canned vegetables prepared without added salt. Low-sodium vegetable juices. Fruits All fresh, frozen, and dried fruits. Canned fruit packed in water or fruit juice without added sugar. Fruit juices without added sugar. Meats and Other Protein Sources Boiled, baked, or grilled lean meat trimmed of fat. Skinless poultry. Fresh seafood and shellfish. Canned seafood packed in water. Unsalted nuts and unsalted nut butters. Tofu. Dried beans and pea. Eggs. Dairy Low-fat or fat-free milk, yogurt, and cheeses. Sweets and Desserts Frozen desserts made from low-fat milk. Fats and Oils Olive, peanut, and canola oils and margarine. Salad dressing and mayonnaise made from these oils. Other Soups and casseroles made from allowed ingredients and without added fat or salt. The items listed above may not be a complete list of recommended foods or beverages. Contact your dietitian for more options. What foods are not recommended? Grains Sweetened, low-fiber cereals. Packaged baked goods. Snack crackers and chips. Cheese crackers, butter crackers, and biscuits. Frozen waffles, sweet breads, doughnuts, pastries, packaged baking mixes, pancakes, cakes, and cookies. Vegetables Regular canned or frozen vegetables or vegetables prepared with salt. Canned tomatoes. Canned tomato sauce. Fried vegetables. Vegetables in cream sauce or cheese sauce. Fruits Fruits packed in syrup or made with added sugar. Meats and Other Protein Sources Marbled or fatty meats such as ribs. Poultry with skin. Fried meats, poultry, eggs, or fish. Sausages, hot dogs, and deli meats such as pastrami, bologna, or salami. Dairy Whole milk, cream, cheeses made from whole milk, sour cream. Ice cream or yogurt made from whole milk or with added sugar. Beverages For adults, no more than one alcoholic drink per day. Regular soft drinks or other  sugary beverages. Juice drinks. Sweets and Desserts Sugary or fatty desserts, candy, and other sweets. Fats and Oils Solid shortening or partially hydrogenated oils. Solid margarine. Margarine that contains trans fats. Butter. The items listed above may not be a complete list of foods and beverages to avoid. Contact your dietitian for more information. This information is not intended to replace advice given to you by your health care provider. Make sure you discuss any questions you have with your health care provider. Document Released: 05/29/2007 Document Revised: 10/15/2015 Document Reviewed: 04/17/2013 Elsevier Interactive Patient Education  [employer].

## 2017-10-09 NOTE — Telephone Encounter (Signed)
Last Visit: 07/19/17 Next visit: 01/18/18 Labs: 07/19/17 Potassium borderline elevated. All other labs are WNL. PLQ eye exam: 01/03/2017  Okay to refill per Dr. Estanislado Pandy

## 2017-10-10 LAB — BASIC METABOLIC PANEL
BUN/Creatinine Ratio: 21 (ref 12–28)
BUN: 16 mg/dL (ref 8–27)
CO2: 25 mmol/L (ref 20–29)
Calcium: 9.3 mg/dL (ref 8.7–10.3)
Chloride: 105 mmol/L (ref 96–106)
Creatinine, Ser: 0.78 mg/dL (ref 0.57–1.00)
GFR calc Af Amer: 93 mL/min/{1.73_m2} (ref >59)
GFR calc non Af Amer: 81 mL/min/{1.73_m2} (ref >59)
Glucose: 98 mg/dL (ref 65–99)
Potassium: 4.6 mmol/L (ref 3.5–5.2)
Sodium: 143 mmol/L (ref 134–144)

## 2017-10-10 LAB — CYTOLOGY - PAP
Diagnosis: NEGATIVE
HPV: NOT DETECTED

## 2017-12-02 DIAGNOSIS — Z1231 Encounter for screening mammogram for malignant neoplasm of breast: Secondary | ICD-10-CM | POA: Diagnosis not present

## 2017-12-02 LAB — HM MAMMOGRAPHY

## 2017-12-05 ENCOUNTER — Encounter: Payer: Self-pay | Admitting: Family Medicine

## 2017-12-12 IMAGING — MR MR HEAD WO/W CM
10 series · 48 of 48 positions shown · IV contrast (multihance)
Comparison: None.

CLINICAL DATA: Headaches with intercourse.  Rheumatoid arthritis.

EXAM:
MRI HEAD WITHOUT AND WITH CONTRAST
TECHNIQUE: Multiplanar, multiecho pulse sequences of the brain and surrounding
structures were obtained without and with intravenous contrast.
CONTRAST:  15 mL MultiHance.
Creatinine was obtained on site at [HOSPITAL] at [HOSPITAL]. Results: Creatinine 0.8 mg/dL.

[Series 5: T1 · sagittal · 4.0mm · 0.75mm/px · 3 of 29 slices shown (1 of 3)]
[im 1/29]
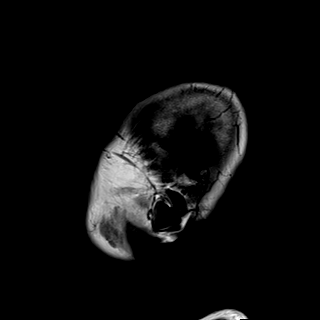
[im 15/29]
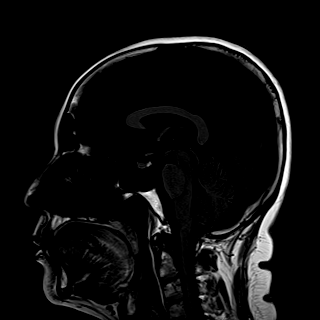
[im 29/29]
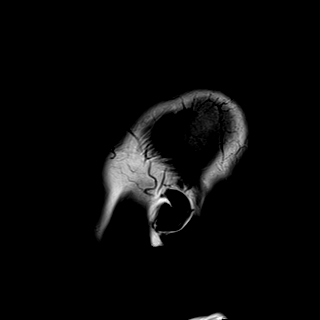

[Series 6: DWI · axial · 3.0mm · 1.44mm/px · z∈[-23,+117]mm · 7 of 88 slices shown (1 of 2)]
[im 1/88]
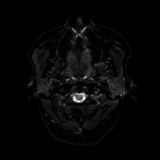
[im 15/88]
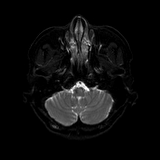
[im 30/88]
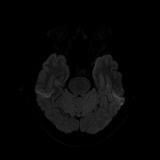
[im 44/88]
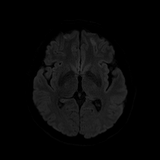
[im 59/88]
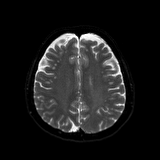
[im 73/88]
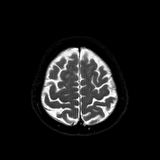
[im 88/88]
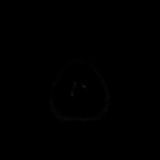

[Series 7: DWI · axial · 3.0mm · 1.44mm/px · z∈[-23,+117]mm · 3 of 44 slices shown (2 of 2)]
[im 1/44]
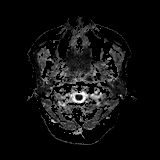
[im 22/44]
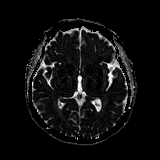
[im 44/44]
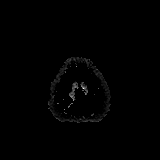

[Series 8: T2 · axial · 4.0mm · 0.36mm/px · z∈[-24,+120]mm · 2 of 29 slices shown]
[im 1/29]
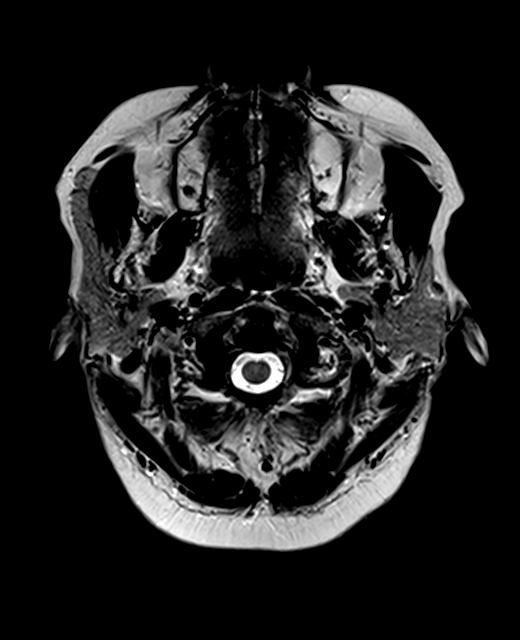
[im 29/29]
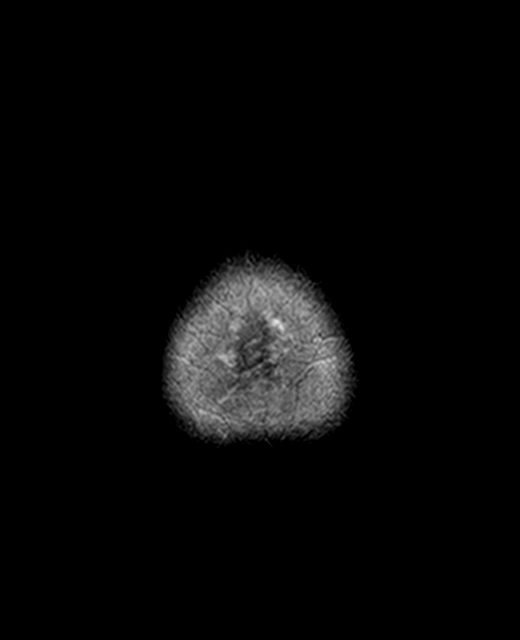

[Series 9: FLAIR · axial · 3.0mm · 0.72mm/px · z∈[-27,+121]mm · 2 of 26 slices shown]
[im 1/26]
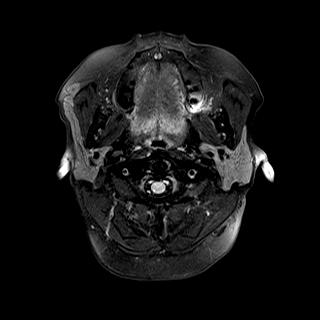
[im 26/26]
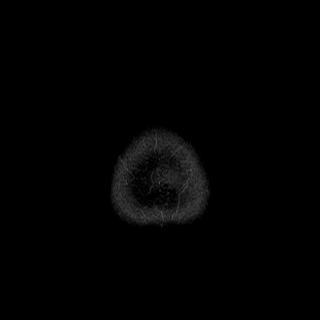

[Series 11: swi_images · axial · 1.5mm · 0.90mm/px · z∈[-23,+118]mm · 7 of 96 slices shown]
[im 1/96]
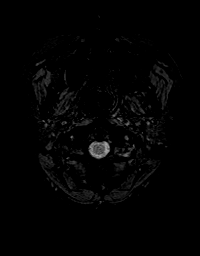
[im 16/96]
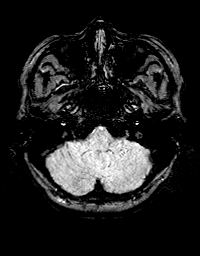
[im 32/96]
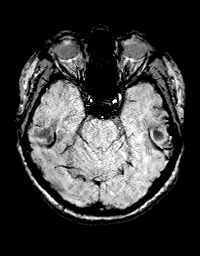
[im 48/96]
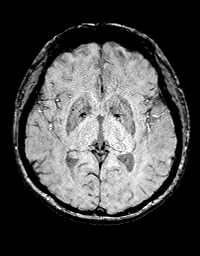
[im 64/96]
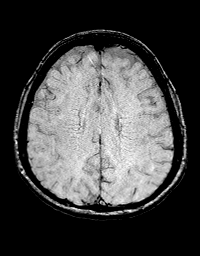
[im 80/96]
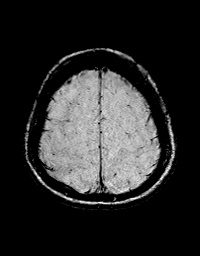
[im 96/96]
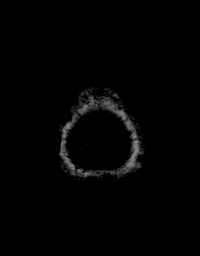

[Series 12: T1 · axial · 1.0mm · 0.90mm/px · z∈[-23,+119]mm · 10 of 144 slices shown (2 of 3)]
[im 1/144]
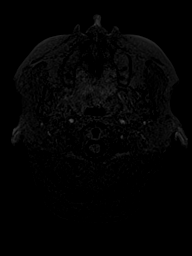
[im 16/144]
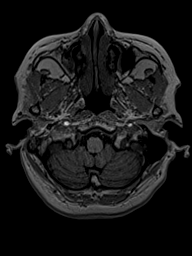
[im 32/144]
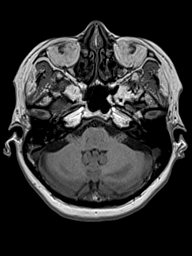
[im 48/144]
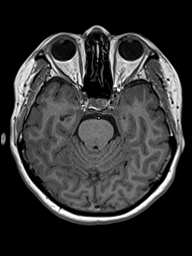
[im 64/144]
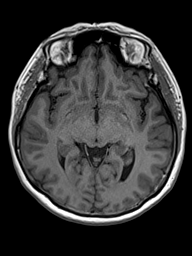
[im 80/144]
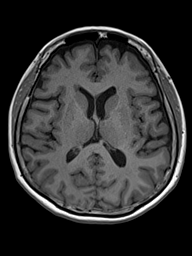
[im 96/144]
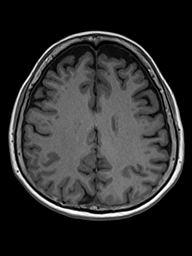
[im 112/144]
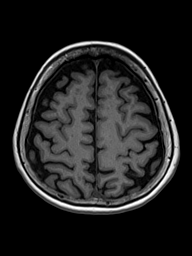
[im 128/144]
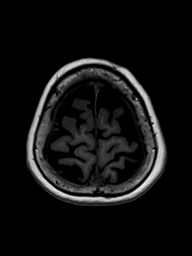
[im 144/144]
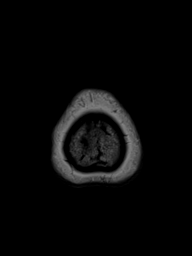

[Series 13: T2 post-contrast · coronal · 4.0mm · 0.36mm/px · 2 of 31 slices shown]
[im 1/31]
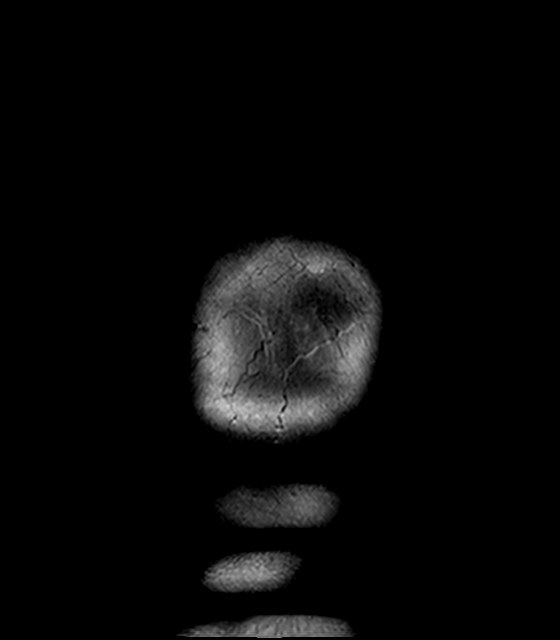
[im 31/31]
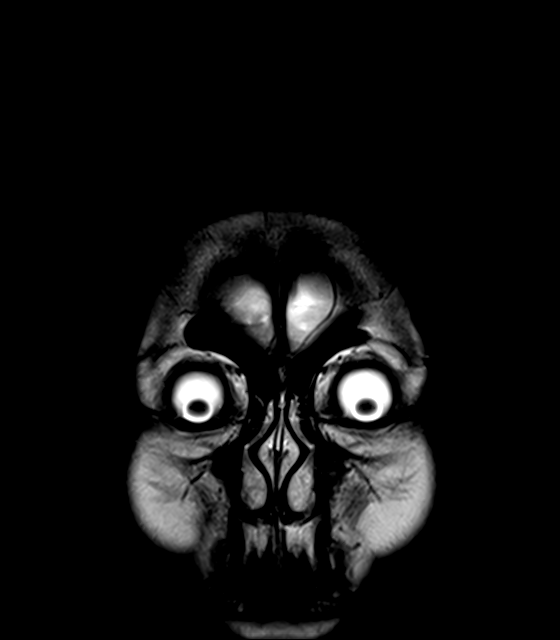

[Series 14: T1 · axial · 1.0mm · 0.90mm/px · z∈[-23,+119]mm · 10 of 144 slices shown (3 of 3)]
[im 1/144]
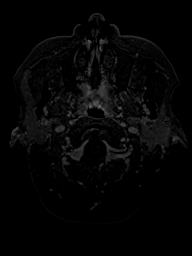
[im 16/144]
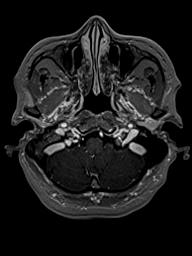
[im 32/144]
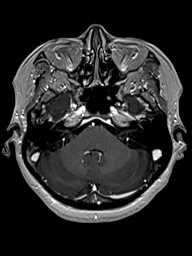
[im 48/144]
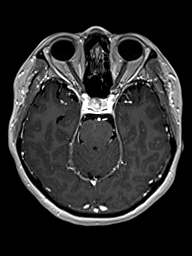
[im 64/144]
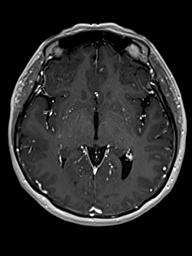
[im 80/144]
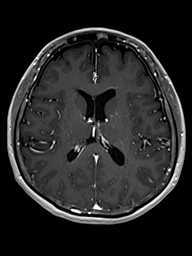
[im 96/144]
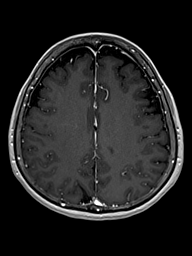
[im 112/144]
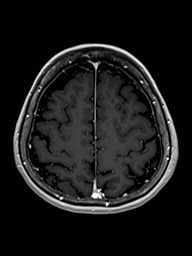
[im 128/144]
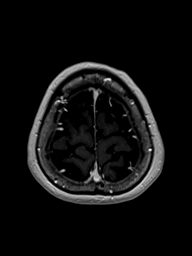
[im 144/144]
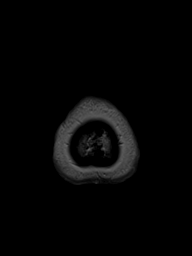

[Series 15: T1 post-contrast · coronal · 4.0mm · 0.72mm/px · 2 of 31 slices shown]
[im 1/31]
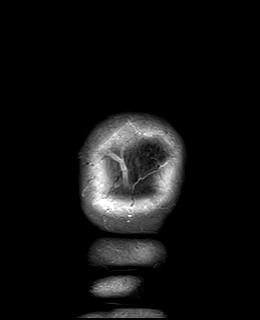
[im 31/31]
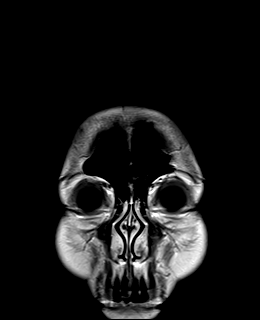

[48 of 48 positions shown; findings below may reference images not displayed]

FINDINGS: Brain: No evidence for acute infarction, hemorrhage, mass lesion,
hydrocephalus, or extra-axial fluid. Normal for age cerebral volume.
Mild subcortical and periventricular T2 and FLAIR hyperintensities,
likely chronic microvascular ischemic change. Vasculitis is not
considered a likely etiology, as there is no significant large
vessel or remote chronic cortical infarct.

Post infusion, no abnormal enhancement of the brain or meninges.

Vascular: Flow voids are maintained throughout the carotid, basilar,
and vertebral arteries. There are no areas of chronic hemorrhage.
Within limits for assessment on routine brain MR, no visible
saccular aneurysm.

Skull and upper cervical spine: Unremarkable visualized calvarium,
skullbase, and cervical vertebrae. Pituitary, pineal, cerebellar
tonsils unremarkable. No upper cervical cord lesions. No pannus.

Sinuses/Orbits: No orbital masses or proptosis. Globes appear
symmetric. Sinuses appear well aerated, without evidence for
air-fluid level. Hypoplastic BILATERAL maxillary sinuses.

Other: Non worrisome appearing RIGHT mastoid effusion. No
nasopharyngeal lesion.
IMPRESSION: Mild subcortical and periventricular T2 and FLAIR hyperintensities,
likely chronic microvascular ischemic change.

No acute intracranial findings. No abnormal postcontrast
enhancement.

## 2018-01-01 ENCOUNTER — Emergency Department (HOSPITAL_COMMUNITY)
Admission: EM | Admit: 2018-01-01 | Discharge: 2018-01-01 | Disposition: A | Discharge status: Home / Self Care | Payer: 59 | Attending: Emergency Medicine | Admitting: Emergency Medicine

## 2018-01-01 ENCOUNTER — Emergency Department (HOSPITAL_COMMUNITY): Payer: 59

## 2018-01-01 ENCOUNTER — Telehealth: Payer: Self-pay | Admitting: Internal Medicine

## 2018-01-01 DIAGNOSIS — M3210 Systemic lupus erythematosus, organ or system involvement unspecified: Secondary | ICD-10-CM | POA: Diagnosis not present

## 2018-01-01 DIAGNOSIS — I499 Cardiac arrhythmia, unspecified: Secondary | ICD-10-CM | POA: Diagnosis not present

## 2018-01-01 DIAGNOSIS — Z79899 Other long term (current) drug therapy: Secondary | ICD-10-CM | POA: Diagnosis not present

## 2018-01-01 DIAGNOSIS — R079 Chest pain, unspecified: Secondary | ICD-10-CM | POA: Diagnosis not present

## 2018-01-01 DIAGNOSIS — R002 Palpitations: Secondary | ICD-10-CM | POA: Diagnosis present

## 2018-01-01 DIAGNOSIS — I10 Essential (primary) hypertension: Secondary | ICD-10-CM | POA: Diagnosis not present

## 2018-01-01 DIAGNOSIS — I471 Supraventricular tachycardia: Secondary | ICD-10-CM

## 2018-01-01 DIAGNOSIS — R9431 Abnormal electrocardiogram [ECG] [EKG]: Secondary | ICD-10-CM | POA: Diagnosis not present

## 2018-01-01 LAB — I-STAT TROPONIN, ED: Troponin i, poc: 0.01 ng/mL (ref 0.00–0.08)

## 2018-01-01 LAB — BASIC METABOLIC PANEL
Anion gap: 10 (ref 5–15)
BUN: 16 mg/dL (ref 8–23)
CO2: 26 mmol/L (ref 22–32)
Calcium: 8.8 mg/dL — ABNORMAL LOW (ref 8.9–10.3)
Chloride: 98 mmol/L (ref 98–111)
Creatinine, Ser: 0.67 mg/dL (ref 0.44–1.00)
GFR calc Af Amer: >60 mL/min (ref >60)
GFR calc non Af Amer: >60 mL/min (ref >60)
Glucose, Bld: 92 mg/dL (ref 70–99)
Potassium: 3.7 mmol/L (ref 3.5–5.1)
Sodium: 134 mmol/L — ABNORMAL LOW (ref 135–145)

## 2018-01-01 LAB — CBC
HCT: 42.7 % (ref 36.0–46.0)
Hemoglobin: 14.1 g/dL (ref 12.0–15.0)
MCH: 29.4 pg (ref 26.0–34.0)
MCHC: 33 g/dL (ref 30.0–36.0)
MCV: 89.1 fL (ref 78.0–100.0)
Platelets: 210 10*3/uL (ref 150–400)
RBC: 4.79 MIL/uL (ref 3.87–5.11)
RDW: 13.7 % (ref 11.5–15.5)
WBC: 5.9 10*3/uL (ref 4.0–10.5)

## 2018-01-01 MED ORDER — METOPROLOL SUCCINATE ER 25 MG PO TB24: 12.5000 mg | ORAL_TABLET | Freq: Every day | ORAL | 0 refills | Status: DC

## 2018-01-01 MED ORDER — SODIUM CHLORIDE 0.9 % IV BOLUS
500.0000 mL | Freq: Once | INTRAVENOUS | Status: AC
Start: 2018-01-01 — End: 2018-01-01
  Administered 2018-01-01: 500 mL via INTRAVENOUS

## 2018-01-01 NOTE — Telephone Encounter (Signed)
FYI: Pt's dtr calling to let us know pt had episode of heart flutters and Tachycardia and is on the way to [facility] by EMS

## 2018-01-01 NOTE — Telephone Encounter (Signed)
FYI forwarded to Dr.Hilty

## 2018-01-01 NOTE — ED Provider Notes (Signed)
[facility] DEPT Provider Note   CSN: [id number] Arrival date & time: 01/01/18  1150     History   Chief Complaint Chief Complaint  Patient presents with  . Palpitations    HPI Alicia Raymond is a 62 y.o. female.  The history is provided by the patient. No language interpreter was used.  Palpitations     Alicia Raymond is a 62 y.o. female who presents to the Emergency Department complaining of palpitations. She presents to the emergency department for palpitations that began between 930 and 10 this morning. She was at work when she developed a pounding and racing sensation in her chest. Associated with this she had tingling and discomfort in her left arm in the left side of her neck. She has a history of palpitations in the past but they normally resolved without problems. She does also have a history of SVT. On EMS arrival she was noted to be and SVT with a rate about 160. Unclear if she received any medications prior to ED arrival. Her palpitations and chest pain have resolved. She does endorse feeling fatigued currently. Past Medical History:  Diagnosis Date  . Apical variant hypertrophic cardiomyopathy (HCC)    Dr. Debara Pickett  . Colon polyp   . Hypertension    Dr. Debara Pickett  . 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 Rheumatoid arthritis(714.0)    Dr. Estanislado Pandy  . Systemic lupus ([place]) 11/30/09   (pt denies systemic)    Patient Active Problem List   Diagnosis Date Noted  . Lupus ([place])   . Hypertension   . Colon polyp   . Heart palpitations 04/17/2017  . Photosensitivity 11/21/2016  . Raynaud's disease without gangrene 11/21/2016  . High risk medication use 11/21/2016  . 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 Primary osteoarthritis of both hands 03/25/2016  . Arrhythmia 03/25/2016  . Atypical chest pain 11/21/2013  . Apical variant hypertrophic cardiomyopathy ([place]) 11/09/2012  . Essential hypertension 11/09/2012  . Systemic lupus ([place]) 11/30/2009     Past Surgical History:  Procedure Laterality Date  . BREAST BIOPSY Right 1988   fibroadenoma (benign)  . CARDIAC CATHETERIZATION  04/12/2010   LAD with 30% narrowing - minimal CAD (Dr. Alma Friendly)  . TRANSTHORACIC ECHOCARDIOGRAM  03/2010   EF=>55%, apical hypertrophy, mod conc LVH; mild MR; trace TR with normal RVSP  . TUBAL LIGATION       OB History    Gravida  5   Para  4   Term      Preterm      AB  1   Living  3     SAB  1   TAB      Ectopic      Multiple      Live Births           Obstetric Comments  One daughter died at 44.23 years old         Home Medications    Prior to Admission medications   Medication Sig Start Date End Date Taking? Authorizing Provider  aspirin 325 MG EC tablet Take 650 mg by mouth daily as needed for pain.   Yes [provider]  CALCIUM PO Take 1 tablet by mouth daily.    Yes [provider]  Cholecalciferol (VITAMIN D PO) Take 1 capsule by mouth daily.    Yes [provider]  hydroxychloroquine (PLAQUENIL) 200 MG tablet TAKE ONE TABLET BY MOUTH ONE TIME DAILY  Patient taking  differently: Take 200 mg by mouth daily.  10/09/17  Yes Deveshwar, Abel Presto, MD  ibuprofen (ADVIL,MOTRIN) 200 MG tablet Take 400 mg by mouth every 6 (six) hours as needed for mild pain or moderate pain.   Yes [provider]  losartan (COZAAR) 50 MG tablet Take 1 tablet (50 mg total) by mouth daily. Please schedule appointment for refills 03/24/17  Yes Hilty, Nadean Corwin, MD  Omega-3 Fatty Acids (FISH OIL) 1000 MG CAPS Take 1 capsule by mouth daily.   Yes [provider]  metoprolol succinate (TOPROL-XL) 25 MG 24 hr tablet Take 0.5 tablets (12.5 mg total) by mouth daily. 01/01/18   Quintella Reichert, MD    Family History Family History  Problem Relation Age of Onset  . Lung cancer Father   . Cancer Father   . COPD Brother   . Hyperlipidemia Brother   . Other Sister        brain tumor (benign)  . Hypertension  Mother   . Hyperlipidemia Son   . Cancer Sister        stomach cancer  . Cancer Sister 59       metastatic (in abdomen), unknown primary, died 2 mos later  . Diabetes Neg Hx   . Colon cancer Neg Hx   . Breast cancer Neg Hx     Social History Social History   Tobacco Use  . Smoking status: Never Smoker  . Smokeless tobacco: Never Used  Substance Use Topics  . Alcohol use: Yes    Comment: up to 5 times/week, 1 glass of red wine  . Drug use: No     Allergies   Septra [bactrim] and Asa [aspirin]   Review of Systems Review of Systems  Cardiovascular: Positive for palpitations.  All other systems reviewed and are negative.    Physical Exam Updated Vital Signs BP 124/78   Pulse 77   Temp 97.6 F (36.4 C)   Resp 14   SpO2 100%   Physical Exam  Constitutional: She is oriented to person, place, and time. She appears well-developed and well-nourished.  HENT:  Head: Normocephalic and atraumatic.  Cardiovascular: Normal rate and regular rhythm.  No murmur heard. Pulmonary/Chest: Effort normal and breath sounds normal. No respiratory distress.  Abdominal: Soft. There is no tenderness. There is no rebound and no guarding.  Musculoskeletal: She exhibits no edema or tenderness.  Neurological: She is alert and oriented to person, place, and time.  Skin: Skin is warm and dry.  Psychiatric: She has a normal mood and affect. Her behavior is normal.  Nursing note and vitals reviewed.    ED Treatments / Results  Labs (all labs ordered are listed, but only abnormal results are displayed) Labs Reviewed  BASIC METABOLIC PANEL - Abnormal; Notable for the following components:      Result Value   Sodium 134 (*)    Calcium 8.8 (*)    All other components within normal limits  CBC  I-STAT TROPONIN, ED    EKG EKG Interpretation  Date/Time:  Monday January 01 2018 12:18:07 EDT Ventricular Rate:  76 PR Interval:    QRS Duration: 94 QT Interval:  437 QTC  Calculation: 492 R Axis:   75 Text Interpretation:  Sinus rhythm Probable left atrial enlargement Abnormal R-wave progression, early transition Repol abnrm suggests ischemia, diffuse leads no prior available for comparison Confirmed by Quintella Reichert [phone]) on 01/01/2018 12:35:54 PM Also confirmed by Quintella Reichert [phone]), editor Shon Hale [phone])  on 01/01/2018 1:59:54  PM   Radiology Dg Chest 2 View  Result Date: 01/01/2018 CLINICAL DATA:  Chest pain, palpitations EXAM: CHEST - 2 VIEW COMPARISON:  09/12/2014 FINDINGS: Heart and mediastinal contours are within normal limits. No focal opacities or effusions. No acute bony abnormality. IMPRESSION: No active cardiopulmonary disease. Electronically Signed   By: Rolm Baptise M.D.   On: 01/01/2018 13:13    Procedures Procedures (including critical care time)  Medications Ordered in ED Medications  sodium chloride 0.9 % bolus 500 mL (0 mLs Intravenous Stopped 01/01/18 1429)     Initial Impression / Assessment and Plan / ED Course  I have reviewed the triage vital signs and the nursing notes.  Pertinent labs & imaging results that were available during my care of the patient were reviewed by me and considered in my medical decision making (see chart for details).    Patient with history of apical hypertrophic cardiomyopathy here for evaluation of palpitations with chest pain. Her symptoms are completely resolved on ED arrival. EKG is similar when compared to priors in epic. Labs without any acute electrolyte abnormalities were anemia. Troponin is normal. Discussed with Dr. Debara Pickett with cardiology, plan to start Toprol-XL with close outpatient follow-up and return precautions.  Pre-hospital rhythm strip reviewed. Rhythm consistent with SVT with heart rate between 151 and 160  Current presentation is not consistent with PE, ACS, dissection. Final Clinical Impressions(s) / ED Diagnoses   Final diagnoses:  SVT (supraventricular  tachycardia) [facility])    ED Discharge Orders         Ordered    metoprolol succinate (TOPROL-XL) 25 MG 24 hr tablet  Daily     01/01/18 1414           Quintella Reichert, MD 01/01/18 [phone]

## 2018-01-01 NOTE — ED Triage Notes (Signed)
Transported by [facility] from work--sudden onset of SVT (history of such), rate initially 160 bpm and lasted approximately 30 minutes. Upon arrival to ED, HR in the 90s/80s.

## 2018-01-01 NOTE — ED Notes (Signed)
Bed: WA08 Expected date:  Expected time:  Means of arrival:  Comments: EMS 

## 2018-01-09 NOTE — Progress Notes (Deleted)
Office Visit Note  Patient: Alicia Raymond             Date of Birth: [birth year]           MRN: [id number]             PCP: Rita Ohara, MD Referring: Rita Ohara, MD Visit Date: 01/18/2018 Occupation: @GUAROCC @  Subjective:  No chief complaint on file.   History of Present Illness: Alicia Raymond is a 62 y.o. female ***   Activities of Daily Living:  Patient reports morning stiffness for *** {minute/hour:19697}.   Patient {ACTIONS;DENIES/REPORTS:21021675::"Denies"} nocturnal pain.  Difficulty dressing/grooming: {ACTIONS;DENIES/REPORTS:21021675::"Denies"} Difficulty climbing stairs: {ACTIONS;DENIES/REPORTS:21021675::"Denies"} Difficulty getting out of chair: {ACTIONS;DENIES/REPORTS:21021675::"Denies"} Difficulty using hands for taps, buttons, cutlery, and/or writing: {ACTIONS;DENIES/REPORTS:21021675::"Denies"}  No Rheumatology ROS completed.   PMFS History:  Patient Active Problem List   Diagnosis Date Noted  . Lupus ([place])   . Hypertension   . Colon polyp   . Heart palpitations 04/17/2017  . Photosensitivity 11/21/2016  . Raynaud's disease without gangrene 11/21/2016  . High risk medication use 11/21/2016  . 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 Primary osteoarthritis of both hands 03/25/2016  . Arrhythmia 03/25/2016  . Atypical chest pain 11/21/2013  . Apical variant hypertrophic cardiomyopathy ([place]) 11/09/2012  . Essential hypertension 11/09/2012  . Systemic lupus ([place]) 11/30/2009    Past Medical History:  Diagnosis Date  . Apical variant hypertrophic cardiomyopathy (HCC)    Dr. Debara Pickett  . Colon polyp   . Hypertension    Dr. Debara Pickett  . 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 Rheumatoid arthritis(714.0)    Dr. Estanislado Pandy  . Systemic lupus ([place]) 11/30/09   (pt denies systemic)    Family History  Problem Relation Age of Onset  . Lung cancer Father   . Cancer Father   . COPD Brother   . Hyperlipidemia Brother   . Other Sister        brain tumor (benign)  . Hypertension  Mother   . Hyperlipidemia Son   . Cancer Sister        stomach cancer  . Cancer Sister 43       metastatic (in abdomen), unknown primary, died 2 mos later  . Diabetes Neg Hx   . Colon cancer Neg Hx   . Breast cancer Neg Hx    Past Surgical History:  Procedure Laterality Date  . BREAST BIOPSY Right 1988   fibroadenoma (benign)  . CARDIAC CATHETERIZATION  04/12/2010   LAD with 30% narrowing - minimal CAD (Dr. Alma Friendly)  . TRANSTHORACIC ECHOCARDIOGRAM  03/2010   EF=>55%, apical hypertrophy, mod conc LVH; mild MR; trace TR with normal RVSP  . TUBAL LIGATION     Social History   Social History Narrative   Married.  Lives with her husband.  Oldest daughter lives in [place], son and daughter lives in [place], [place].  5 grandchildren.  (lost 1 daughter at the age of 2.5 yo due to illness)    Objective: Vital Signs: There were no vitals taken for this visit.   Physical Exam   Musculoskeletal Exam: ***  CDAI Exam: CDAI Score: Not documented Patient Global Assessment: Not documented; Provider Global Assessment: Not documented Swollen: Not documented; Tender: Not documented Joint Exam   Not documented   There is currently no information documented on the homunculus. Go to the Rheumatology activity and complete the homunculus joint exam.  Investigation: No additional findings.  Imaging: Dg Chest 2 View  Result Date: 01/01/2018 CLINICAL  DATA:  Chest pain, palpitations EXAM: CHEST - 2 VIEW COMPARISON:  09/12/2014 FINDINGS: Heart and mediastinal contours are within normal limits. No focal opacities or effusions. No acute bony abnormality. IMPRESSION: No active cardiopulmonary disease. Electronically Signed   By: Rolm Baptise M.D.   On: 01/01/2018 13:13    Recent Labs: Lab Results  Component Value Date   WBC 5.9 01/01/2018   HGB 14.1 01/01/2018   PLT 210 01/01/2018   NA 134 (L) 01/01/2018   K 3.7 01/01/2018   CL 98 01/01/2018   CO2 26 01/01/2018   GLUCOSE 92 01/01/2018    BUN 16 01/01/2018   CREATININE 0.67 01/01/2018   BILITOT 0.5 07/19/2017   ALKPHOS 49 11/25/2016   AST 23 07/19/2017   ALT 17 07/19/2017   PROT 7.6 07/19/2017   ALBUMIN 4.1 11/25/2016   CALCIUM 8.8 (L) 01/01/2018   GFRAA >60 01/01/2018    Speciality Comments: No specialty comments available.  Procedures:  No procedures performed Allergies: Septra [bactrim] and Asa [aspirin]   Assessment / Plan:     Visit Diagnoses: No diagnosis found.   Orders: No orders of the defined types were placed in this encounter.  No orders of the defined types were placed in this encounter.   Face-to-face time spent with patient was *** minutes. Greater than 50% of time was spent in counseling and coordination of care.  Follow-Up Instructions: No follow-ups on file.   Earnestine Mealing, CMA  Note - This record has been created using Editor, commissioning.  Chart creation errors have been sought, but may not always  have been located. Such creation errors do not reflect on  the standard of medical care.

## 2018-01-16 ENCOUNTER — Other Ambulatory Visit: Payer: Self-pay | Admitting: Rheumatology

## 2018-01-16 ENCOUNTER — Other Ambulatory Visit: Payer: Self-pay | Admitting: Internal Medicine

## 2018-01-16 NOTE — Telephone Encounter (Signed)
Rx sent to pharmacy   

## 2018-01-16 NOTE — Telephone Encounter (Signed)
Last Visit: 07/19/17 Next visit: 01/18/18 Labs: 01/01/18 Calcium 8.8 and Sodium 134 PLQ eye exam: 01/03/2017  Okay to refill per Dr. Estanislado Pandy

## 2018-01-17 ENCOUNTER — Telehealth: Payer: Self-pay | Admitting: Rheumatology

## 2018-01-17 NOTE — Telephone Encounter (Signed)
Patient called requesting prescription refill of Plaquenil to be sent to [facility].

## 2018-01-17 NOTE — Telephone Encounter (Signed)
Prescription sent to the pharmacy 01/16/18. Patient advised and verbalized understanding.

## 2018-01-18 ENCOUNTER — Ambulatory Visit: Payer: 59 | Admitting: Rheumatology

## 2018-01-23 NOTE — Progress Notes (Signed)
Office Visit Note  Patient: Alicia Raymond             Date of Birth: 11/25/1955           MRN: [id number]             PCP: Rita Ohara, MD Referring: Rita Ohara, MD Visit Date: 02/06/2018 Occupation: @GUAROCC @  Subjective:  Medication monitoring   History of Present Illness: Alicia Raymond is a 62 y.o. female with history of discoid lupus and osteoarthritis.  Patient is on PLQ 200 mg by mouth daily.  She has not missed any doses recently.  She denies any recent discoid lesions or malar rash.  She tries to avoid being in sunlight and she wears sunscreen. She denies any mouth or nose sores. She denies any swollen lymph nodes.  She denies worsening hair loss recently. She denies symptoms of Raynaud's. She denies any joint pain or joint swelling. She reports she has symptoms of carpal tunnel in the right wrist.  She has had a cortisone injection in the past, which provided temporary relief. She only has intermittent numbness and tingling at this time, and she does not want another cortisone injection at this time. She denies eye dryness. She reports mouth dryness but attributes it to starting on Metoprolol recently.  She was started on Metoprolol by her PCP for treatment of palpitations.     Activities of Daily Living:  Patient reports morning stiffness for 0  none.   Patient Denies nocturnal pain.  Difficulty dressing/grooming: Denies Difficulty climbing stairs: Reports Difficulty getting out of chair: Denies Difficulty using hands for taps, buttons, cutlery, and/or writing: Denies  Review of Systems  Constitutional: Negative for fatigue.  HENT: Positive for mouth dryness. Negative for mouth sores and nose dryness.   Eyes: Negative for pain, visual disturbance and dryness.  Respiratory: Negative for cough, hemoptysis, shortness of breath and difficulty breathing.   Cardiovascular: Negative for chest pain, palpitations, hypertension and swelling in legs/feet.  Gastrointestinal:  Positive for constipation. Negative for blood in stool and diarrhea.  Endocrine: Negative for increased urination.  Genitourinary: Negative for difficulty urinating and painful urination.  Musculoskeletal: Positive for arthralgias, joint pain and muscle weakness. Negative for joint swelling, myalgias, morning stiffness, muscle tenderness and myalgias.  Skin: Negative for color change, pallor, rash, hair loss, nodules/bumps, skin tightness, ulcers and sensitivity to sunlight.  Allergic/Immunologic: Negative for susceptible to infections.  Neurological: Negative for dizziness and headaches.  Hematological: Negative for bruising/bleeding tendency and swollen glands.  Psychiatric/Behavioral: Negative for depressed mood and sleep disturbance. The patient is not nervous/anxious.     PMFS History:  Patient Active Problem List   Diagnosis Date Noted  . Lupus ([place])   . Hypertension   . Colon polyp   . Heart palpitations 04/17/2017  . Photosensitivity 11/21/2016  . Raynaud's disease without gangrene 11/21/2016  . High risk medication use 11/21/2016  . 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 Primary osteoarthritis of both hands 03/25/2016  . Arrhythmia 03/25/2016  . Atypical chest pain 11/21/2013  . 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 Essential hypertension 11/09/2012  . Systemic lupus ([place]) 11/30/2009    Past Medical History:  Diagnosis Date  . Apical variant hypertrophic cardiomyopathy (HCC)    Dr. Debara Pickett  . Colon polyp   . Hypertension    Dr. Debara Pickett  . Rheumatoid arthritis(714.0)    Dr. Estanislado Pandy  . Systemic lupus ([place]) 11/30/09   (pt denies systemic)    Family History  Problem Relation Age of Onset  . Lung cancer Father   . Cancer Father   . COPD Brother   . Hyperlipidemia Brother   . Other Sister        brain tumor (benign)  . Hypertension Mother   . Hyperlipidemia Son   . Cancer Sister        stomach cancer  . Cancer Sister 25       metastatic (in  abdomen), unknown primary, died 2 mos later  . Diabetes Neg Hx   . Colon cancer Neg Hx   . Breast cancer Neg Hx    Past Surgical History:  Procedure Laterality Date  . BREAST BIOPSY Right 1988   fibroadenoma (benign)  . CARDIAC CATHETERIZATION  04/12/2010   LAD with 30% narrowing - minimal CAD (Dr. Alma Friendly)  . TRANSTHORACIC ECHOCARDIOGRAM  03/2010   EF=>55%, apical hypertrophy, mod conc LVH; mild MR; trace TR with normal RVSP  . TUBAL LIGATION     Social History   Social History Narrative   Married.  Lives with her husband.  Oldest daughter lives in [place], son and daughter lives in [place], [place].  5 grandchildren.  (lost 1 daughter at the age of 2.5 yo due to illness)    Objective: Vital Signs: BP 114/77 (BP Location: Left Arm, Patient Position: Sitting, Cuff Size: Normal)   Pulse 65   Resp 16   Ht 5\' 2"  (1.575 m)   Wt 164 lb 12.8 oz (74.8 kg)   BMI 30.14 kg/m    Physical Exam  Constitutional: She is oriented to person, place, and time. She appears well-developed and well-nourished.  HENT:  Head: Normocephalic and atraumatic.  No oral or nasal ulcerations.  No parotid swelling.  Eyes: Conjunctivae and EOM are normal.  Neck: Normal range of motion.  Cardiovascular: Normal rate, regular rhythm, normal heart sounds and intact distal pulses.  Pulmonary/Chest: Effort normal and breath sounds normal.  Abdominal: Soft. Bowel sounds are normal.  Lymphadenopathy:    She has no cervical adenopathy.  Neurological: She is alert and oriented to person, place, and time.  Skin: Skin is warm and dry. Capillary refill takes less than 2 seconds.  No discoid lesions noted.  No malar rash.  No digital ulcerations or signs of gangrene.   Psychiatric: She has a normal mood and affect. Her behavior is normal.  Nursing note and vitals reviewed.    Musculoskeletal Exam: C-spine, thoracic spine, and lumbar spine good ROM.  No midline spinal tenderness.  No SI joint tenderness.  Shoulder  joints, elbow joints, wrist joints, MCPs, PIPs, and DIPs good ROM with no synovitis. Complete fist formation bilaterally.  Hip joints, knee joints, ankle joints, MTPs, PIPs, and DIPs good ROM with no synovitis.  No warmth or effusion of knee joints.  Right knee crepitus.  No tenderness or swelling of ankle joints.    CDAI Exam: CDAI Score: Not documented Patient Global Assessment: Not documented; Provider Global Assessment: Not documented Swollen: Not documented; Tender: Not documented Joint Exam   Not documented   There is currently no information documented on the homunculus. Go to the Rheumatology activity and complete the homunculus joint exam.  Investigation: No additional findings.  Imaging: No results found.  Recent Labs: Lab Results  Component Value Date   WBC 5.9 01/01/2018   HGB 14.1 01/01/2018   PLT 210 01/01/2018   NA 141 01/24/2018   K 4.5 01/24/2018   CL 102 01/24/2018   CO2 25 01/24/2018  GLUCOSE 81 01/24/2018   BUN 17 01/24/2018   CREATININE 0.94 01/24/2018   BILITOT 0.5 07/19/2017   ALKPHOS 49 11/25/2016   AST 23 07/19/2017   ALT 17 07/19/2017   PROT 7.6 07/19/2017   ALBUMIN 4.1 11/25/2016   CALCIUM 9.8 01/24/2018   GFRAA 74 01/24/2018    Speciality Comments: No specialty comments available.  Procedures:  No procedures performed Allergies: Septra [bactrim]   Assessment / Plan:     Visit Diagnoses: Discoid lupus -  Biopsy proven.  She has been in remission without any recurrence.  She has no discoid lesions on exam.  She will continue to take Plaquenil 200 mg 1 tablet by mouth daily.  She does not need any refills at this time.  She has no symptoms of systemic lupus at this time.  Autoimmune work up was negative in July 2018.  She was encouraged to wear sunscreen on a daily basis and to reapply every 2 hours.  She should avoid the prime hours of sunlight.  She was advised to notify us if she develops a rash.     High risk medication use - Plaquenil  200 mg by mouth daily.  Her most recent eye exam: 01/03/2017.  She has a PLQ eye exam scheduled for 03/07/18. She was given a PLQ eye exam form to take with her to her next appointment. BMP  WNL on 01/24/18 and CBC WNL on 01/01/18.   Raynaud's disease without gangrene: She has no symptoms of Raynaud's at this time.  She has no digital ulcerations or signs of gangrene.    Photosensitivity: She was encouraged to wear sunscreen daily and reapply every 2 hours.   Primary osteoarthritis of both hands: She has no discomfort at this time. She has complete fist formation bilaterally.  Joint protection and muscle strengthening were discussed.   Primary osteoarthritis of both feet: She has no discomfort.  She wears proper fitting shoes.   History of hypertension  Other fatigue   Orders: No orders of the defined types were placed in this encounter.  No orders of the defined types were placed in this encounter.     Follow-Up Instructions: Return in about 6 months (around 08/07/2018) for Discoid lupus, Osteoarthritis.   Ofilia Neas, PA-C  Note - This record has been created using Dragon software.  Chart creation errors have been sought, but may not always  have been located. Such creation errors do not reflect on  the standard of medical care.

## 2018-01-23 NOTE — Progress Notes (Signed)
Cardiology Office Note:    Date:  01/24/2018   ID:  Alicia Raymond, DOB 09-04-1955, MRN [id number]  PCP:  Rita Ohara, MD  Cardiologist:  Pixie Casino, MD   Referring MD: Rita Ohara, MD   Chief Complaint  Patient presents with  . Hospitalization Follow-up    rapid heart rate    History of Present Illness:    Alicia Raymond is a 62 y.o. female with a hx of HTN, apical variant hypertrophic cardiomyopathy, RA and lupus. She has been maintained on losartan for HTN and cardiomopathy. Her connective tissue disorders have been well-managed. Recent heart monitor for palpitations was negative for arrhythmias. It was suspected that her palpitations were related to stress, last seen on 05/22/17.   She was recently seen in the ER 01/01/18 with palpitations. She was noted to be in SVT with rates in the 160s when EMS arrived. This was resolved prior to her arrival in the ED. Per notes, unclear what medications she received from EMS. Case was discussed with Dr. Debara Pickett who started toprol. She was discharged with close cardiology follow up.   She presents today for ER visit follow up. She states that she thinks the toprol is working well, but she had another episode of rapid heart rate this past weekend that lasted 45 min to 1 hour. After the event resolved, she was unable to lay flat to sleep because she could feel her heart beat pounding, but was not going fast. This lasted 6-7 hours. She has not had an episode since. We discussed vagal maneuvers, referral to EP, and her beta blocker dose. She wants to try a conservative approach first. She is very stressed at work and wonders if this is contributing. She will keep a log over the next month and present this at her next follow up. We discussed when to call the office and when to report to the ER.   Past Medical History:  Diagnosis Date  . Apical variant hypertrophic cardiomyopathy (HCC)    Dr. Debara Pickett  . Colon polyp   . Hypertension    Dr. Debara Pickett   . Lupus ([place])    DISCOID  . Rheumatoid arthritis(714.0)    Dr. Estanislado Pandy  . Systemic lupus ([place]) 11/30/09   (pt denies systemic)    Past Surgical History:  Procedure Laterality Date  . BREAST BIOPSY Right 1988   fibroadenoma (benign)  . CARDIAC CATHETERIZATION  04/12/2010   LAD with 30% narrowing - minimal CAD (Dr. Alma Friendly)  . TRANSTHORACIC ECHOCARDIOGRAM  03/2010   EF=>55%, apical hypertrophy, mod conc LVH; mild MR; trace TR with normal RVSP  . TUBAL LIGATION      Current Medications: Current Meds  Medication Sig  . aspirin 325 MG EC tablet Take 650 mg by mouth daily as needed for pain.  Marland Kitchen CALCIUM PO Take 1 tablet by mouth daily.   . Cholecalciferol (VITAMIN D PO) Take 1 capsule by mouth daily.   . hydroxychloroquine (PLAQUENIL) 200 MG tablet TAKE ONE TABLET BY MOUTH ONE TIME DAILY   . ibuprofen (ADVIL,MOTRIN) 200 MG tablet Take 400 mg by mouth every 6 (six) hours as needed for mild pain or moderate pain.  Marland Kitchen losartan (COZAAR) 50 MG tablet Take 1 tablet (50 mg total) by mouth daily. Keep OV.  . metoprolol succinate (TOPROL-XL) 25 MG 24 hr tablet Take 0.5 tablets (12.5 mg total) by mouth daily.  . Omega-3 Fatty Acids (FISH OIL) 1000 MG CAPS Take 1 capsule by mouth daily.  Allergies:   Septra [bactrim]   Social History   Socioeconomic History  . Marital status: Married    Spouse name: Not on file  . Number of children: 3  . Years of education: Not on file  . Highest education level: Not on file  Occupational History  . Occupation: Patent attorney: [employer]  Social Needs  . Financial resource strain: Not on file  . Food insecurity:    Worry: Not on file    Inability: Not on file  . Transportation needs:    Medical: Not on file    Non-medical: Not on file  Tobacco Use  . Smoking status: Never Smoker  . Smokeless tobacco: Never Used  Substance and Sexual Activity  . Alcohol use: Yes    Comment: up to 5 times/week, 1 glass of  red wine  . Drug use: No  . Sexual activity: Yes    Partners: Male  Lifestyle  . Physical activity:    Days per week: Not on file    Minutes per session: Not on file  . Stress: Not on file  Relationships  . Social connections:    Talks on phone: Not on file    Gets together: Not on file    Attends religious service: Not on file    Active member of club or organization: Not on file    Attends meetings of clubs or organizations: Not on file    Relationship status: Not on file  Other Topics Concern  . Not on file  Social History Narrative   Married.  Lives with her husband.  Oldest daughter lives in [place], son and daughter lives in [place], [place].  5 grandchildren.  (lost 1 daughter at the age of 2.5 yo due to illness)     Family History: The patient's family history includes COPD in her brother; Cancer in her father and sister; Cancer (age of onset: 20) in her sister; Hyperlipidemia in her brother and son; Hypertension in her mother; Lung cancer in her father; Other in her sister. There is no history of Diabetes, Colon cancer, or Breast cancer.  ROS:   Please see the history of present illness.     All other systems reviewed and are negative.  EKGs/Labs/Other Studies Reviewed:    The following studies were reviewed today:  Holter 04/20/17 Sinus rhythm and sinus tachycardia. No arrhythmias.  EKG:  EKG is ordered today.  The ekg ordered today demonstrates sinus rhythm with TWI V2-6, noted previously  Recent Labs: 07/19/2017: ALT 17 01/01/2018: BUN 16; Creatinine, Ser 0.67; Hemoglobin 14.1; Platelets 210; Potassium 3.7; Sodium 134  Recent Lipid Panel    Component Value Date/Time   CHOL 214 (H) 09/29/2016 0851   TRIG 68 09/29/2016 0851   HDL 102 09/29/2016 0851   CHOLHDL 2.1 09/29/2016 0851   VLDL 14 09/29/2016 0851   LDLCALC 98 09/29/2016 0851    Physical Exam:    VS:  BP 110/60   Pulse 66   Ht 5\' 2"  (1.575 m)   Wt 162 lb 12.8 oz (73.8 kg)   BMI 29.78 kg/m     Wt  Readings from Last 3 Encounters:  01/24/18 162 lb 12.8 oz (73.8 kg)  10/09/17 162 lb 3.2 oz (73.6 kg)  07/19/17 166 lb (75.3 kg)     GEN: Well nourished, well developed in no acute distress HEENT: Normal NECK: No JVD; No carotid bruits CARDIAC: RRR, no murmurs, rubs, gallops RESPIRATORY:  Clear  to auscultation without rales, wheezing or rhonchi  ABDOMEN: Soft, non-tender, non-distended MUSCULOSKELETAL:  No edema; No deformity  SKIN: Warm and dry NEUROLOGIC:  Alert and oriented x 3 PSYCHIATRIC:  Normal affect   ASSESSMENT:    1. SVT (supraventricular tachycardia) ([place])   2. Fatigue, unspecified type   3. Essential hypertension    PLAN:    In order of problems listed above:  SVT (supraventricular tachycardia) ([place]) - Plan: EKG 12-Lead, TSH, Basic Metabolic Panel (BMET), Magnesium We discussed her Toprol dose.  We also discussed vagal maneuvers.  She would like to try the conservative approach for 1 month.  She will keep a log of all events.  I would like to see her back in 1 month with Dr. Debara Pickett.  We discussed when she needs to call the office and when she should report to the ER.  If she continues to have episodes of rapid heart rate, would consider repeat Holter monitor versus EP consult.  We will check a TSH, mag, BMP today.  Fatigue, unspecified type Likely due to her suspected SVT episode.  Fatigue has since resolved.  Will check labs as above.  Essential hypertension No medication changes today, pressures well controlled at 110/60.  Follow-up in 1 month with Dr. Debara Pickett.  Medication Adjustments/Labs and Tests Ordered: Current medicines are reviewed at length with the patient today.  Concerns regarding medicines are outlined above.  Orders Placed This Encounter  Procedures  . TSH  . Basic Metabolic Panel (BMET)  . Magnesium  . EKG 12-Lead   No orders of the defined types were placed in this encounter.   Signed, Ledora Bottcher, PA  01/24/2018 4:26 PM    [facility]

## 2018-01-24 ENCOUNTER — Encounter: Payer: Self-pay | Admitting: Physician Assistant

## 2018-01-24 ENCOUNTER — Ambulatory Visit (INDEPENDENT_AMBULATORY_CARE_PROVIDER_SITE_OTHER): Payer: 59 | Admitting: Physician Assistant

## 2018-01-24 VITALS — BP 110/60 | HR 66 | Ht 62.0 in | Wt 162.8 lb

## 2018-01-24 DIAGNOSIS — I471 Supraventricular tachycardia: Secondary | ICD-10-CM | POA: Diagnosis not present

## 2018-01-24 DIAGNOSIS — I10 Essential (primary) hypertension: Secondary | ICD-10-CM

## 2018-01-24 DIAGNOSIS — R5383 Other fatigue: Secondary | ICD-10-CM | POA: Diagnosis not present

## 2018-01-24 NOTE — Patient Instructions (Signed)
Medication Instructions:  Your physician recommends that you continue on your current medications as directed. Please refer to the Current Medication list given to you today.  If you need a refill on your cardiac medications before your next appointment, please call your pharmacy.  Labwork: Your physician recommends that you return for lab work in: TODAY-BMET, TSH, [employer] in our office  Testing/Procedures: None  Follow-Up: Your physician recommends that you schedule a follow-up appointment in: 1 month with Dr Debara Pickett ONLY.  Any Other Special Instructions Will Be Listed Below (If Applicable).

## 2018-01-25 LAB — TSH: TSH: 1.27 u[IU]/mL (ref 0.450–4.500)

## 2018-01-25 LAB — BASIC METABOLIC PANEL
BUN/Creatinine Ratio: 18 (ref 12–28)
BUN: 17 mg/dL (ref 8–27)
CO2: 25 mmol/L (ref 20–29)
Calcium: 9.8 mg/dL (ref 8.7–10.3)
Chloride: 102 mmol/L (ref 96–106)
Creatinine, Ser: 0.94 mg/dL (ref 0.57–1.00)
GFR calc Af Amer: 74 mL/min/{1.73_m2} (ref >59)
GFR calc non Af Amer: 64 mL/min/{1.73_m2} (ref >59)
Glucose: 81 mg/dL (ref 65–99)
Potassium: 4.5 mmol/L (ref 3.5–5.2)
Sodium: 141 mmol/L (ref 134–144)

## 2018-01-25 LAB — MAGNESIUM: Magnesium: 2.4 mg/dL — ABNORMAL HIGH (ref 1.6–2.3)

## 2018-02-06 ENCOUNTER — Encounter: Payer: Self-pay | Admitting: Physician Assistant

## 2018-02-06 ENCOUNTER — Ambulatory Visit (INDEPENDENT_AMBULATORY_CARE_PROVIDER_SITE_OTHER): Payer: 59 | Admitting: Physician Assistant

## 2018-02-06 VITALS — BP 114/77 | HR 65 | Resp 16 | Ht 62.0 in | Wt 164.8 lb

## 2018-02-06 DIAGNOSIS — I7300 Raynaud's syndrome without gangrene: Secondary | ICD-10-CM | POA: Diagnosis not present

## 2018-02-06 DIAGNOSIS — M19072 Primary osteoarthritis, left ankle and foot: Secondary | ICD-10-CM

## 2018-02-06 DIAGNOSIS — M19042 Primary osteoarthritis, left hand: Secondary | ICD-10-CM

## 2018-02-06 DIAGNOSIS — Z79899 Other long term (current) drug therapy: Secondary | ICD-10-CM

## 2018-02-06 DIAGNOSIS — L568 Other specified acute skin changes due to ultraviolet radiation: Secondary | ICD-10-CM

## 2018-02-06 DIAGNOSIS — M19071 Primary osteoarthritis, right ankle and foot: Secondary | ICD-10-CM

## 2018-02-06 DIAGNOSIS — M19041 Primary osteoarthritis, right hand: Secondary | ICD-10-CM

## 2018-02-06 DIAGNOSIS — Z8679 Personal history of other diseases of the circulatory system: Secondary | ICD-10-CM

## 2018-02-06 DIAGNOSIS — L930 Discoid lupus erythematosus: Secondary | ICD-10-CM | POA: Diagnosis not present

## 2018-02-06 DIAGNOSIS — R5383 Other fatigue: Secondary | ICD-10-CM

## 2018-02-09 DIAGNOSIS — Z23 Encounter for immunization: Secondary | ICD-10-CM | POA: Diagnosis not present

## 2018-02-28 ENCOUNTER — Ambulatory Visit: Payer: 59 | Admitting: Internal Medicine

## 2018-03-07 ENCOUNTER — Ambulatory Visit (INDEPENDENT_AMBULATORY_CARE_PROVIDER_SITE_OTHER): Payer: 59 | Admitting: Internal Medicine

## 2018-03-07 VITALS — BP 109/75 | HR 65 | Ht 62.0 in | Wt 166.8 lb

## 2018-03-07 DIAGNOSIS — R5383 Other fatigue: Secondary | ICD-10-CM | POA: Diagnosis not present

## 2018-03-07 DIAGNOSIS — I422 Other hypertrophic cardiomyopathy: Secondary | ICD-10-CM

## 2018-03-07 DIAGNOSIS — I471 Supraventricular tachycardia: Secondary | ICD-10-CM

## 2018-03-07 DIAGNOSIS — I10 Essential (primary) hypertension: Secondary | ICD-10-CM | POA: Diagnosis not present

## 2018-03-07 MED ORDER — METOPROLOL SUCCINATE ER 25 MG PO TB24: 12.5000 mg | ORAL_TABLET | Freq: Every day | ORAL | 3 refills | Status: DC

## 2018-03-07 NOTE — Progress Notes (Signed)
OFFICE NOTE  Chief Complaint:  Follow-up studies  Primary Care Physician: Rita Ohara, MD  HPI:  Alicia Raymond is a 62 year old female who has a history of apical hypertrophic cardiomyopathy as well as mild hypertension, rheumatoid arthritis and lupus. Overall, those connective tissue disorders have been very well controlled and she has been on losartan, which is hopefully going to be helpful with providing her some prevention from apical hypertrophy. Over the past year, she has stopped exercising, and gained a small amount of weight. She has had a couple of episodes of chest discomfort at night over the past year however they have subsided.  I had the pleasure of seeing Alicia Raymond back in the office today. Overall she's done very well over the past year. She started doing significant exercise almost every morning. Her weight is down now more than 15 pounds to 153. Blood pressure is well controlled at 118/70. She remains on losartan, mostly for apical hypertrophic cardiomyopathy. She's had no chest pain or worsening shortness of breath. She reports her rheumatoid arthritis is better controlled in fact she is on a lower dose of Plaquenil.  01/08/2016  Alicia Raymond returns today for follow-up. She feels that she is doing really well. She lost another 3 pounds and denies any chest pain or worsening shortness of breath. Unfortunately her sister is quite ill in [place] and she is under a lot of stress as she's been traveling and visiting with her. Blood pressure appears excellent today. Her last echocardiogram was in 2011 and has not been reassessed since that time. EKG shows sinus rhythm with LVH and T-wave changes apically and anterolaterally consistent with her prior diagnosis of apical hypertrophic cardiomyopathy. With regards to her RA and lupus those have been well controlled on low-dose plaquenil.  04/17/2017  Alicia Raymond was seen today as an add-on for "heart pounding".  She reports  over the past week she has felt like her heart was racing or pounding.  She says it occasionally is associated with discomfort in the chest and some numbness in her left arm.  Afterward she feels fatigued.  It is not worse with exertion or relieved by rest, and she is continued to be able to do exercises on a treadmill.  She says it comes on randomly and is associated with fatigue.  She says that her husband listened to her heart and noted that it was pounding harder and faster than she remembered.  05/22/2017  Alicia Raymond returns today for follow-up of her monitor results.  This did not show any extrasystoles or arrhythmias that would be associated with her symptoms of palpitations.  She says they seem to be worse when having disagreements with her husband and I suspect they are related to stress.  She has had a little physical responsibilities over the past week and has felt much better.  03/07/2018  Alicia Raymond returns today for follow-up.  She wore a monitor in November which demonstrated no arrhythmias to explain her symptoms.  She does have a history of tachypalpitations.  Recently she was in the emergency department and an EKG was able to capture an SVT with heart rate into the 160s.  I was notified by the ER physician and recommended starting her on Toprol-XL 25 mg daily.  She reports taking the medicine although feels like she might be a little fatigue.  In general though her energy is much better and she says she has had significant improvement in her palpitations.  She took a week vacation with  her husband and and they went to the beach.  She was able to walk long distances and was a lot more active without having to stop and she was previously.  PMHx:  Past Medical History:  Diagnosis Date  . Apical variant hypertrophic cardiomyopathy (HCC)    Dr. Debara Pickett  . Colon polyp   . Hypertension    Dr. Debara Pickett  . Rheumatoid arthritis(714.0)    Dr. Estanislado Pandy  . Systemic lupus ([place]) 11/30/09   (pt denies  systemic)    Past Surgical History:  Procedure Laterality Date  . BREAST BIOPSY Right 1988   fibroadenoma (benign)  . CARDIAC CATHETERIZATION  04/12/2010   LAD with 30% narrowing - minimal CAD (Dr. Alma Friendly)  . TRANSTHORACIC ECHOCARDIOGRAM  03/2010   EF=>55%, apical hypertrophy, mod conc LVH; mild MR; trace TR with normal RVSP  . TUBAL LIGATION      FAMHx:  Family History  Problem Relation Age of Onset  . Lung cancer Father   . Cancer Father   . COPD Brother   . Hyperlipidemia Brother   . Other Sister        brain tumor (benign)  . Hypertension Mother   . Hyperlipidemia Son   . Cancer Sister        stomach cancer  . Cancer Sister 63       metastatic (in abdomen), unknown primary, died 2 mos later  . Diabetes Neg Hx   . Colon cancer Neg Hx   . Breast cancer Neg Hx     SOCHx:   reports that she has never smoked. She has never used smokeless tobacco. She reports that she drinks alcohol. She reports that she does not use drugs.  ALLERGIES:  Allergies  Allergen Reactions  . Septra [Bactrim] Swelling    Couldn't breathe, lip swelling    ROS: Pertinent items noted in HPI and remainder of comprehensive ROS otherwise negative.  HOME MEDS: Current Outpatient Medications  Medication Sig Dispense Refill  . aspirin 325 MG EC tablet Take 650 mg by mouth daily as needed for pain.    Marland Kitchen CALCIUM PO Take 1 tablet by mouth daily.     . Cholecalciferol (VITAMIN D PO) Take 1 capsule by mouth daily.     . hydroxychloroquine (PLAQUENIL) 200 MG tablet TAKE ONE TABLET BY MOUTH ONE TIME DAILY  90 tablet 0  . losartan (COZAAR) 50 MG tablet Take 1 tablet (50 mg total) by mouth daily. Keep OV. 90 tablet 0  . metoprolol succinate (TOPROL-XL) 25 MG 24 hr tablet Take 0.5 tablets (12.5 mg total) by mouth daily. 30 tablet 0  . Omega-3 Fatty Acids (FISH OIL) 1000 MG CAPS Take 1 capsule by mouth daily.     No current facility-administered medications for this visit.     LABS/IMAGING: No  results found for this or any previous visit (from the past 48 hour(s)). No results found.  VITALS: BP 109/75   Pulse 65   Ht 5\' 2"  (1.575 m)   Wt 166 lb 12.8 oz (75.7 kg)   SpO2 96%   BMI 30.51 kg/m   EXAM: Deferred  EKG: Deferred  ASSESSMENT: 1. PSVT - improved on BB 2. Apical variant hypertrophic cardiomyopathy 3. Mild hypertension - controlled 4. Rheumatoid arthritis-followed by rheumatologist  PLAN: 1.   Yannis finally was found to have an SVT, notably a narrow complex tachycardia in the ER.  The spontaneously converted.  She was placed on beta-blocker and this is improved her symptoms and  exercise tolerance.  She is tolerating it well although does have some fatigue.  Have encouraged more physical activity but since she is done well with the medication would like to continue it at its current dose.  Plan of follow-up in 3 months or sooner as necessary.  Pixie Casino, MD, [facility], [place] Director of the Advanced Lipid Disorders &  Cardiovascular Risk Reduction Clinic Attending Cardiologist  Direct Dial: [phone]  Fax: [phone]  Website:  [email or web address] 03/07/2018, 4:33 PM

## 2018-03-07 NOTE — Patient Instructions (Signed)
Medication Instructions:  Continue current medications If you need a refill on your cardiac medications before your next appointment, please call your pharmacy.   Lab work: NONE  Testing/Procedures: NONE  Follow-Up: At [facility], you and your health needs are our priority.  As part of our continuing mission to provide you with exceptional heart care, we have created designated Provider Care Teams.  These Care Teams include your primary Cardiologist (physician) and Advanced Practice Providers (APPs -  Physician Assistants and Nurse Practitioners) who all work together to provide you with the care you need, when you need it. You will need a follow up appointment in 3 months. You may see Kenneth C Hilty, MD or one of the following Advanced Practice Providers on your designated Care Team: Hao Meng, PA-C . Angela Duke, PA-C  Any Other Special Instructions Will Be Listed Below (If Applicable).    

## 2018-03-08 ENCOUNTER — Encounter: Payer: Self-pay | Admitting: Internal Medicine

## 2018-03-13 DIAGNOSIS — H5201 Hypermetropia, right eye: Secondary | ICD-10-CM | POA: Diagnosis not present

## 2018-03-13 DIAGNOSIS — Z79899 Other long term (current) drug therapy: Secondary | ICD-10-CM | POA: Diagnosis not present

## 2018-03-13 DIAGNOSIS — H52223 Regular astigmatism, bilateral: Secondary | ICD-10-CM | POA: Diagnosis not present

## 2018-04-16 ENCOUNTER — Other Ambulatory Visit: Payer: Self-pay | Admitting: Rheumatology

## 2018-04-16 ENCOUNTER — Other Ambulatory Visit: Payer: Self-pay | Admitting: Internal Medicine

## 2018-04-16 NOTE — Telephone Encounter (Signed)
Last Visit: 02/06/2018 Next Visit: 08/08/2018 Labs: 01/01/2018 CBC normal. BMP sodium 134, calcium 8.8. Eye exam: 03/13/2018  Okay to refill plaquenil?

## 2018-04-16 NOTE — Telephone Encounter (Signed)
Ok to refill 

## 2018-05-28 DIAGNOSIS — H16223 Keratoconjunctivitis sicca, not specified as Sjogren's, bilateral: Secondary | ICD-10-CM | POA: Diagnosis not present

## 2018-06-07 ENCOUNTER — Ambulatory Visit (INDEPENDENT_AMBULATORY_CARE_PROVIDER_SITE_OTHER): Payer: 59 | Admitting: Internal Medicine

## 2018-06-07 ENCOUNTER — Encounter: Payer: Self-pay | Admitting: Internal Medicine

## 2018-06-07 VITALS — BP 112/72 | HR 67 | Ht 62.5 in | Wt 167.2 lb

## 2018-06-07 DIAGNOSIS — I471 Supraventricular tachycardia: Secondary | ICD-10-CM | POA: Diagnosis not present

## 2018-06-07 DIAGNOSIS — I10 Essential (primary) hypertension: Secondary | ICD-10-CM | POA: Diagnosis not present

## 2018-06-07 DIAGNOSIS — R5383 Other fatigue: Secondary | ICD-10-CM

## 2018-06-07 NOTE — Patient Instructions (Signed)
Medication Instructions:  Continue current medications If you need a refill on your cardiac medications before your next appointment, please call your pharmacy.   Follow-Up: At [facility], you and your health needs are our priority.  As part of our continuing mission to provide you with exceptional heart care, we have created designated Provider Care Teams.  These Care Teams include your primary Cardiologist (physician) and Advanced Practice Providers (APPs -  Physician Assistants and Nurse Practitioners) who all work together to provide you with the care you need, when you need it. You will need a follow up appointment in 6 months.  Please call our office 2 months in advance to schedule this appointment.  You may see Kenneth C Hilty, MD or one of the following Advanced Practice Providers on your designated Care Team: Hao Meng, PA-C . Angela Duke, PA-C  Any Other Special Instructions Will Be Listed Below (If Applicable).    

## 2018-06-07 NOTE — Progress Notes (Signed)
OFFICE NOTE  Chief Complaint:  Routine followup  Primary Care Physician: Rita Ohara, MD  HPI:  Alicia Raymond is a 63 year old female who has a history of apical hypertrophic cardiomyopathy as well as mild hypertension, rheumatoid arthritis and lupus. Overall, those connective tissue disorders have been very well controlled and she has been on losartan, which is hopefully going to be helpful with providing her some prevention from apical hypertrophy. Over the past year, she has stopped exercising, and gained a small amount of weight. She has had a couple of episodes of chest discomfort at night over the past year however they have subsided.  I had the pleasure of seeing Alicia Raymond back in the office today. Overall she's done very well over the past year. She started doing significant exercise almost every morning. Her weight is down now more than 15 pounds to 153. Blood pressure is well controlled at 118/70. She remains on losartan, mostly for apical hypertrophic cardiomyopathy. She's had no chest pain or worsening shortness of breath. She reports her rheumatoid arthritis is better controlled in fact she is on a lower dose of Plaquenil.  01/08/2016  Alicia Raymond returns today for follow-up. She feels that she is doing really well. She lost another 3 pounds and denies any chest pain or worsening shortness of breath. Unfortunately her sister is quite ill in [place] and she is under a lot of stress as she's been traveling and visiting with her. Blood pressure appears excellent today. Her last echocardiogram was in 2011 and has not been reassessed since that time. EKG shows sinus rhythm with LVH and T-wave changes apically and anterolaterally consistent with her prior diagnosis of apical hypertrophic cardiomyopathy. With regards to her RA and lupus those have been well controlled on low-dose plaquenil.  04/17/2017  Alicia Raymond was seen today as an add-on for "heart pounding".  She reports  over the past week she has felt like her heart was racing or pounding.  She says it occasionally is associated with discomfort in the chest and some numbness in her left arm.  Afterward she feels fatigued.  It is not worse with exertion or relieved by rest, and she is continued to be able to do exercises on a treadmill.  She says it comes on randomly and is associated with fatigue.  She says that her husband listened to her heart and noted that it was pounding harder and faster than she remembered.  05/22/2017  Alicia Raymond returns today for follow-up of her monitor results.  This did not show any extrasystoles or arrhythmias that would be associated with her symptoms of palpitations.  She says they seem to be worse when having disagreements with her husband and I suspect they are related to stress.  She has had a little physical responsibilities over the past week and has felt much better.  03/07/2018  Alicia Raymond returns today for follow-up.  She wore a monitor in November which demonstrated no arrhythmias to explain her symptoms.  She does have a history of tachypalpitations.  Recently she was in the emergency department and an EKG was able to capture an SVT with heart rate into the 160s.  I was notified by the ER physician and recommended starting her on Toprol-XL 25 mg daily.  She reports taking the medicine although feels like she might be a little fatigue.  In general though her energy is much better and she says she has had significant improvement in her palpitations.  She took a week vacation with  her husband and and they went to the beach.  She was able to walk long distances and was a lot more active without having to stop and she was previously.  06/07/2018  Alicia Raymond was seen today in follow-up.  Overall she is doing well on Toprol.  She denies any recurrent SVT.  She has no concerning palpitations.  She denies chest pain or worsening shortness of breath.  She started walking more recently about 3 miles a day.   She still battling the weight somewhat however does report eating fairly healthy diet.  PMHx:  Past Medical History:  Diagnosis Date  . Apical variant hypertrophic cardiomyopathy (HCC)    Dr. Debara Pickett  . Colon polyp   . Hypertension    Dr. Debara Pickett  . Rheumatoid arthritis(714.0)    Dr. Estanislado Pandy  . Systemic lupus ([place]) 11/30/09   (pt denies systemic)    Past Surgical History:  Procedure Laterality Date  . BREAST BIOPSY Right 1988   fibroadenoma (benign)  . CARDIAC CATHETERIZATION  04/12/2010   LAD with 30% narrowing - minimal CAD (Dr. Alma Friendly)  . TRANSTHORACIC ECHOCARDIOGRAM  03/2010   EF=>55%, apical hypertrophy, mod conc LVH; mild MR; trace TR with normal RVSP  . TUBAL LIGATION      FAMHx:  Family History  Problem Relation Age of Onset  . Lung cancer Father   . Cancer Father   . COPD Brother   . Hyperlipidemia Brother   . Other Sister        brain tumor (benign)  . Hypertension Mother   . Hyperlipidemia Son   . Cancer Sister        stomach cancer  . Cancer Sister 9       metastatic (in abdomen), unknown primary, died 2 mos later  . Diabetes Neg Hx   . Colon cancer Neg Hx   . Breast cancer Neg Hx     SOCHx:   reports that she has never smoked. She has never used smokeless tobacco. She reports current alcohol use. She reports that she does not use drugs.  ALLERGIES:  Allergies  Allergen Reactions  . Septra [Bactrim] Swelling    Couldn't breathe, lip swelling    ROS: Pertinent items noted in HPI and remainder of comprehensive ROS otherwise negative.  HOME MEDS: Current Outpatient Medications  Medication Sig Dispense Refill  . aspirin 325 MG EC tablet Take 650 mg by mouth daily as needed for pain.    Marland Kitchen CALCIUM PO Take 1 tablet by mouth daily.     . Cholecalciferol (VITAMIN D PO) Take 1 capsule by mouth daily.     . hydroxychloroquine (PLAQUENIL) 200 MG tablet TAKE ONE TABLET BY MOUTH ONE TIME DAILY  90 tablet 0  . losartan (COZAAR) 50 MG tablet TAKE ONE  TABLET BY MOUTH ONE TIME DAILY  90 tablet 2  . metoprolol succinate (TOPROL-XL) 25 MG 24 hr tablet Take 0.5 tablets (12.5 mg total) by mouth daily. 45 tablet 3  . Omega-3 Fatty Acids (FISH OIL) 1000 MG CAPS Take 1 capsule by mouth daily.     No current facility-administered medications for this visit.     LABS/IMAGING: No results found for this or any previous visit (from the past 48 hour(s)). No results found.  VITALS: BP 112/72   Pulse 67   Ht 5' 2.5" (1.588 m)   Wt 167 lb 3.2 oz (75.8 kg)   BMI 30.09 kg/m   EXAM: General appearance: alert and no distress Neck: no  carotid bruit, no JVD and thyroid not enlarged, symmetric, no tenderness/mass/nodules Lungs: clear to auscultation bilaterally Heart: regular rate and rhythm, S1, S2 normal, no murmur, click, rub or gallop Abdomen: soft, non-tender; bowel sounds normal; no masses,  no organomegaly Extremities: extremities normal, atraumatic, no cyanosis or edema Pulses: 2+ and symmetric Skin: Skin color, texture, turgor normal. No rashes or lesions Neurologic: Grossly normal : Pleasant  EKG: Normal sinus rhythm, LVH at 67-personally reviewed  ASSESSMENT: 1. PSVT - improved on BB 2. Apical variant hypertrophic cardiomyopathy 3. Mild hypertension - controlled 4. Rheumatoid arthritis-followed by rheumatologist  PLAN: 1.   Alicia Raymond has had significant improvement in her palpitations on beta-blocker.  She may be considering some knee surgery.  Overall she is doing well would be at low risk for that.  She has apical variant HCM and will monitor this by echo.  Continue current medications.  Blood pressure is at goal.  Follow-up with me in 6 months or sooner as necessary.  Pixie Casino, MD, [facility], [place] Director of the Advanced Lipid Disorders &  Cardiovascular Risk Reduction Clinic Attending Cardiologist  Direct Dial: [phone]  Fax: [phone]  Website:  [email or web address]  Ashawna Hanback 06/07/2018, 4:23 PM

## 2018-06-13 NOTE — Addendum Note (Signed)
Addended by: Jacqulynn Cadet on: 06/13/2018 07:57 AM   Modules accepted: Orders

## 2018-07-19 ENCOUNTER — Other Ambulatory Visit: Payer: Self-pay | Admitting: Physician Assistant

## 2018-07-19 NOTE — Telephone Encounter (Signed)
Last Visit: 02/06/2018 Next Visit: 08/08/2018 Labs: 01/01/2018 CBC normal. BMP sodium 134, calcium 8.8. Eye exam: 03/13/2018  Okay to refill 30 day supply per Dr. Estanislado Pandy

## 2018-07-25 NOTE — Progress Notes (Deleted)
Office Visit Note  Patient: Alicia Raymond             Date of Birth: [birth year]           MRN: [id number]             PCP: Rita Ohara, MD Referring: Rita Ohara, MD Visit Date: 08/08/2018 Occupation: @GUAROCC @  Subjective:  No chief complaint on file.  Plaquenil 200 mg daily.  Last Plaquenil eye exam normal on 03/13/2018 and will monitor yearly.  Most recent CBC within normal limits on 01/01/2018.  Most recent BMP within normal limits on 01/24/2018.  AST/ALT within normal limits with last CMP on 07/19/2017.  Due for CBC/CMP today and will monitor every 5 months.  She had her flu shot in September and previously received Pneumovax 23, zoster, and Shingrix.  Recommend Prevnar 13 as indicated.  History of Present Illness: Alicia Raymond is a 63 y.o. female ***   Activities of Daily Living:  Patient reports morning stiffness for *** {minute/hour:19697}.   Patient {ACTIONS;DENIES/REPORTS:21021675::"Denies"} nocturnal pain.  Difficulty dressing/grooming: {ACTIONS;DENIES/REPORTS:21021675::"Denies"} Difficulty climbing stairs: {ACTIONS;DENIES/REPORTS:21021675::"Denies"} Difficulty getting out of chair: {ACTIONS;DENIES/REPORTS:21021675::"Denies"} Difficulty using hands for taps, buttons, cutlery, and/or writing: {ACTIONS;DENIES/REPORTS:21021675::"Denies"}  No Rheumatology ROS completed.   PMFS History:  Patient Active Problem List   Diagnosis Date Noted  . Lupus ([place])   . Hypertension   . Colon polyp   . Heart palpitations 04/17/2017  . Photosensitivity 11/21/2016  . Raynaud's disease without gangrene 11/21/2016  . High risk medication use 11/21/2016  . 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 Primary osteoarthritis of both hands 03/25/2016  . Arrhythmia 03/25/2016  . Atypical chest pain 11/21/2013  . Apical variant hypertrophic cardiomyopathy ([place]) 11/09/2012  . Essential hypertension 11/09/2012  . Systemic lupus ([place]) 11/30/2009    Past Medical History:  Diagnosis Date  . Apical  variant hypertrophic cardiomyopathy (HCC)    Dr. Debara Pickett  . Colon polyp   . Hypertension    Dr. Debara Pickett  . Rheumatoid arthritis(714.0)    Dr. Estanislado Pandy  . Systemic lupus ([place]) 11/30/09   (pt denies systemic)    Family History  Problem Relation Age of Onset  . Lung cancer Father   . Cancer Father   . COPD Brother   . Hyperlipidemia Brother   . Other Sister        brain tumor (benign)  . Hypertension Mother   . Hyperlipidemia Son   . Cancer Sister        stomach cancer  . Cancer Sister 68       metastatic (in abdomen), unknown primary, died 2 mos later  . Diabetes Neg Hx   . Colon cancer Neg Hx   . Breast cancer Neg Hx    Past Surgical History:  Procedure Laterality Date  . BREAST BIOPSY Right 1988   fibroadenoma (benign)  . CARDIAC CATHETERIZATION  04/12/2010   LAD with 30% narrowing - minimal CAD (Dr. Alma Friendly)  . TRANSTHORACIC ECHOCARDIOGRAM  03/2010   EF=>55%, apical hypertrophy, mod conc LVH; mild MR; trace TR with normal RVSP  . TUBAL LIGATION     Social History   Social History Narrative   Married.  Lives with her husband.  Oldest daughter lives in [place], son and daughter lives in [place], [place].  5 grandchildren.  (lost 1 daughter at the age of 2.5 yo due to illness)   Immunization History  Administered Date(s) Administered  . Influenza Split 04/03/2012, 02/08/2013, 02/12/2014  . Influenza-Unspecified 02/13/2015, 02/18/2016, 02/16/2017  .  Pneumococcal Polysaccharide-23 02/08/2013  . Tdap 02/12/2014  . Zoster 08/26/2014  . Zoster Recombinat (Shingrix) 02/16/2017, 06/23/2017     Objective: Vital Signs: There were no vitals taken for this visit.   Physical Exam   Musculoskeletal Exam: ***  CDAI Exam: CDAI Score: Not documented Patient Global Assessment: Not documented; Provider Global Assessment: Not documented Swollen: Not documented; Tender: Not documented Joint Exam   Not documented   There is currently no information documented on the homunculus.  Go to the Rheumatology activity and complete the homunculus joint exam.  Investigation: No additional findings.  Imaging: No results found.  Recent Labs: Lab Results  Component Value Date   WBC 5.9 01/01/2018   HGB 14.1 01/01/2018   PLT 210 01/01/2018   NA 141 01/24/2018   K 4.5 01/24/2018   CL 102 01/24/2018   CO2 25 01/24/2018   GLUCOSE 81 01/24/2018   BUN 17 01/24/2018   CREATININE 0.94 01/24/2018   BILITOT 0.5 07/19/2017   ALKPHOS 49 11/25/2016   AST 23 07/19/2017   ALT 17 07/19/2017   PROT 7.6 07/19/2017   ALBUMIN 4.1 11/25/2016   CALCIUM 9.8 01/24/2018   GFRAA 74 01/24/2018    Speciality Comments: PLQ Eye Exam: 03/13/18 WNL @ [facility] follow up in 1 year  Procedures:  No procedures performed Allergies: Septra [bactrim]   Assessment / Plan:     Visit Diagnoses: Discoid lupus - Biopsy proven.  She has been in remission without any recurrence  High risk medication use - PLQ 200 mg 1 tablet po daily  Raynaud's disease without gangrene  Photosensitivity  Primary osteoarthritis of both hands  Primary osteoarthritis of both feet  History of hypertension  Other fatigue   Orders: No orders of the defined types were placed in this encounter.  No orders of the defined types were placed in this encounter.   Face-to-face time spent with patient was *** minutes. Greater than 50% of time was spent in counseling and coordination of care.  Follow-Up Instructions: No follow-ups on file.   Ofilia Neas, PA-C  Note - This record has been created using Dragon software.  Chart creation errors have been sought, but may not always  have been located. Such creation errors do not reflect on  the standard of medical care.

## 2018-08-08 ENCOUNTER — Ambulatory Visit: Payer: 59 | Admitting: Physician Assistant

## 2018-08-08 ENCOUNTER — Telehealth: Payer: Self-pay | Admitting: Rheumatology

## 2018-08-08 MED ORDER — HYDROXYCHLOROQUINE SULFATE 200 MG PO TABS: 200.0000 mg | ORAL_TABLET | Freq: Every day | ORAL | 0 refills | Status: DC

## 2018-08-08 NOTE — Telephone Encounter (Signed)
Last Visit:02/06/2018 Next Visit: due March 2020 Labs:01/01/2018 CBC normal. BMP sodium 134, calcium 8.8. Eye exam:03/13/2018  Okay to refill  per Dr. Estanislado Pandy

## 2018-08-08 NOTE — Telephone Encounter (Signed)
Please schedule patient a follow up visit. Patient due March 2020. Thanks! 

## 2018-08-08 NOTE — Telephone Encounter (Signed)
Patient called requesting prescription refill of Plaquenil to be sent to [place] at [facility].  Patient states she needs a 90 day supply because it is less expensive than the 30 day supply that was sent in last month.  Patient states she had her Plaquenil eye exam with Dr. [place] at [facility] in either October or November (she couldn't remember which month).

## 2018-09-26 ENCOUNTER — Ambulatory Visit: Payer: 59 | Admitting: Physician Assistant

## 2018-10-17 ENCOUNTER — Encounter: Payer: Self-pay | Admitting: Family Medicine

## 2018-10-22 NOTE — Progress Notes (Signed)
Office Visit Note  Patient: Alicia Raymond             Date of Birth: [birth year]           MRN: [id number]             PCP: Alicia Ohara, MD Referring: Alicia Ohara, MD Visit Date: 11/05/2018 Occupation: @GUAROCC @  Subjective:  Medication monitoring    History of Present Illness: Alicia Raymond is a 63 y.o. female with history of discoid lupus and osteoarthritis.  She is taking Plaquenil 200 mg 1 tablet by mouth daily.  She denies any recent rashes or discoid lesion.  She denies any hair loss.  She has not had any sores in her mouth or nose.  She denies any sicca symptoms.  She has not had any symptoms of Raynaud's recently.  She continues to have pain and stiffness in bilateral hands but denies any joint swelling.  She is been having some trapezius muscle tension and muscle tenderness.  She states massage helps relieve muscle tension. She denies any other joint pain or joint swelling. She states her level of fatigue is stable.    Activities of Daily Living:  Patient reports morning stiffness for 0 minute.   Patient Reports nocturnal pain.  Difficulty dressing/grooming: Denies Difficulty climbing stairs: Denies Difficulty getting out of chair: Denies Difficulty using hands for taps, buttons, cutlery, and/or writing: Denies  Review of Systems  Constitutional: Positive for fatigue.  HENT: Negative for mouth sores, mouth dryness and nose dryness.   Eyes: Negative for pain, visual disturbance and dryness.  Respiratory: Negative for cough, hemoptysis, shortness of breath, wheezing and difficulty breathing.   Cardiovascular: Negative for chest pain, palpitations, hypertension and swelling in legs/feet.  Gastrointestinal: Negative for blood in stool, constipation and diarrhea.  Endocrine: Negative for excessive thirst and increased urination.  Genitourinary: Negative for difficulty urinating and painful urination.  Musculoskeletal: Positive for arthralgias, joint pain and joint  swelling. Negative for myalgias, muscle weakness, morning stiffness, muscle tenderness and myalgias.  Skin: Negative for color change, pallor, rash, hair loss, nodules/bumps, redness, skin tightness, ulcers and sensitivity to sunlight.  Allergic/Immunologic: Negative for susceptible to infections.  Neurological: Negative for dizziness, numbness, headaches and weakness.  Hematological: Negative for swollen glands.  Psychiatric/Behavioral: Positive for sleep disturbance. Negative for depressed mood. The patient is not nervous/anxious.     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 Debara Raymond   Colon polyp    Hypertension    Dr. Debara Raymond   Rheumatoid arthritis(714.0)    Dr. Estanislado Pandy   Systemic lupus ([place]) 11/30/09   (pt denies systemic)    Family History  Problem Relation Age of Onset   Lung cancer Father    Cancer Father    COPD Brother    Hyperlipidemia Brother    Other Sister        brain tumor (benign)  Hypertension Mother    Hyperlipidemia Son    Cancer Sister        stomach cancer   Cancer Sister 11       metastatic (in abdomen), unknown primary, died 2 mos later   Diabetes Neg Hx    Colon cancer Neg Hx    Breast cancer Neg Hx    Past Surgical History:  Procedure Laterality Date   BREAST BIOPSY Right 1988   fibroadenoma (benign)   CARDIAC CATHETERIZATION   04/12/2010   LAD with 30% narrowing - minimal CAD (Dr. Alma Friendly)   TRANSTHORACIC ECHOCARDIOGRAM  03/2010   EF=>55%, apical hypertrophy, mod conc LVH; mild MR; trace TR with normal RVSP   TUBAL LIGATION     Social History   Social History Narrative   Married.  Lives with her husband.  Oldest daughter lives in [place], son and daughter lives in [place], [place].  5 grandchildren.  (lost 1 daughter at the age of 2.5 yo due to illness)   Immunization History  Administered Date(s) Administered   Influenza Inj Mdck Quad Pf 02/09/2018   Influenza Split 04/03/2012, 02/08/2013, 02/12/2014   Influenza,inj,Quad PF,6+ Mos 02/16/2017   Influenza-Unspecified 02/13/2015, 02/18/2016, 02/16/2017   Pneumococcal Polysaccharide-23 02/08/2013   Tdap 02/12/2014   Zoster 08/26/2014   Zoster Recombinat (Shingrix) 02/16/2017, 06/23/2017     Objective: Vital Signs: BP 116/75 (BP Location: Right Arm, Patient Position: Sitting, Cuff Size: Normal)    Pulse 63    Resp 12    Ht 5' 2.5" (1.588 m)    Wt 166 lb (75.3 kg)    BMI 29.88 kg/m    Physical Exam Vitals signs and nursing note reviewed.  Constitutional:      Appearance: She is well-developed.  HENT:     Head: Normocephalic and atraumatic.  Eyes:     Conjunctiva/sclera: Conjunctivae normal.  Neck:     Musculoskeletal: Normal range of motion.  Cardiovascular:     Rate and Rhythm: Normal rate and regular rhythm.     Heart sounds: Normal heart sounds.  Pulmonary:     Effort: Pulmonary effort is normal.     Breath sounds: Normal breath sounds.  Abdominal:     General: Bowel sounds are normal.     Palpations: Abdomen is soft.  Lymphadenopathy:     Cervical: No cervical adenopathy.  Skin:    General: Skin is warm and dry.     Capillary Refill: Capillary refill takes less than 2 seconds.  Neurological:     Mental Status: She is alert and oriented to person, place, and time.  Psychiatric:        Behavior: Behavior normal.       Musculoskeletal Exam: C-spine, thoracic spine, and lumbar spine good ROM.  No midline spinal tenderness.  No SI joint tenderness.  Shoulder joints, elbow joints, wrist joints, MCPs, PIPs, and DIPs good ROM with no synovitis. PIP and DIP synovial thickening consistent with osteoarthritis.  Hip joints, knee joints, ankle joints, MTPs, PIPs, and DIPs good ROM with no synovitis.  No warmth or effusion of knee joints.  No tenderness or swelling of ankle joints.  No tenderness over trochanteric bursa bilaterally.   CDAI Exam: CDAI Score: -- Patient Global: --; Provider Global: -- Swollen: --; Tender: -- Joint Exam   No joint exam has been documented for this visit   There is currently no information documented on the homunculus. Go to the Rheumatology activity and complete the homunculus joint exam.  Investigation:  No additional findings.  Imaging: No results found.  Recent Labs: Lab Results  Component Value Date   WBC 5.9 01/01/2018   HGB 14.1 01/01/2018   PLT 210 01/01/2018   NA 141 01/24/2018   K 4.5 01/24/2018   CL 102 01/24/2018   CO2 25 01/24/2018   GLUCOSE 81 01/24/2018   BUN 17 01/24/2018   CREATININE 0.94 01/24/2018   BILITOT 0.5 07/19/2017   ALKPHOS 49 11/25/2016   AST 23 07/19/2017   ALT 17 07/19/2017   PROT 7.6 07/19/2017   ALBUMIN 4.1 11/25/2016   CALCIUM 9.8 01/24/2018   GFRAA 74 01/24/2018    Speciality Comments: PLQ Eye Exam: 03/13/18 WNL @ [facility] follow up in 1 year  Procedures:  No procedures performed Allergies: Septra [bactrim]   Assessment / Plan:     Visit Diagnoses: Discoid lupus - Biopsy proven: No discoid lesions present.  She has not had any recent rashes.  No signs of alopecia noted.  She is clinically doing well on Plaquenil 200 mg 1 tablet by mouth daily.  She has not missed any doses recently.  She was encouraged to wear sunscreen and sun protective clothing.  She will notify us if she develops any new or worsening symptoms. A refill  of PLQ was sent to the pharmacy. She will follow up in 5 months.  High risk medication use - Plaquenil 200 mg by mouth daily. eye exam: 03/13/2018.  Most recent BMP within normal limits on 01/24/2018.  Most recent CBC within normal limits on 01/01/2018.  Last CMP showed normal liver enzymes on 07/19/2017.  Due for CBC/CMP today and will monitor every 5 months. - Plan: CBC with Differential/Platelet, COMPLETE METABOLIC PANEL WITH GFR  Raynaud's disease without gangrene -She has not had any symptoms of Raynaud's recently.  No digital ulcerations or signs of gangrene were noted.  Photosensitivity - She was encouraged to wear sunscreen and sun protective clothing.  Primary osteoarthritis of both hands - She has PIP and DIP synovial thickening.  No synovitis or tenderness noted.  She has complete fist formation bilaterally.    Primary osteoarthritis of both feet - She has no feet pain or joint swelling at this time.  She wears proper fitting shoes.  She has no discomfort when walking for exercise.   Other fatigue - Stable.   History of hypertension - Her blood pressure is well controlled in the office today-116/75.   Orders: Orders Placed This Encounter  Procedures   CBC with Differential/Platelet   COMPLETE METABOLIC PANEL WITH GFR   Meds ordered this encounter  Medications   hydroxychloroquine (PLAQUENIL) 200 MG tablet    Sig: Take 1 tablet (200 mg total) by mouth daily.    Dispense:  90 tablet    Refill:  0     Follow-Up Instructions: Return in 5 months (on 04/07/2019) for Discoid lupus , Osteoarthritis.   Ofilia Neas, PA-C  Note - This record has been created using Dragon software.  Chart creation errors have been sought, but may not always  have been located. Such creation errors do not reflect on  the standard of medical care.

## 2018-11-03 ENCOUNTER — Other Ambulatory Visit: Payer: Self-pay | Admitting: Rheumatology

## 2018-11-05 ENCOUNTER — Encounter: Payer: Self-pay | Admitting: Physician Assistant

## 2018-11-05 ENCOUNTER — Other Ambulatory Visit: Payer: Self-pay

## 2018-11-05 ENCOUNTER — Ambulatory Visit (INDEPENDENT_AMBULATORY_CARE_PROVIDER_SITE_OTHER): Payer: 59 | Admitting: Physician Assistant

## 2018-11-05 VITALS — BP 116/75 | HR 63 | Resp 12 | Ht 62.5 in | Wt 166.0 lb

## 2018-11-05 DIAGNOSIS — I7300 Raynaud's syndrome without gangrene: Secondary | ICD-10-CM | POA: Diagnosis not present

## 2018-11-05 DIAGNOSIS — L568 Other specified acute skin changes due to ultraviolet radiation: Secondary | ICD-10-CM

## 2018-11-05 DIAGNOSIS — L930 Discoid lupus erythematosus: Secondary | ICD-10-CM | POA: Diagnosis not present

## 2018-11-05 DIAGNOSIS — Z8679 Personal history of other diseases of the circulatory system: Secondary | ICD-10-CM

## 2018-11-05 DIAGNOSIS — M19071 Primary osteoarthritis, right ankle and foot: Secondary | ICD-10-CM

## 2018-11-05 DIAGNOSIS — M19041 Primary osteoarthritis, right hand: Secondary | ICD-10-CM

## 2018-11-05 DIAGNOSIS — M19072 Primary osteoarthritis, left ankle and foot: Secondary | ICD-10-CM

## 2018-11-05 DIAGNOSIS — M19042 Primary osteoarthritis, left hand: Secondary | ICD-10-CM

## 2018-11-05 DIAGNOSIS — Z79899 Other long term (current) drug therapy: Secondary | ICD-10-CM

## 2018-11-05 DIAGNOSIS — R5383 Other fatigue: Secondary | ICD-10-CM

## 2018-11-05 MED ORDER — HYDROXYCHLOROQUINE SULFATE 200 MG PO TABS: 200.0000 mg | ORAL_TABLET | Freq: Every day | ORAL | 0 refills | Status: DC

## 2018-11-06 LAB — CBC WITH DIFFERENTIAL/PLATELET
Absolute Monocytes: 504 cells/uL (ref 200–950)
Basophils Absolute: 58 cells/uL (ref 0–200)
Basophils Relative: 1.1 %
Eosinophils Absolute: 254 cells/uL (ref 15–500)
Eosinophils Relative: 4.8 %
HCT: 41.5 % (ref 35.0–45.0)
Hemoglobin: 13.7 g/dL (ref 11.7–15.5)
Lymphs Abs: 1919 cells/uL (ref 850–3900)
MCH: 30.1 pg (ref 27.0–33.0)
MCHC: 33 g/dL (ref 32.0–36.0)
MCV: 91.2 fL (ref 80.0–100.0)
MPV: 11.6 fL (ref 7.5–12.5)
Monocytes Relative: 9.5 %
Neutro Abs: 2565 cells/uL (ref 1500–7800)
Neutrophils Relative %: 48.4 %
Platelets: 191 10*3/uL (ref 140–400)
RBC: 4.55 10*6/uL (ref 3.80–5.10)
RDW: 13.4 % (ref 11.0–15.0)
Total Lymphocyte: 36.2 %
WBC: 5.3 10*3/uL (ref 3.8–10.8)

## 2018-11-06 LAB — COMPLETE METABOLIC PANEL WITHOUT GFR
AG Ratio: 1.5 (calc) (ref 1.0–2.5)
ALT: 21 U/L (ref 6–29)
AST: 29 U/L (ref 10–35)
Albumin: 4.1 g/dL (ref 3.6–5.1)
Alkaline phosphatase (APISO): 46 U/L (ref 37–153)
BUN: 14 mg/dL (ref 7–25)
CO2: 27 mmol/L (ref 20–32)
Calcium: 9.3 mg/dL (ref 8.6–10.4)
Chloride: 104 mmol/L (ref 98–110)
Creat: 0.85 mg/dL (ref 0.50–0.99)
GFR, Est African American: 83 mL/min/1.73m2
GFR, Est Non African American: 72 mL/min/1.73m2
Globulin: 2.7 g/dL (ref 1.9–3.7)
Glucose, Bld: 95 mg/dL (ref 65–99)
Potassium: 4.7 mmol/L (ref 3.5–5.3)
Sodium: 139 mmol/L (ref 135–146)
Total Bilirubin: 0.6 mg/dL (ref 0.2–1.2)
Total Protein: 6.8 g/dL (ref 6.1–8.1)

## 2018-11-06 NOTE — Progress Notes (Signed)
CBC and CMP WNL

## 2018-12-10 NOTE — Patient Instructions (Addendum)
HEALTH MAINTENANCE RECOMMENDATIONS:  It is recommended that you get at least 30 minutes of aerobic exercise at least 5 days/week (for weight loss, you may need as much as 60-90 minutes). This can be any activity that gets your heart rate up. This can be divided in 10-15 minute intervals if needed, but try and build up your endurance at least once a week.  Weight bearing exercise is also recommended twice weekly.  Eat a healthy diet with lots of vegetables, fruits and fiber.  "Colorful" foods have a lot of vitamins (ie green vegetables, tomatoes, red peppers, etc).  Limit sweet tea, regular sodas and alcoholic beverages, all of which has a lot of calories and sugar.  Up to 1 alcoholic drink daily may be beneficial for women (unless trying to lose weight, watch sugars).  Drink a lot of water.  Calcium recommendations are 1200-1500 mg daily (1500 mg for postmenopausal women or women without ovaries), and vitamin D 1000 IU daily.  This should be obtained from diet and/or supplements (vitamins), and calcium should not be taken all at once, but in divided doses.  Monthly self breast exams and yearly mammograms for women over the age of 73 is recommended.  Sunscreen of at least SPF 30 should be used on all sun-exposed parts of the skin when outside between the hours of 10 am and 4 pm (not just when at beach or pool, but even with exercise, golf, tennis, and yard work!)  Use a sunscreen that says "broad spectrum" so it covers both UVA and UVB rays, and make sure to reapply every 1-2 hours.  Remember to change the batteries in your smoke detectors when changing your clock times in the spring and fall. Carbon monoxide detectors are recommended for your home.  Use your seat belt every time you are in a car, and please drive safely and not be distracted with cell phones and texting while driving.  Colonoscopy is due in December 2020.  Contact your GI if you don't receive notification.  We should repeat bone  density test next year.  Corn is a starchy vegetable--consider cutting back either on the portion or the frequency.  See more information below.   MyPlate from [employer] is an outline of a general healthy diet based on the 2010 Dietary Guidelines for Americans, from the U.S. Department of Agriculture Scientist, research (physical sciences)). It sets guidelines for how much food you should eat from each food group based on your age, sex, and level of physical activity. What are tips for following MyPlate? To follow MyPlate recommendations:  Eat a wide variety of fruits and vegetables, grains, and protein foods.  Serve smaller portions and eat less food throughout the day.  Limit portion sizes to avoid overeating.  Enjoy your food.  Get at least 150 minutes of exercise every week. This is about 30 minutes each day, 5 or more days per week. It can be difficult to have every meal look like MyPlate. Think about MyPlate as eating guidelines for an entire day, rather than each individual meal. Fruits and vegetables  Make half of your plate fruits and vegetables.  Eat many different colors of fruits and vegetables each day.  For a 2,000 calorie daily food plan, eat: ? 2 cups of vegetables every day. ? 2 cups of fruit every day.  1 cup is equal to: ? 1 cup raw or cooked vegetables. ? 1 cup raw fruit. ? 1 medium-sized orange, apple, or banana. ? 1 cup 100% fruit  or vegetable juice. ? 2 cups raw leafy greens, such as lettuce, spinach, or kale. ?  cup dried fruit. Grains  One fourth of your plate should be grains.  Make at least half of the grains you eat each day whole grains.  For a 2,000 calorie daily food plan, eat 6 oz of grains every day.  1 oz is equal to: ? 1 slice bread. ? 1 cup cereal. ?  cup cooked rice, cereal, or pasta. Protein  One fourth of your plate should be protein.  Eat a wide variety of protein foods, including meat, poultry, fish, eggs, beans, nuts, and tofu.  For a 2,000  calorie daily food plan, eat 5 oz of protein every day.  1 oz is equal to: ? 1 oz meat, poultry, or fish. ?  cup cooked beans. ? 1 egg. ?  oz nuts or seeds. ? 1 Tbsp peanut butter. Dairy  Drink fat-free or low-fat (1%) milk.  Eat or drink dairy as a side to meals.  For a 2,000 calorie daily food plan, eat or drink 3 cups of dairy every day.  1 cup is equal to: ? 1 cup milk, yogurt, cottage cheese, or soy milk (soy beverage). ? 2 oz processed cheese. ? 1 oz natural cheese. Fats, oils, salt, and sugars  Only small amounts of oils are recommended.  Avoid foods that are high in calories and low in nutritional value (empty calories), like foods high in fat or added sugars.  Choose foods that are low in salt (sodium). Choose foods that have less than 140 milligrams (mg) of sodium per serving.  Drink water instead of sugary drinks. Drink enough water each day to keep your urine pale yellow. Where to find support  Work with your health care provider or a nutrition specialist (dietitian) to develop a customized eating plan that is right for you.  Download an app (mobile application) to help you track your daily food intake. Where to find more information  Go to [email or web address] for more information.  Learn more and log your daily food intake according to MyPlate using USDA's SuperTracker: www.supertracker.usda.gov Summary  MyPlate is a general guideline for healthy eating from the USDA. It is based on the 2010 Dietary Guidelines for Americans.  In general, fruits and vegetables should take up  of your plate, grains should take up  of your plate, and protein should take up  of your plate. This information is not intended to replace advice given to you by your health care provider. Make sure you discuss any questions you have with your health care provider. Document Released: 05/29/2007 Document Revised: 06/10/2017 Document Reviewed: 08/08/2016 Elsevier Patient Education   [place].    Supraventricular Tachycardia, Adult Supraventricular tachycardia (SVT) is a kind of abnormal heartbeat. It makes your heart beat very fast and then beat at a normal speed. A normal resting heartbeat is 60-100 times a minute. This condition can make your heart beat more than 150 times a minute. Times of having a fast heartbeat (episodes) can be scary, but they are usually not dangerous. In some cases, they may lead to heart failure if:  They happen many times per day.  Last longer than a few seconds. What are the causes?  A normal heartbeat starts when an area called the sinoatrial node sends out an electrical signal. In SVT, other areas of the heart send out signals that get in the way of the signal from the sinoatrial node. What increases the  risk? You are more likely to develop this condition if you are:  33-100 years old.  A woman. The following factors may make you more likely to develop this condition:  Stress.  Tiredness.  Smoking.  Stimulant drugs, such as cocaine and methamphetamine.  Alcohol.  Caffeine.  Pregnancy.  Feeling worried or nervous (anxiety). What are the signs or symptoms?  A pounding heart.  A feeling that your heart is skipping beats (palpitations).  Weakness.  Trouble getting enough air.  Pain or tightness in your chest.  Feeling like you are going to pass out (faint).  Feeling worried or nervous.  Dizziness.  Sweating.  Feeling sick to your stomach (nausea).  Passing out.  Tiredness. Sometimes, there are no symptoms. How is this treated?  Vagal nerve stimulation. Ways to do this include: ? Holding your breath and pushing, as though you are pooping (having a bowel movement). ? Massaging an area on one side of your neck. Do not try this yourself. Only a doctor should do this. If done the wrong way, it can lead to a stroke. ? Bending forward with your head between your legs. ? Coughing while bending  forward with your head between your legs. ? Closing your eyes and massaging your eyeballs. Ask a doctor how to do this.  Medicines that prevent attacks.  Medicine to stop an attack given through an IV tube at the hospital.  A small electric shock (cardioversion) that stops an attack.  Radiofrequency ablation. In this procedure, a small, thin tube (catheter) is used to send energy to the area that is causing the rapid heartbeats. If you do not have symptoms, you may not need treatment. Follow these instructions at home: Stress  Avoid things that make you feel stressed.  To deal with stress, try: ? Doing yoga or meditation, or being out in nature. ? Listening to relaxing music. ? Doing deep breathing. ? Taking steps to be healthy, such as getting lots of sleep, exercising, and eating a balanced diet. ? Talking with a mental health doctor. Lifestyle   Try to get at least 7 hours of sleep each night.  Do not use any products that contain nicotine or tobacco, such as cigarettes, e-cigarettes, and chewing tobacco. If you need help quitting, ask your doctor.  Be aware of how alcohol affects you. ? If alcohol gives you a fast heartbeat, do not drink alcohol. ? If alcohol does not seem to give you a fast heartbeat, limit alcohol use to no more than 1 drink a day for women who are not pregnant, and 2 drinks a day for men. In the [place] one drink is one of these: ? 12 oz of beer (355 mL). ? 5 oz of wine (148 mL). ? 1 oz of hard liquor (44 mL).  Be aware of how caffeine affects you. ? If caffeine gives you a fast heartbeat, do not eat, drink, or use anything with caffeine in it. ? If caffeine does not seem to give you a fast heartbeat, limit how much caffeine you eat, drink, or use.  Do not use stimulant drugs. If you need help quitting, ask your doctor. General instructions  Stay at a healthy weight.  Exercise regularly. Ask your doctor about good activities for you. Try one or a  mixture of these: ? 150 minutes a week of gentle exercise, like walking or yoga. ? 75 minutes a week of exercise that is very active, like running or swimming.  Do vagus nerve treatments  to slow down your heartbeat as told by your doctor.  Take over-the-counter and prescription medicines only as told by your doctor.  Keep all follow-up visits as told by your doctor. This is important. Contact a doctor if:  You have a fast heartbeat more often.  Times of having a fast heartbeat last longer than before.  Home treatments to slow down your heartbeat do not help.  You have new symptoms. Get help right away if:  You have chest pain.  Your symptoms get worse.  You have trouble breathing.  Your heart beats very fast for more than 20 minutes.  You pass out. These symptoms may be an emergency. Do not wait to see if the symptoms will go away. Get medical help right away. Call your local emergency services (911 in the [place] Do not drive yourself to the hospital. Summary  SVT is a type of abnormal heart beat.  This condition can make your heart beat more than 150 times a minute.  Treatment depends on how often the condition happens and your symptoms. This information is not intended to replace advice given to you by your health care provider. Make sure you discuss any questions you have with your health care provider. Document Released: 05/09/2005 Document Revised: 03/27/2018 Document Reviewed: 03/27/2018 Elsevier Patient Education  2020 [employer].

## 2018-12-10 NOTE — Progress Notes (Signed)
Chief Complaint  Patient presents with  . Annual Exam    fasting annual exam with pelvic. Sees eye doctor for eye exams. No concerns.     Alicia Raymond is a 63 y.o. female who presents for a complete physical.  She has the following concerns:  She continues to have trouble with orgasms, takes a long time--unchanged from prior.  Libido is normal.  Has vaginal dryness, uses lubricant, which helps.   She is under the care of Dr. Debara Pickett for apical variant hypertrophic cardiomyopathy, mild hypertension and SVT. Toprol XL (12.5mg ) was added after an episode of SVT (ER visit 12/2017).  She last saw Dr. Debara Pickett in 05/2018. She continues on Toprol XL 12.5mg  and losartan to help prevent any worsening of cardiomyopathy. BP has been well controlled. She had one episode of SVT since, while sitting in the car. She did the valsalva and it stopped it.  Otherwise only occasional palpitations. Denies any chest pain, shortness of breath or side effects to medications. Denies lightheadedness,headaches, dizziness, syncope, edema.  Arthritis and discoid lupus: She sees Dr. Estanislado Pandy, for OA and discoid lupus.  She is doing well on Plaquenil. She last saw rheumatologist in June. Skin is well controlled--discoid lupus becomes symptomatic if she is in the sun, so she exercises indoors. Denies any rashes.  Her knees are doing better, now able to walk for an hour on the treadmill.   Immunization History  Administered Date(s) Administered  . Influenza Inj Mdck Quad Pf 02/09/2018  . Influenza Split 04/03/2012, 02/08/2013, 02/12/2014  . Influenza,inj,Quad PF,6+ Mos 02/16/2017  . Influenza-Unspecified 02/13/2015, 02/18/2016, 02/16/2017  . Pneumococcal Polysaccharide-23 02/08/2013  . Tdap 02/12/2014  . Zoster 08/26/2014  . Zoster Recombinat (Shingrix) 02/16/2017, 06/23/2017   Last Pap smear: 09/2017, normal, no high risk HPV Last mammogram: 11/2017, scheduled Last colonoscopy:04/2014--tubular adenoma. Repeat 5  years Last DEXA: 11/2016: T-1.7 spine (prev 09/2014 T-1.6spine, 12/2012 T-1.3) Dentist: twice yearly Ophtho: yearly ([facility]) Exercise:Since working from home (due to pandemic), has been exercising more--able to get on the treadmill more frequently.  She walks on the treadmill at least an hour/day. Uses weights 3-5x/week. She does yoga for 10-15 minutes daily, before walking. Vitamin D-OH was 34 in 11/2016 Lipids: Lab Results  Component Value Date   CHOL 214 (H) 09/29/2016   HDL 102 09/29/2016   LDLCALC 98 09/29/2016   TRIG 68 09/29/2016   CHOLHDL 2.1 09/29/2016   Thyroid screen: Lab Results  Component Value Date   TSH 1.270 01/24/2018   Other doctors caring for patient include: Dr. Estanislado Pandy-- Rheum Dr. Hilty--cardiology Dr. Tawni Millers Dr. Donnetta Simpers Dr. Allene Pyo  Fall and depression screens are negative  Past Medical History:  Diagnosis Date  . Apical variant hypertrophic cardiomyopathy (HCC)    Dr. Debara Pickett  . Colon polyp   . 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 Hypertension    Dr. Debara Pickett  . Osteoarthritis    Dr. Estanislado Pandy (NOT rheumatoid)  . PSVT (paroxysmal supraventricular tachycardia) ([place])     Past Surgical History:  Procedure Laterality Date  . BREAST BIOPSY Right 1988   fibroadenoma (benign)  . CARDIAC CATHETERIZATION  04/12/2010   LAD with 30% narrowing - minimal CAD (Dr. Alma Friendly)  . TRANSTHORACIC ECHOCARDIOGRAM  03/2010   EF=>55%, apical hypertrophy, mod conc LVH; mild MR; trace TR with normal RVSP  . TUBAL LIGATION      Social History   Socioeconomic History  . Marital status: Married    Spouse name: Not on file  .  Number of children: 3  . Years of education: Not on file  . Highest education level: Not on file  Occupational History  . Occupation: Patent attorney: [employer]  Social Needs  . Financial resource strain: Not on file  . Food insecurity    Worry: Not on file    Inability: Not on file  .  Transportation needs    Medical: Not on file    Non-medical: Not on file  Tobacco Use  . Smoking status: Never Smoker  . Smokeless tobacco: Never Used  Substance and Sexual Activity  . Alcohol use: Yes    Comment: RARELY  . Drug use: No  . Sexual activity: Yes    Partners: Male  Lifestyle  . Physical activity    Days per week: Not on file    Minutes per session: Not on file  . Stress: Not on file  Relationships  . Social Herbalist on phone: Not on file    Gets together: Not on file    Attends religious service: Not on file    Active member of club or organization: Not on file    Attends meetings of clubs or organizations: Not on file    Relationship status: Not on file  . Intimate partner violence    Fear of current or ex partner: Not on file    Emotionally abused: Not on file    Physically abused: Not on file    Forced sexual activity: Not on file  Other Topics Concern  . Not on file  Social History Narrative   Married.  Lives with her husband.  Oldest daughter lives in [place], son and daughter lives in [place], [place].  5 grandchildren.  (lost 1 daughter at the age of 2.5 yo due to illness)    Family History  Problem Relation Age of Onset  . Lung cancer Father   . Cancer Father   . COPD Brother   . Hyperlipidemia Brother   . Other Sister        brain tumor (benign)  . Hypertension Mother   . Supraventricular tachycardia Daughter   . Hyperlipidemia Son   . Cancer Sister        stomach cancer  . Cancer Sister 73       metastatic (in abdomen), unknown primary, died 2 mos later  . Diabetes Neg Hx   . Colon cancer Neg Hx   . Breast cancer Neg Hx     Outpatient Encounter Medications as of 12/12/2018  Medication Sig Note  . aspirin 325 MG EC tablet Take 650 mg by mouth daily as needed for pain.   Marland Kitchen b complex vitamins tablet Take 1 tablet by mouth daily.   Marland Kitchen CALCIUM PO Take 1 tablet by mouth daily.    . Cholecalciferol (VITAMIN D PO) Take 1 capsule by  mouth daily.  12/12/2018: Unsure of dose  . hydroxychloroquine (PLAQUENIL) 200 MG tablet Take 1 tablet (200 mg total) by mouth daily.   Marland Kitchen losartan (COZAAR) 50 MG tablet TAKE ONE TABLET BY MOUTH ONE TIME DAILY    . metoprolol succinate (TOPROL-XL) 25 MG 24 hr tablet Take 0.5 tablets (12.5 mg total) by mouth daily.   . Omega-3 Fatty Acids (FISH OIL) 1000 MG CAPS Take 1 capsule by mouth daily.   . [DISCONTINUED] hydroxychloroquine (PLAQUENIL) 200 MG tablet     No facility-administered encounter medications on file as of 12/12/2018.  Allergies  Allergen Reactions  . Septra [Bactrim] Swelling    Couldn't breathe, lip swelling    ROS: The patient denies anorexia, fever, vision changes, decreased hearing, ear pain, sore throat, breast concerns, chest pain, dizziness, syncope, dyspnea on exertion, cough, swelling, nausea, vomiting, diarrhea, constipation, abdominal pain, melena, hematochezia, indigestion/heartburn, hematuria, incontinence, dysuria, postmenopausal bleeding, vaginal discharge, odor or itch, genital lesions, weakness, tremor, suspicious skin lesions, abnormal bleeding/bruising, or enlarged lymph nodes. Ringing in her right ear (not new); denies hearing loss. Raynaud's in winter only, mild.  Didn't have it this past winter. Delay/trouble in having orgasms. No longer having knee pain. Arthritis pain/stiffness in her fingers, no other areas currently.   PHYSICAL EXAM:  BP 120/70   Pulse 72   Temp 97.7 F (36.5 C) (Temporal)   Ht 5' 1.5" (1.562 m)   Wt 165 lb 6.4 oz (75 kg)   BMI 30.75 kg/m    Wt Readings from Last 3 Encounters:  11/05/18 166 lb (75.3 kg)  06/07/18 167 lb 3.2 oz (75.8 kg)  03/07/18 166 lb 12.8 oz (75.7 kg)   General Appearance:   Alert, cooperative, no distress, appears stated age  Head:   Normocephalic, without obvious abnormality, atraumatic  Eyes:   PERRL, conjunctiva/corneas clear, EOM's intact, fundi benign  Ears:   Normal TM's and  external ear canals  Nose:  Not examined (wearing mask due to COVID-19 pandemic)  Throat:  Not examined (wearing mask due to COVID-19 pandemic)  Neck:  Supple, no lymphadenopathy; thyroid: noenlargement/ tenderness/nodules; no carotidbruit or JVD  Back:  Spine nontender, no curvature, ROM normal, no CVAtenderness  Lungs:   Clear to auscultation bilaterally without wheezes, rales or ronchi; respirations unlabored  Chest Wall:   No tenderness or deformity  Heart:   Regular rate and rhythm, S1 and S2 normal, no murmur, rub or gallop  Breast Exam:   No tenderness, masses, or nipple discharge or inversion.No axillary lymphadenopathy. WHSS right upper breast  Abdomen:   Soft, non-tender, nondistended, normoactive bowel sounds, no masses, no hepatosplenomegaly  Genitalia:   Normal external genitalia without lesions. BUS and vagina normal, some atrophic changes; no cervical motion tenderness. No abnormal vaginal discharge. Uterus and adnexa not enlarged, nontender, no masses. Pap not performed  Rectal:   Normal tone, no masses or tenderness; heme negative stool  Extremities:  No clubbing, cyanosis or edema.   Pulses:  2+ and symmetric all extremities  Skin:  Skin color, texture, turgor normal, no rashes or lesions. Discoloration in toenails (left 2, 4, 5); lost distal portion of right great toenail, growing in normally  Lymph nodes:  Cervical, supraclavicular, and axillary nodes normal  Neurologic:  CNII-XII intact, normal strength, gait; reflexes 2+ and symmetric throughout.    Psych: Normal mood, affect, hygiene and grooming   Recent labs reviewed:   Chemistry      Component Value Date/Time   NA 139 11/05/2018 0917   NA 141 01/24/2018 1605   K 4.7 11/05/2018 0917   CL 104 11/05/2018 0917   CO2 27 11/05/2018 0917   BUN 14 11/05/2018 0917   BUN 17 01/24/2018 1605   CREATININE 0.85 11/05/2018 0917       Component Value Date/Time   CALCIUM 9.3 11/05/2018 0917   ALKPHOS 49 11/25/2016 1233   AST 29 11/05/2018 0917   ALT 21 11/05/2018 0917   BILITOT 0.6 11/05/2018 0917     Lab Results  Component Value Date   WBC 5.3 11/05/2018   HGB 13.7  11/05/2018   HCT 41.5 11/05/2018   MCV 91.2 11/05/2018   PLT 191 11/05/2018    ASSESSMENT/PLAN:  Annual physical exam   Apical variant hypertrophic cardiomyopathy (HCC) - stable on current regimen -  Discoid lupus erythematosus - controlled  Osteopenia of lumbar spine - repeat DEXA next year. Ca, D, weight-bearing exercise recommended   PSVT (paroxysmal supraventricular tachycardia) (HCC) - cont beta blocker.  Discussed methods to break SVT.  f/u with cardiologist if occuring frequently   Need for pneumococcal vaccination - Plan: Pneumococcal conjugate vaccine 13-valent  Sexual problems - delayed orgasms; counseled in detail    Discussed monthly self breast exams and yearly mammograms; at least 30 minutes of aerobic activity at least 5 days/week, weight-bearing exercise at least 2x/wk; proper sunscreen use reviewed; healthy diet, including goals of calcium and vitamin D intake and alcohol recommendations (less than or equal to 1 drink/day) reviewed; regular seatbelt use; changing batteries in smoke detectors. Colonoscopy due 04/2019.  Immunizations--continue yearly flu shots (now to be high dose); Prevnar-13 given today. Pneumovax next year. Repeat DEXA next year.  Given paperwork for living will and healthcare power of attorney.  F/u 1 year, sooner prn.

## 2018-12-12 ENCOUNTER — Ambulatory Visit (INDEPENDENT_AMBULATORY_CARE_PROVIDER_SITE_OTHER): Payer: 59 | Admitting: Family Medicine

## 2018-12-12 ENCOUNTER — Other Ambulatory Visit: Payer: Self-pay

## 2018-12-12 ENCOUNTER — Encounter: Payer: Self-pay | Admitting: Family Medicine

## 2018-12-12 VITALS — BP 120/70 | HR 72 | Temp 97.7°F | Ht 61.5 in | Wt 165.4 lb

## 2018-12-12 DIAGNOSIS — L930 Discoid lupus erythematosus: Secondary | ICD-10-CM

## 2018-12-12 DIAGNOSIS — F529 Unspecified sexual dysfunction not due to a substance or known physiological condition: Secondary | ICD-10-CM

## 2018-12-12 DIAGNOSIS — I422 Other hypertrophic cardiomyopathy: Secondary | ICD-10-CM

## 2018-12-12 DIAGNOSIS — I471 Supraventricular tachycardia: Secondary | ICD-10-CM

## 2018-12-12 DIAGNOSIS — M8588 Other specified disorders of bone density and structure, other site: Secondary | ICD-10-CM | POA: Diagnosis not present

## 2018-12-12 DIAGNOSIS — Z23 Encounter for immunization: Secondary | ICD-10-CM

## 2018-12-12 DIAGNOSIS — Z0000 Encounter for general adult medical examination without abnormal findings: Secondary | ICD-10-CM | POA: Diagnosis not present

## 2018-12-12 DIAGNOSIS — IMO0001 Reserved for inherently not codable concepts without codable children: Secondary | ICD-10-CM

## 2018-12-15 LAB — HM MAMMOGRAPHY

## 2018-12-20 ENCOUNTER — Encounter: Payer: Self-pay | Admitting: Family Medicine

## 2019-01-16 ENCOUNTER — Other Ambulatory Visit (INDEPENDENT_AMBULATORY_CARE_PROVIDER_SITE_OTHER): Payer: 59

## 2019-01-16 ENCOUNTER — Other Ambulatory Visit: Payer: Self-pay

## 2019-01-16 DIAGNOSIS — Z23 Encounter for immunization: Secondary | ICD-10-CM | POA: Diagnosis not present

## 2019-02-05 ENCOUNTER — Other Ambulatory Visit: Payer: Self-pay | Admitting: Physician Assistant

## 2019-02-05 ENCOUNTER — Other Ambulatory Visit: Payer: Self-pay | Admitting: Internal Medicine

## 2019-02-05 DIAGNOSIS — L930 Discoid lupus erythematosus: Secondary | ICD-10-CM

## 2019-02-05 NOTE — Telephone Encounter (Signed)
Last Visit: 11/05/18 Next Visit: 04/08/19 Labs: 11/05/18 WNL PLQ Eye Exam: 03/13/18 WNL   Okay to refill per Dr. Estanislado Pandy

## 2019-02-13 ENCOUNTER — Encounter: Payer: Self-pay | Admitting: Family Medicine

## 2019-03-14 ENCOUNTER — Other Ambulatory Visit: Payer: Self-pay | Admitting: Internal Medicine

## 2019-03-25 NOTE — Progress Notes (Deleted)
Office Visit Note  Patient: Alicia Raymond             Date of Birth: 03/06/1956           MRN: [id number]             PCP: Rita Ohara, MD Referring: Rita Ohara, MD Visit Date: 04/08/2019 Occupation: @GUAROCC @  Subjective:  No chief complaint on file.   History of Present Illness: Alicia Raymond is a 63 y.o. female ***   Activities of Daily Living:  Patient reports morning stiffness for *** {minute/hour:19697}.   Patient {ACTIONS;DENIES/REPORTS:21021675::"Denies"} nocturnal pain.  Difficulty dressing/grooming: {ACTIONS;DENIES/REPORTS:21021675::"Denies"} Difficulty climbing stairs: {ACTIONS;DENIES/REPORTS:21021675::"Denies"} Difficulty getting out of chair: {ACTIONS;DENIES/REPORTS:21021675::"Denies"} Difficulty using hands for taps, buttons, cutlery, and/or writing: {ACTIONS;DENIES/REPORTS:21021675::"Denies"}  No Rheumatology ROS completed.   PMFS History:  Patient Active Problem List   Diagnosis Date Noted  . Lupus ([place])   . Hypertension   . Colon polyp   . Heart palpitations 04/17/2017  . Photosensitivity 11/21/2016  . Raynaud's disease without gangrene 11/21/2016  . High risk medication use 11/21/2016  . 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 Primary osteoarthritis of both hands 03/25/2016  . Arrhythmia 03/25/2016  . Atypical chest pain 11/21/2013  . Apical variant hypertrophic cardiomyopathy ([place]) 11/09/2012  . Essential hypertension 11/09/2012  . Systemic lupus ([place]) 11/30/2009    Past Medical History:  Diagnosis Date  . Apical variant hypertrophic cardiomyopathy (HCC)    Dr. Debara Pickett  . Colon polyp   . 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 Hypertension    Dr. Debara Pickett  . Osteoarthritis    Dr. Estanislado Pandy (NOT rheumatoid)  . PSVT (paroxysmal supraventricular tachycardia) (HCC)     Family History  Problem Relation Age of Onset  . Lung cancer Father   . Cancer Father   . COPD Brother   . Hyperlipidemia Brother   . Other Sister        brain tumor (benign)  .  Hypertension Mother   . Supraventricular tachycardia Daughter   . Hyperlipidemia Son   . Cancer Sister        stomach cancer  . Cancer Sister 42       metastatic (in abdomen), unknown primary, died 2 mos later  . Diabetes Neg Hx   . Colon cancer Neg Hx   . Breast cancer Neg Hx    Past Surgical History:  Procedure Laterality Date  . BREAST BIOPSY Right 1988   fibroadenoma (benign)  . CARDIAC CATHETERIZATION  04/12/2010   LAD with 30% narrowing - minimal CAD (Dr. Alma Friendly)  . TRANSTHORACIC ECHOCARDIOGRAM  03/2010   EF=>55%, apical hypertrophy, mod conc LVH; mild MR; trace TR with normal RVSP  . TUBAL LIGATION     Social History   Social History Narrative   Married.  Lives with her husband.  Oldest daughter lives in [place], son and daughter lives in [place], [place].  5 grandchildren.  (lost 1 daughter at the age of 2.5 yo due to illness)   Immunization History  Administered Date(s) Administered  . Fluad Quad(high Dose 65+) 01/16/2019  . Influenza Inj Mdck Quad Pf 02/09/2018  . Influenza Split 04/03/2012, 02/08/2013, 02/12/2014  . Influenza,inj,Quad PF,6+ Mos 02/16/2017  . Influenza-Unspecified 02/13/2015, 02/18/2016, 02/16/2017  . Pneumococcal Conjugate-13 12/12/2018  . Pneumococcal Polysaccharide-23 02/08/2013  . Tdap 02/12/2014  . Zoster 08/26/2014  . Zoster Recombinat (Shingrix) 02/16/2017, 06/23/2017     Objective: Vital Signs: There were no vitals taken for this visit.   Physical Exam   Musculoskeletal  Exam: ***  CDAI Exam: CDAI Score: - Patient Global: -; Provider Global: - Swollen: -; Tender: - Joint Exam   No joint exam has been documented for this visit   There is currently no information documented on the homunculus. Go to the Rheumatology activity and complete the homunculus joint exam.  Investigation: No additional findings.  Imaging: No results found.  Recent Labs: Lab Results  Component Value Date   WBC 5.3 11/05/2018   HGB 13.7 11/05/2018    PLT 191 11/05/2018   NA 139 11/05/2018   K 4.7 11/05/2018   CL 104 11/05/2018   CO2 27 11/05/2018   GLUCOSE 95 11/05/2018   BUN 14 11/05/2018   CREATININE 0.85 11/05/2018   BILITOT 0.6 11/05/2018   ALKPHOS 49 11/25/2016   AST 29 11/05/2018   ALT 21 11/05/2018   PROT 6.8 11/05/2018   ALBUMIN 4.1 11/25/2016   CALCIUM 9.3 11/05/2018   GFRAA 83 11/05/2018    Speciality Comments: PLQ Eye Exam: 03/13/18 WNL @ [facility] follow up in 1 year  Procedures:  No procedures performed Allergies: Septra [bactrim]   Assessment / Plan:     Visit Diagnoses: No diagnosis found.  Orders: No orders of the defined types were placed in this encounter.  No orders of the defined types were placed in this encounter.   Face-to-face time spent with patient was *** minutes. Greater than 50% of time was spent in counseling and coordination of care.  Follow-Up Instructions: No follow-ups on file.   Ofilia Neas, PA-C  Note - This record has been created using Dragon software.  Chart creation errors have been sought, but may not always  have been located. Such creation errors do not reflect on  the standard of medical care.

## 2019-04-08 ENCOUNTER — Ambulatory Visit: Payer: 59 | Admitting: Physician Assistant

## 2019-05-10 ENCOUNTER — Telehealth: Payer: Self-pay | Admitting: Rheumatology

## 2019-05-10 ENCOUNTER — Other Ambulatory Visit: Payer: Self-pay | Admitting: Rheumatology

## 2019-05-10 DIAGNOSIS — Z79899 Other long term (current) drug therapy: Secondary | ICD-10-CM

## 2019-05-10 DIAGNOSIS — L930 Discoid lupus erythematosus: Secondary | ICD-10-CM

## 2019-05-10 MED ORDER — HYDROXYCHLOROQUINE SULFATE 200 MG PO TABS: 200.0000 mg | ORAL_TABLET | Freq: Every day | ORAL | 0 refills | Status: DC

## 2019-05-10 NOTE — Telephone Encounter (Signed)
Patient has eye exam scheduled for June 09, 2019 and she will go for labs today. Patient complained of the cost of a 30 day supply, so I found it for $11.41 on goodrx.com at [employer]. Patient requested the prescription be sent to [facility]. Patient will take good rx coupon to [employer].   Lab orders released for [facility].

## 2019-05-10 NOTE — Telephone Encounter (Signed)
Patient has eye exam scheduled for June 09, 2019 and she will go for labs today. Patient complained of the cost of a 30 day supply, so I found it for $11.41 on goodrx.com at [employer]. Patient requested the prescription be sent to [facility] on [facility]. Patient will take good rx coupon to [employer].

## 2019-05-10 NOTE — Telephone Encounter (Signed)
-----   Message from [place] sent at 05/10/2019  9:02 AM EST ----- Please schedule follow up, patient was due 03/2019. Thanks!

## 2019-05-10 NOTE — Telephone Encounter (Signed)
Patient called requesting her labwork orders be sent to [facility] on [address].  Patient states she will go after work today.    Patient states she will also call her eye doctor to try and have her Plaquenil eye exam next week so she can have a 90 day supply of Plaquenil.

## 2019-05-10 NOTE — Telephone Encounter (Signed)
Opened in error

## 2019-05-10 NOTE — Telephone Encounter (Signed)
Last Visit: 11/05/2018 Next Visit: message sent to the front desk to schedule.  Labs: 11/05/2018 CBC and CMP WNL Eye exam: 03/13/2018   Attempted to contact patient and left message on machine to advise patient she is due to update labs (at a [facility] or [facility]) and update eye exam. Advised patient to call with preferred lab location so orders can be released.   Okay to refill 30 day supply of PLQ?

## 2019-05-10 NOTE — Telephone Encounter (Signed)
Ok to refill 30 day supply.  She will require lab work and eye exam ASAP.

## 2019-05-10 NOTE — Telephone Encounter (Signed)
Patient states she will call back to schedule her follow-up appointment after she gets her eye exam and labwork.

## 2019-05-11 LAB — CMP14+EGFR
ALT: 24 IU/L (ref 0–32)
AST: 34 IU/L (ref 0–40)
Albumin/Globulin Ratio: 1.5 (ref 1.2–2.2)
Albumin: 4.4 g/dL (ref 3.8–4.8)
Alkaline Phosphatase: 71 IU/L (ref 39–117)
BUN/Creatinine Ratio: 15 (ref 12–28)
BUN: 12 mg/dL (ref 8–27)
Bilirubin Total: 0.5 mg/dL (ref 0.0–1.2)
CO2: 24 mmol/L (ref 20–29)
Calcium: 9.7 mg/dL (ref 8.7–10.3)
Chloride: 101 mmol/L (ref 96–106)
Creatinine, Ser: 0.79 mg/dL (ref 0.57–1.00)
GFR calc Af Amer: 91 mL/min/{1.73_m2} (ref >59)
GFR calc non Af Amer: 79 mL/min/{1.73_m2} (ref >59)
Globulin, Total: 3 g/dL (ref 1.5–4.5)
Glucose: 93 mg/dL (ref 65–99)
Potassium: 4.9 mmol/L (ref 3.5–5.2)
Sodium: 140 mmol/L (ref 134–144)
Total Protein: 7.4 g/dL (ref 6.0–8.5)

## 2019-05-11 LAB — CBC WITH DIFFERENTIAL/PLATELET
Basophils Absolute: 0.1 10*3/uL (ref 0.0–0.2)
Basos: 1 %
EOS (ABSOLUTE): 0.1 10*3/uL (ref 0.0–0.4)
Eos: 3 %
Hematocrit: 46.1 % (ref 34.0–46.6)
Hemoglobin: 14.8 g/dL (ref 11.1–15.9)
Immature Grans (Abs): 0 10*3/uL (ref 0.0–0.1)
Immature Granulocytes: 0 %
Lymphocytes Absolute: 2.7 10*3/uL (ref 0.7–3.1)
Lymphs: 50 %
MCH: 29.4 pg (ref 26.6–33.0)
MCHC: 32.1 g/dL (ref 31.5–35.7)
MCV: 92 fL (ref 79–97)
Monocytes Absolute: 0.5 10*3/uL (ref 0.1–0.9)
Monocytes: 8 %
Neutrophils Absolute: 2.1 10*3/uL (ref 1.4–7.0)
Neutrophils: 38 %
Platelets: 231 10*3/uL (ref 150–450)
RBC: 5.04 x10E6/uL (ref 3.77–5.28)
RDW: 13.4 % (ref 11.7–15.4)
WBC: 5.5 10*3/uL (ref 3.4–10.8)

## 2019-06-13 ENCOUNTER — Other Ambulatory Visit: Payer: Self-pay | Admitting: Physician Assistant

## 2019-06-13 DIAGNOSIS — L930 Discoid lupus erythematosus: Secondary | ICD-10-CM

## 2019-06-18 ENCOUNTER — Telehealth: Payer: Self-pay | Admitting: Rheumatology

## 2019-06-18 ENCOUNTER — Ambulatory Visit (INDEPENDENT_AMBULATORY_CARE_PROVIDER_SITE_OTHER): Payer: 59 | Admitting: Internal Medicine

## 2019-06-18 ENCOUNTER — Other Ambulatory Visit: Payer: Self-pay

## 2019-06-18 ENCOUNTER — Encounter: Payer: Self-pay | Admitting: Internal Medicine

## 2019-06-18 VITALS — BP 111/77 | HR 69 | Temp 96.6°F | Ht 61.0 in | Wt 165.2 lb

## 2019-06-18 DIAGNOSIS — I471 Supraventricular tachycardia: Secondary | ICD-10-CM

## 2019-06-18 DIAGNOSIS — I422 Other hypertrophic cardiomyopathy: Secondary | ICD-10-CM

## 2019-06-18 MED ORDER — METOPROLOL SUCCINATE ER 25 MG PO TB24: 12.5000 mg | ORAL_TABLET | Freq: Every day | ORAL | 3 refills | Status: DC

## 2019-06-18 NOTE — Progress Notes (Signed)
Virtual Visit via Telephone Note  I connected with Alicia Raymond on 06/19/19 at 10:45 AM EST by telephone and verified that I am speaking with the correct person using two identifiers.  Location: Patient: Home  Provider: Clinic  This service was conducted via virtual visit. The patient was located at home. I was located in my office.  Consent was obtained prior to the virtual visit and is aware of possible charges through their insurance for this visit.  The patient is an established patient.  Dr. Estanislado Pandy, MD conducted the virtual visit and Hazel Sams, PA-C acted as scribe during the service.  Office staff helped with scheduling follow up visits after the service was conducted.     I discussed the limitations, risks, security and privacy concerns of performing an evaluation and management service by telephone and the availability of in person appointments. I also discussed with the patient that there may be a patient responsible charge related to this service. The patient expressed understanding and agreed to proceed.  CC: Medication monitoring  History of Present Illness: Patient is a 64 year old female with a past medical history of discoid lupus and Raynaud's.  She is taking plaquenil 200 mg 1 tablet by mouth daily. She denies any recent discoid lupus flares. She wears sunscreen Spf 50 daily.  She has not had any recent symptoms of Raynaud's.  She denies any discomfort or joint swelling in her hands or feet.    Review of Systems  Constitutional: Negative for fever and malaise/fatigue.  HENT: Negative for ear pain.        Denies oral or nasal ulcerations Denies mouth dryness   Eyes: Negative for photophobia, pain, discharge and redness.       Denies eye dryness   Respiratory: Negative for cough, shortness of breath and wheezing.   Cardiovascular: Negative for chest pain and palpitations.  Gastrointestinal: Negative for blood in stool, constipation and diarrhea.  Genitourinary:  Negative for dysuria and urgency.  Musculoskeletal: Negative for back pain, joint pain, myalgias and neck pain.  Skin: Negative for rash.  Neurological: Negative for dizziness, weakness and headaches.  Endo/Heme/Allergies: Does not bruise/bleed easily.  Psychiatric/Behavioral: Negative for depression. The patient is not nervous/anxious and does not have insomnia.       Observations/Objective: Physical Exam  Constitutional: She is oriented to person, place, and time.  Neurological: She is alert and oriented to person, place, and time.  Psychiatric: Mood, memory, affect and judgment normal.   Patient reports morning stiffness for  0 minutes.   Patient denies nocturnal pain.  Difficulty dressing/grooming: Denies Difficulty climbing stairs: Denies Difficulty getting out of chair: Denies Difficulty using hands for taps, buttons, cutlery, and/or writing: Denies   Assessment and Plan: Visit Diagnoses: Discoid lupus - Biopsy proven: No discoid lesions present.  She has not had any recent rashes.  She wears sunscreen SPF 50 daily.  We discussed the importance of wearing sunscreen daily and avoiding direct sun exposure.  She is clinically doing well on Plaquenil 200 mg 1 tablet by mouth daily.  She has no signs or symptoms of systemic lupus at this time.  She will continue taking PLQ as prescribed. A refill of plaquenil was sent to the pharmacy.  She was advised to notify us if she develops any signs or symptoms of a flare.  She will follow up in 5 months.   High risk medication use - Plaquenil 200 mg 1 tablet by mouth daily. PLQ eye exam on 06/11/19  at [facility].  Follow up in 1 year.    CBC and CMP WNL on 05/10/19.    Raynaud's disease without gangrene -Not currently active.  She has not had any digital ulcerations.    Photosensitivity - She wears sunscreen SPF 50 daily.  We discussed the importance of wearing sunscreen daily, sun protective clothing, and avoiding direct sun exposure.    Primary osteoarthritis of both hands - She is not having any hand pain or inflammation at this time.   Primary osteoarthritis of both feet - She denies any feet pain or inflammation.    Other fatigue - She has not had any fatigue recently.   History of hypertension   Follow Up Instructions: She will follow up in 5 months.    I discussed the assessment and treatment plan with the patient. The patient was provided an opportunity to ask questions and all were answered. The patient agreed with the plan and demonstrated an understanding of the instructions.   The patient was advised to call back or seek an in-person evaluation if the symptoms worsen or if the condition fails to improve as anticipated.  I provided 15  minutes of non-face-to-face time during this encounter.   Bo Merino, MD   Scribed byLovena Le Dale,PA-C

## 2019-06-18 NOTE — Patient Instructions (Signed)
Medication Instructions:  Your physician recommends that you continue on your current medications as directed. Please refer to the Current Medication list given to you today.  *If you need a refill on your cardiac medications before your next appointment, please call your pharmacy*  Follow-Up: At [facility], you and your health needs are our priority.  As part of our continuing mission to provide you with exceptional heart care, we have created designated Provider Care Teams.  These Care Teams include your primary Cardiologist (physician) and Advanced Practice Providers (APPs -  Physician Assistants and Nurse Practitioners) who all work together to provide you with the care you need, when you need it.  Your next appointment:   12 month(s)  The format for your next appointment:   In Person  Provider:   You may see Kenneth C Hilty, MD or one of the following Advanced Practice Providers on your designated Care Team:    Hao Meng, PA-C  Angela Duke, PA-C or   Krista Kroeger, PA-C   Other Instructions   

## 2019-06-18 NOTE — Telephone Encounter (Signed)
Patient left a voicemail stating she received a call from her pharmacy telling her Dr. Estanislado Pandy denied her prescription refill.  Patient states "she doesn't understand why this keeps happening" and requested a return call ASAP.

## 2019-06-18 NOTE — Telephone Encounter (Signed)
Patient advised she is due a follow up visit. Patient has been scheduled a virtual visit on 06/19/19. Patient advised will send prescription after her visit tomorrow. Patient verbalized understanding.

## 2019-06-18 NOTE — Progress Notes (Signed)
OFFICE NOTE  Chief Complaint:  Routine followup  Primary Care Physician: Rita Ohara, MD  HPI:  Alicia Raymond is a 64 year old female who has a history of apical hypertrophic cardiomyopathy as well as mild hypertension, rheumatoid arthritis and lupus. Overall, those connective tissue disorders have been very well controlled and she has been on losartan, which is hopefully going to be helpful with providing her some prevention from apical hypertrophy. Over the past year, she has stopped exercising, and gained a small amount of weight. She has had a couple of episodes of chest discomfort at night over the past year however they have subsided.  I had the pleasure of seeing Alicia Raymond back in the office today. Overall she's done very well over the past year. She started doing significant exercise almost every morning. Her weight is down now more than 15 pounds to 153. Blood pressure is well controlled at 118/70. She remains on losartan, mostly for apical hypertrophic cardiomyopathy. She's had no chest pain or worsening shortness of breath. She reports her rheumatoid arthritis is better controlled in fact she is on a lower dose of Plaquenil.  01/08/2016  Alicia Raymond returns today for follow-up. She feels that she is doing really well. She lost another 3 pounds and denies any chest pain or worsening shortness of breath. Unfortunately her sister is quite ill in [place] and she is under a lot of stress as she's been traveling and visiting with her. Blood pressure appears excellent today. Her last echocardiogram was in 2011 and has not been reassessed since that time. EKG shows sinus rhythm with LVH and T-wave changes apically and anterolaterally consistent with her prior diagnosis of apical hypertrophic cardiomyopathy. With regards to her RA and lupus those have been well controlled on low-dose plaquenil.  04/17/2017  Alicia Raymond was seen today as an add-on for "heart pounding".  She reports over  the past week she has felt like her heart was racing or pounding.  She says it occasionally is associated with discomfort in the chest and some numbness in her left arm.  Afterward she feels fatigued.  It is not worse with exertion or relieved by rest, and she is continued to be able to do exercises on a treadmill.  She says it comes on randomly and is associated with fatigue.  She says that her husband listened to her heart and noted that it was pounding harder and faster than she remembered.  05/22/2017  Alicia Raymond returns today for follow-up of her monitor results.  This did not show any extrasystoles or arrhythmias that would be associated with her symptoms of palpitations.  She says they seem to be worse when having disagreements with her husband and I suspect they are related to stress.  She has had a little physical responsibilities over the past week and has felt much better.  03/07/2018  Alicia Raymond returns today for follow-up.  She wore a monitor in November which demonstrated no arrhythmias to explain her symptoms.  She does have a history of tachypalpitations.  Recently she was in the emergency department and an EKG was able to capture an SVT with heart rate into the 160s.  I was notified by the ER physician and recommended starting her on Toprol-XL 25 mg daily.  She reports taking the medicine although feels like she might be a little fatigue.  In general though her energy is much better and she says she has had significant improvement in her palpitations.  She took a week vacation with  her husband and and they went to the beach.  She was able to walk long distances and was a lot more active without having to stop and she was previously.  06/07/2018  Alicia Raymond was seen today in follow-up.  Overall she is doing well on Toprol.  She denies any recurrent SVT.  She has no concerning palpitations.  She denies chest pain or worsening shortness of breath.  She started walking more recently about 3 miles a day.  She  still battling the weight somewhat however does report eating fairly healthy diet.  06/18/2019  Alicia Raymond is seen today for annual follow-up.  Overall she reports good control of her palpitations although she occasionally has some brief breakthroughs.  It does not seem that she has enough episodes to warrant an increase in her medication.  She has no other symptoms related to apical hypertrophic cardiomyopathy.  She reports some occasional dizziness.  Blood pressure is well controlled today.  She continues to exercise regularly.  She is disappointed that she is not recently lost any weight because she says she is exercising for 40 to 60 minutes with both aerobic and resistance training, 3 times a day.  I thought this might be a little too much, but if she persists, I expect that she may eventually lose weight.  PMHx:  Past Medical History:  Diagnosis Date  . Apical variant hypertrophic cardiomyopathy (HCC)    Dr. Debara Pickett  . Colon polyp   . 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 Hypertension    Dr. Debara Pickett  . Osteoarthritis    Dr. Estanislado Pandy (NOT rheumatoid)  . PSVT (paroxysmal supraventricular tachycardia) ([place])     Past Surgical History:  Procedure Laterality Date  . BREAST BIOPSY Right 1988   fibroadenoma (benign)  . CARDIAC CATHETERIZATION  04/12/2010   LAD with 30% narrowing - minimal CAD (Dr. Alma Friendly)  . TRANSTHORACIC ECHOCARDIOGRAM  03/2010   EF=>55%, apical hypertrophy, mod conc LVH; mild MR; trace TR with normal RVSP  . TUBAL LIGATION      FAMHx:  Family History  Problem Relation Age of Onset  . Lung cancer Father   . Cancer Father   . COPD Brother   . Hyperlipidemia Brother   . Other Sister        brain tumor (benign)  . Hypertension Mother   . Supraventricular tachycardia Daughter   . Hyperlipidemia Son   . Cancer Sister        stomach cancer  . Cancer Sister 82       metastatic (in abdomen), unknown primary, died 2 mos later  . Diabetes Neg Hx   . Colon cancer Neg Hx   . Breast  cancer Neg Hx     SOCHx:   reports that she has never smoked. She has never used smokeless tobacco. She reports current alcohol use. She reports that she does not use drugs.  ALLERGIES:  Allergies  Allergen Reactions  . Septra [Bactrim] Swelling    Couldn't breathe, lip swelling    ROS: Pertinent items noted in HPI and remainder of comprehensive ROS otherwise negative.  HOME MEDS: Current Outpatient Medications  Medication Sig Dispense Refill  . aspirin 325 MG EC tablet Take 650 mg by mouth daily as needed for pain.    Marland Kitchen b complex vitamins tablet Take 1 tablet by mouth daily.    Marland Kitchen CALCIUM PO Take 1 tablet by mouth daily.     . Cholecalciferol (VITAMIN D PO) Take 1 capsule by mouth daily.     Marland Kitchen  hydroxychloroquine (PLAQUENIL) 200 MG tablet Take 1 tablet (200 mg total) by mouth daily. 30 tablet 0  . losartan (COZAAR) 50 MG tablet TAKE ONE TABLET BY MOUTH ONE TIME DAILY  90 tablet 1  . metoprolol succinate (TOPROL-XL) 25 MG 24 hr tablet TAKE HALF TABLET BY MOUTH DAILY  45 tablet 1  . Omega-3 Fatty Acids (FISH OIL) 1000 MG CAPS Take 1 capsule by mouth daily.     No current facility-administered medications for this visit.    LABS/IMAGING: No results found for this or any previous visit (from the past 48 hour(s)). No results found.  VITALS: BP 111/77   Pulse 69   Temp (!) 96.6 F (35.9 C)   Ht 5\' 1"  (1.549 m)   Wt 165 lb 3.2 oz (74.9 kg)   SpO2 96%   BMI 31.21 kg/m   EXAM: General appearance: alert and no distress Neck: no carotid bruit, no JVD and thyroid not enlarged, symmetric, no tenderness/mass/nodules Lungs: clear to auscultation bilaterally Heart: regular rate and rhythm, S1, S2 normal, no murmur, click, rub or gallop Abdomen: soft, non-tender; bowel sounds normal; no masses,  no organomegaly Extremities: extremities normal, atraumatic, no cyanosis or edema Pulses: 2+ and symmetric Skin: Skin color, texture, turgor normal. No rashes or lesions Neurologic:  Grossly normal : Pleasant  EKG: Normal sinus rhythm at 68, inferior and anterolateral T wave inversions, known apical hypertrophic cardiomyopathy-personally reviewed  ASSESSMENT: 1. PSVT - improved on BB 2. Apical variant hypertrophic cardiomyopathy 3. Mild hypertension - controlled 4. Rheumatoid arthritis-followed by rheumatologist  PLAN: 1.   Miniya continues to do well on beta-blocker without any significant recurrent SVT.  Her weight has been stable despite quite a bit of exercise every day.  I encouraged her to continue with exercise, resistance training and decrease caloric intake.  No changes to her meds today.  Follow-up annually or sooner as necessary.  Pixie Casino, MD, [facility], [place] Director of the Advanced Lipid Disorders &  Cardiovascular Risk Reduction Clinic Attending Cardiologist  Direct Dial: [phone]  Fax: [phone]  Website:  [email or web address] 06/18/2019, 4:54 PM

## 2019-06-19 ENCOUNTER — Telehealth (INDEPENDENT_AMBULATORY_CARE_PROVIDER_SITE_OTHER): Payer: 59 | Admitting: Rheumatology

## 2019-06-19 ENCOUNTER — Encounter: Payer: Self-pay | Admitting: Rheumatology

## 2019-06-19 DIAGNOSIS — L930 Discoid lupus erythematosus: Secondary | ICD-10-CM

## 2019-06-19 DIAGNOSIS — I7300 Raynaud's syndrome without gangrene: Secondary | ICD-10-CM | POA: Diagnosis not present

## 2019-06-19 DIAGNOSIS — M19041 Primary osteoarthritis, right hand: Secondary | ICD-10-CM

## 2019-06-19 DIAGNOSIS — M19072 Primary osteoarthritis, left ankle and foot: Secondary | ICD-10-CM

## 2019-06-19 DIAGNOSIS — M19071 Primary osteoarthritis, right ankle and foot: Secondary | ICD-10-CM

## 2019-06-19 DIAGNOSIS — Z79899 Other long term (current) drug therapy: Secondary | ICD-10-CM

## 2019-06-19 DIAGNOSIS — L568 Other specified acute skin changes due to ultraviolet radiation: Secondary | ICD-10-CM

## 2019-06-19 DIAGNOSIS — R5383 Other fatigue: Secondary | ICD-10-CM

## 2019-06-19 DIAGNOSIS — Z8679 Personal history of other diseases of the circulatory system: Secondary | ICD-10-CM

## 2019-06-19 DIAGNOSIS — M19042 Primary osteoarthritis, left hand: Secondary | ICD-10-CM

## 2019-06-19 MED ORDER — HYDROXYCHLOROQUINE SULFATE 200 MG PO TABS: 200.0000 mg | ORAL_TABLET | Freq: Every day | ORAL | 0 refills | Status: DC

## 2019-06-24 ENCOUNTER — Telehealth: Payer: Self-pay | Admitting: *Deleted

## 2019-06-24 NOTE — Telephone Encounter (Signed)
I called patient to schedule 5 month f/u appt. Patient wants to transfer to another rheumatologist. Patient will come to the office to complete a medical release form.

## 2019-07-13 ENCOUNTER — Ambulatory Visit: Payer: 59 | Attending: Internal Medicine

## 2019-07-13 DIAGNOSIS — Z23 Encounter for immunization: Secondary | ICD-10-CM | POA: Insufficient documentation

## 2019-07-13 NOTE — Progress Notes (Signed)
   Covid-19 Vaccination Clinic  Name:  Alicia Raymond    MRN: [id number] DOB: June 24, 1955  07/13/2019  [name] was observed post Covid-19 immunization for 15 minutes without incidence. She was provided with Vaccine Information Sheet and instruction to access the V-Safe system.   [name] was instructed to call 911 with any severe reactions post vaccine: Marland Kitchen Difficulty breathing  . Swelling of your face and throat  . A fast heartbeat  . A bad rash all over your body  . Dizziness and weakness    Immunizations Administered    Name Date Dose VIS Date Route   Pfizer COVID-19 Vaccine 07/13/2019  3:06 PM 0.3 mL 05/03/2019 Intramuscular   Manufacturer: [place]   Lot: [id number]   [place]: [id number]

## 2019-08-07 ENCOUNTER — Ambulatory Visit: Payer: 59 | Attending: Internal Medicine

## 2019-08-07 DIAGNOSIS — Z23 Encounter for immunization: Secondary | ICD-10-CM

## 2019-08-07 NOTE — Progress Notes (Signed)
   Covid-19 Vaccination Clinic  Name:  Alicia Raymond    MRN: [id number] DOB: 1955-11-16  08/07/2019  [name] was observed post Covid-19 immunization for 15 minutes without incident. She was provided with Vaccine Information Sheet and instruction to access the V-Safe system.   [name] was instructed to call 911 with any severe reactions post vaccine: Marland Kitchen Difficulty breathing  . Swelling of face and throat  . A fast heartbeat  . A bad rash all over body  . Dizziness and weakness   Immunizations Administered    Name Date Dose VIS Date Route   Pfizer COVID-19 Vaccine 08/07/2019  8:25 AM 0.3 mL 05/03/2019 Intramuscular   Manufacturer: [place]   Lot: [id number]   [place]: [id number]

## 2019-08-12 ENCOUNTER — Other Ambulatory Visit: Payer: Self-pay | Admitting: Internal Medicine

## 2019-09-23 ENCOUNTER — Telehealth: Payer: Self-pay | Admitting: Rheumatology

## 2019-09-23 DIAGNOSIS — L930 Discoid lupus erythematosus: Secondary | ICD-10-CM

## 2019-09-23 MED ORDER — HYDROXYCHLOROQUINE SULFATE 200 MG PO TABS: 200.0000 mg | ORAL_TABLET | Freq: Every day | ORAL | 0 refills | Status: AC

## 2019-09-23 NOTE — Telephone Encounter (Signed)
Patient called requesting prescription refill of Plaquenil to be sent to [facility] at [facility].  Patient states she ran out of medication yesterday and requesting the refill be sent this afternoon.

## 2019-09-23 NOTE — Telephone Encounter (Signed)
Last Visit: 06/19/2019 telemedicine  Next Visit: patient declined to schedule.  Labs: 05/10/2019  CBC and CMP WNL Eye exam:06/11/2019   I called patient to schedule follow and advise she is due for labs. Patient declined both because she is scheduled to see Dr. Lenna Gilford on 10/14/2019.  Okay to refill 30 day supply of plaquenil?

## 2019-09-23 NOTE — Telephone Encounter (Signed)
ok 

## 2019-11-08 ENCOUNTER — Other Ambulatory Visit: Payer: Self-pay

## 2019-11-30 DIAGNOSIS — Z1231 Encounter for screening mammogram for malignant neoplasm of breast: Secondary | ICD-10-CM | POA: Diagnosis not present

## 2019-11-30 LAB — HM MAMMOGRAPHY

## 2019-12-02 ENCOUNTER — Other Ambulatory Visit: Payer: Self-pay | Admitting: Internal Medicine

## 2019-12-02 MED ORDER — METOPROLOL SUCCINATE ER 25 MG PO TB24: 12.5000 mg | ORAL_TABLET | Freq: Every day | ORAL | 1 refills | Status: DC

## 2019-12-02 MED ORDER — LOSARTAN POTASSIUM 50 MG PO TABS: 50.0000 mg | ORAL_TABLET | Freq: Every day | ORAL | 1 refills | Status: DC

## 2019-12-02 NOTE — Telephone Encounter (Signed)
Rx(s) sent to pharmacy electronically.  

## 2019-12-02 NOTE — Telephone Encounter (Signed)
*  STAT* If patient is at the pharmacy, call can be transferred to refill team.   1. Which medications need to be refilled? (please list name of each medication and dose if known) losartan (COZAAR) 50 MG tablet  metoprolol succinate (TOPROL-XL) 25 MG 24 hr tablet    2. Which pharmacy/location (including street and city if local pharmacy) is medication to be sent to? [facility]  3. Do they need a 30 day or 90 day supply? 90 day supply

## 2019-12-05 ENCOUNTER — Telehealth: Payer: Self-pay | Admitting: Internal Medicine

## 2019-12-05 NOTE — Telephone Encounter (Signed)
New Message  Patient was calling in to confirm that medication refill has been sent to [facility]. Confirmed.

## 2019-12-11 ENCOUNTER — Other Ambulatory Visit: Payer: Self-pay

## 2019-12-11 MED ORDER — LOSARTAN POTASSIUM 50 MG PO TABS: 50.0000 mg | ORAL_TABLET | Freq: Every day | ORAL | 1 refills | Status: DC

## 2019-12-11 MED ORDER — METOPROLOL SUCCINATE ER 25 MG PO TB24: 12.5000 mg | ORAL_TABLET | Freq: Every day | ORAL | 1 refills | Status: DC

## 2019-12-12 ENCOUNTER — Telehealth: Payer: Self-pay | Admitting: Internal Medicine

## 2019-12-12 ENCOUNTER — Other Ambulatory Visit: Payer: Self-pay | Admitting: Internal Medicine

## 2019-12-12 MED ORDER — LOSARTAN POTASSIUM 50 MG PO TABS: 50.0000 mg | ORAL_TABLET | Freq: Every day | ORAL | 2 refills | Status: DC

## 2019-12-12 MED ORDER — METOPROLOL SUCCINATE ER 25 MG PO TB24: 12.5000 mg | ORAL_TABLET | Freq: Every day | ORAL | 2 refills | Status: DC

## 2019-12-12 NOTE — Telephone Encounter (Signed)
Spoke with pt who report she requested a new Rx on 7/12 for losartan and metoprolol but it was denied. Inform pt that a new Rx was sent yesterday to [facility]. Pt verbalized understanding.

## 2019-12-12 NOTE — Telephone Encounter (Signed)
Patient called and wanted to make sure her rx will not get denied. She states she called [facility] and the last  rx was denied by Dr. Debara Pickett. She is almost out of medication and wants to make sure her request is approved and that she does not run out of medication

## 2019-12-12 NOTE — Telephone Encounter (Signed)
*  STAT* If patient is at the pharmacy, call can be transferred to refill team.   1. Which medications need to be refilled? (please list name of each medication and dose if known)  losartan (COZAAR) 50 MG tablet metoprolol succinate (TOPROL-XL) 25 MG 24 hr tablet  2. Which pharmacy/location (including street and city if local pharmacy) is medication to be sent to? [facility] at [place], [place]  3. Do they need a 30 day or 90 day supply? 30 day   Patient is out of medication. She states her refills from [facility] will not get to her until 7/30.

## 2019-12-19 ENCOUNTER — Encounter: Payer: Self-pay | Admitting: Family Medicine

## 2020-02-25 ENCOUNTER — Telehealth: Payer: Self-pay | Admitting: Internal Medicine

## 2020-02-25 NOTE — Telephone Encounter (Signed)
STAT if HR is under 50 or over 120 (normal HR is 60-100 beats per minute)  1) What is your heart rate?from 92 to 108  2) Do you have a log of your heart rate readings (document readings)? Yes  3) Do you have any other symptoms? Numbness in hands, dorness in her back- wants to be seen  today

## 2020-02-25 NOTE — Progress Notes (Signed)
Cardiology Office Note:    Date:  02/26/2020   ID:  Alicia Raymond, DOB 04-May-1956, MRN [id number]  PCP:  Rita Ohara, MD  Cardiologist:  Pixie Casino, MD  Electrophysiologist:  None   Referring MD: Rita Ohara, MD   Chief Complaint: follow-up of palpitation  History of Present Illness:    Alicia Raymond is a 64 y.o. female with a history of apical hypertrophic cardiomyopathy, paroxysmal SVT, hypertension, rheumatoid arthritis, and lupus who is followed by Dr. Debara Pickett.  She has known apical hypertrophic cardiomyopathy. Last Echo in 12/2015 showed LVEF of 60-65% with normal wall motion, grade 1 diastolic dysfunction, mild MR, redundant MV chordae with mild chordal SAM. No LVOT gradient. At visit in 2018, patient complained of palpitation. Monitor showed sinus rhythm and sinus tachycardia but no arrhythmias. She was seen in the ED in 2019 with tachypalpitations and was noted to have SVT with rates in the 160's. She was started on Toprol-XL with improvement. Patient was last seen by Dr. Debara Pickett in 05/2019 at which time she reports good control of her palpitations with only occasional brief breakthroughs. No medication changes were felt to be necessary.   Patient called our office on 02/25/2020 with reports of palpitation the evening before while watching TV with associated shortness of breath as well as some back pain and weakness in arms and legs. Therefore, this appointment was made for further evaluation.  Patient reports episodes of palpitations for the last several months where she feels her heart beating "hard" and "fast" but no irregularly. These episodes usually occur at night but also have occurred during the day. They can last anywhere from 10 to 30 minutes. She had an episode of this on Monday night and had associated shortness of breath, lower arm soreness/numbness, and upper back pain. This episode lasted 10-15 minutes. She checked her heart rate on her watch and rate was 109 bpm.  No lightheadedness, dizziness, or near syncope. No chest pain. She denies any significant shortness of breath outside of these episodes but does describe some orthopnea and PND. No lower extremity edema. She does states she has arm soreness/numbness with other episodes of palpitations as well. She states her back still hurts a little bit and states it feels like there is a knot in her back but this has improved some.     Past Medical History:  Diagnosis Date  . Apical variant hypertrophic cardiomyopathy (HCC)    Dr. Debara Pickett  . Colon polyp   . 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 Hypertension    Dr. Debara Pickett  . Osteoarthritis    Dr. Estanislado Pandy (NOT rheumatoid)  . PSVT (paroxysmal supraventricular tachycardia) ([place])     Past Surgical History:  Procedure Laterality Date  . BREAST BIOPSY Right 1988   fibroadenoma (benign)  . CARDIAC CATHETERIZATION  04/12/2010   LAD with 30% narrowing - minimal CAD (Dr. Alma Friendly)  . TRANSTHORACIC ECHOCARDIOGRAM  03/2010   EF=>55%, apical hypertrophy, mod conc LVH; mild MR; trace TR with normal RVSP  . TUBAL LIGATION      Current Medications: Current Meds  Medication Sig  . b complex vitamins tablet Take 1 tablet by mouth daily.  Marland Kitchen CALCIUM PO Take 1 tablet by mouth daily.   . Cholecalciferol (VITAMIN D PO) Take 1 capsule by mouth daily.   . hydroxychloroquine (PLAQUENIL) 200 MG tablet Take 1 tablet (200 mg total) by mouth daily.  Marland Kitchen losartan (COZAAR) 50 MG tablet Take 1 tablet (50 mg total) by mouth  daily.  . metoprolol succinate (TOPROL-XL) 25 MG 24 hr tablet Take 1 tablet (25 mg total) by mouth daily.  . Omega-3 Fatty Acids (FISH OIL) 1000 MG CAPS Take 1 capsule by mouth daily.  . [DISCONTINUED] metoprolol succinate (TOPROL-XL) 25 MG 24 hr tablet Take 0.5 tablets (12.5 mg total) by mouth daily.     Allergies:   Septra [bactrim]   Social History   Socioeconomic History  . Marital status: Married    Spouse name: Not on file  . Number of children: 3  .  Years of education: Not on file  . Highest education level: Not on file  Occupational History  . Occupation: Patent attorney: [employer]  Tobacco Use  . Smoking status: Never Smoker  . Smokeless tobacco: Never Used  Vaping Use  . Vaping Use: Never used  Substance and Sexual Activity  . Alcohol use: Yes    Comment: RARELY  . Drug use: No  . Sexual activity: Yes    Partners: Male  Other Topics Concern  . Not on file  Social History Narrative   Married.  Lives with her husband.  Oldest daughter lives in [place], son and daughter lives in [place], [place].  5 grandchildren.  (lost 1 daughter at the age of 2.5 yo due to illness)   Social Determinants of Health   Financial Resource Strain:   . Difficulty of Paying Living Expenses: Not on file  Food Insecurity:   . Worried About Charity fundraiser in the Last Year: Not on file  . Ran Out of Food in the Last Year: Not on file  Transportation Needs:   . Lack of Transportation (Medical): Not on file  . Lack of Transportation (Non-Medical): Not on file  Physical Activity:   . Days of Exercise per Week: Not on file  . Minutes of Exercise per Session: Not on file  Stress:   . Feeling of Stress : Not on file  Social Connections:   . Frequency of Communication with Friends and Family: Not on file  . Frequency of Social Gatherings with Friends and Family: Not on file  . Attends Religious Services: Not on file  . Active Member of Clubs or Organizations: Not on file  . Attends Archivist Meetings: Not on file  . Marital Status: Not on file     Family History: The patient's family history includes COPD in her brother; Cancer in her father and sister; Cancer (age of onset: 1) in her sister; Hyperlipidemia in her brother and son; Hypertension in her mother; Lung cancer in her father; Other in her sister; Supraventricular tachycardia in her daughter. There is no history of Diabetes, Colon cancer, or Breast  cancer.  ROS:   Please see the history of present illness.     EKGs/Labs/Other Studies Reviewed:    The following studies were reviewed today:  Echocardiogram 01/19/2016: Study Conclusions: - Left ventricle: The cavity size was normal. Wall thickness was  normal. Systolic function was normal. The estimated ejection  fraction was in the range of 60% to 65%. Wall motion was normal;  there were no regional wall motion abnormalities. Doppler  parameters are consistent with abnormal left ventricular  relaxation (grade 1 diastolic dysfunction).  - Mitral valve: There was mild systolic anterior motion of the  chordal structures. There was mild regurgitation.  - Left atrium: The atrium was moderately dilated.   Impressions:  - Normal LV systolic function; probable mild diastolic  dysfunction;  mild MR; moderate LAE; redundant MV chordae with mild chordal  SAM; no LVOT gradient; findings not suggestive of HCM.  _______________  Elwyn Reach Monitor 04/22/2017 to 05/05/2017: Sinus rhythm and sinus tachycardia. No arrhythmias.  EKG:  EKG ordered today. EKG personally reviewed and demonstrates normal sinus rhythm, rate 71 bpm, with LVH and diffuse ST/T wave changes. There are biphasic T waves in inferior leads and mil ST depression and T wave inversions in leads V3-V5. These have been seen on prior tracings and are ay repolarization abnormalities from LVH. Normal axis. QTc 467 ms.  Recent Labs: 05/10/2019: ALT 24; BUN 12; Creatinine, Ser 0.79; Hemoglobin 14.8; Platelets 231; Potassium 4.9; Sodium 140  Recent Lipid Panel    Component Value Date/Time   CHOL 214 (H) 09/29/2016 0851   TRIG 68 09/29/2016 0851   HDL 102 09/29/2016 0851   CHOLHDL 2.1 09/29/2016 0851   VLDL 14 09/29/2016 0851   LDLCALC 98 09/29/2016 0851    Physical Exam:    Vital Signs: BP 116/74   Pulse 71   Ht 5\' 1"  (1.549 m)   Wt 172 lb (78 kg)   SpO2 97%   BMI 32.50 kg/m     Wt Readings from Last 3  Encounters:  02/26/20 172 lb (78 kg)  06/18/19 165 lb 3.2 oz (74.9 kg)  12/12/18 165 lb 6.4 oz (75 kg)     General: 64 y.o. female in no acute distress. HEENT: Normocephalic and atraumatic. Sclera clear. EOMs intact. Neck: Supple. No carotid bruits. No JVD. Heart: RRR. Distinct S1 and S2. No murmurs, gallops, or rubs.  Lungs: No increased work of breathing. Clear to ausculation bilaterally. No wheezes, rhonchi, or rales.  Abdomen: Soft, non-distended, and non-tender to palpation.  Extremities: No lower extremity edema.    Skin: Warm and dry. Neuro: Alert and oriented x3. No focal deficits. Psych: Normal affect. Responds appropriately.   Assessment:    1. Palpitations   2. Paroxysmal SVT (supraventricular tachycardia) (HCC)   3. Apical variant hypertrophic cardiomyopathy ([place])   4. Dyspnea, unspecified type   5. Primary hypertension   6. Rheumatoid arthritis, involving unspecified site, unspecified whether rheumatoid factor present ([place])   7. Lupus ([place])   8. Pain of upper extremity, unspecified laterality   9. Arm numbness     Plan:    Palpitations Paroxysmal SVT Patient reports multiple episodes of palpations of the last several months that last for 10 to 30 minutes at a time. Rates in the low 100's at the time. She does have a history of paroxysmal SVT but states this feels different. I do not thinks these episodes are recurrent SVT because this would be very slow for SVT but will order 2 week Zio monitor to evaluate for other arrhythmias such as atrial fibrillation. Will also check TSH, BMET, and Mg. Will increase Toprol-XL to 25mg  daily.  Apical Hypertrophic Cardiomyopathy Dyspnea Patient describes what sounds like orthopnea and PND as well as shortness of breath associated with palpitations. No other dyspnea. Last Echo in 2017 showed LVEF of 60-65% with normal wall motion, grade 1 diastolic dysfunction, mild MR, redundant MV chordae with mild chordal SAM. No LVOT gradient.  Will repeat Echo since she has not had one 4 years. Will also check BNP. She does not look volume overloaded at this time so will hold off on Lasix. I wondering if her nocturnal symptoms could actually be sleep apnea as she reports snoring and then waking up with a choking sensation. No  daytime somnolence. May consider sleep study at follow-up.  Hypertension BP well controlled. Continue Losartan 50mg  daily.  Will increase Toprol-XL to 50mg  daily given palpitations.  Rheumatoid Arthritis Lupus On Plaquenil. Followed by Rheumatology.  Arm Soreness/Numbness Patient describes bilateral lower arm soreness and numbness during episodes of palpitations. I don't know what to make of this. She denies any chest pain and no acute changes on EKG. Don't think this is an anginal equivalent. Will proceed with above work-up. She has follow-up with PCP soon and asked her to discuss this with them as well.   Disposition: Follow up in about 2 months with Dr. Debara Pickett.   Medication Adjustments/Labs and Tests Ordered: Current medicines are reviewed at length with the patient today.  Concerns regarding medicines are outlined above.  Orders Placed This Encounter  Procedures  . Basic metabolic panel  . Magnesium  . TSH  . Brain natriuretic peptide  . LONG TERM MONITOR (3-14 DAYS)  . EKG 12-Lead  . ECHOCARDIOGRAM COMPLETE   Meds ordered this encounter  Medications  . metoprolol succinate (TOPROL-XL) 25 MG 24 hr tablet    Sig: Take 1 tablet (25 mg total) by mouth daily.    Dispense:  30 tablet    Refill:  1    Patient Instructions  Medication Instructions:  Increase your metoprolol to 25 mg (one whole tablet) once a day. *If you need a refill on your cardiac medications before your next appointment, please call your pharmacy*   Lab Work: BMET, MAG,TSH,BNP TODAY-HERE IN OUR OFFICE If you have labs (blood work) drawn today and your tests are completely normal, you will receive your results only  by: Marland Kitchen MyChart Message (if you have MyChart) OR . A paper copy in the mail If you have any lab test that is abnormal or we need to change your treatment, we will call you to review the results.   Testing/Procedures: Your physician has requested that you have an echocardiogram within the next 4 weeks. Echocardiography is a painless test that uses sound waves to create images of your heart. It provides your doctor with information about the size and shape of your heart and how well your heart's chambers and valves are working. This procedure takes approximately one hour. There are no restrictions for this procedure. This test is performed at our [facility] office at [employer]. [place] Monitor Instructions   Your physician has requested you wear your ZIO patch monitor__14_____days.   This is a single patch monitor.  [employer] supplies one patch monitor per enrollment.  Additional stickers are not available.   Please do not apply patch if you will be having a Nuclear Stress Test, Echocardiogram, Cardiac CT, MRI, or Chest Xray during the time frame you would be wearing the monitor. The patch cannot be worn during these tests.  You cannot remove and re-apply the ZIO XT patch monitor.   Your ZIO patch monitor will be sent USPS Priority mail from [facility] directly to your home address. The monitor may also be mailed to a PO BOX if home delivery is not available.   It may take 3-5 days to receive your monitor after you have been enrolled.   Once you have received you monitor, please review enclosed instructions.  Your monitor has already been registered assigning a specific monitor serial # to you.   Applying the monitor   Shave hair from upper left chest.   Hold abrader disc by  orange tab.  Rub abrader in 40 strokes over left upper chest as indicated in your monitor instructions.   Clean area with 4 enclosed alcohol pads .  Use all pads to assure are is cleaned  thoroughly.  Let dry.   Apply patch as indicated in monitor instructions.  Patch will be place under collarbone on left side of chest with arrow pointing upward.   Rub patch adhesive wings for 2 minutes.Remove white label marked "1".  Remove white label marked "2".  Rub patch adhesive wings for 2 additional minutes.   While looking in a mirror, press and release button in center of patch.  A small green light will flash 3-4 times .  This will be your only indicator the monitor has been turned on.     Do not shower for the first 24 hours.  You may shower after the first 24 hours.   Press button if you feel a symptom. You will hear a small click.  Record Date, Time and Symptom in the Patient Log Book.   When you are ready to remove patch, follow instructions on last 2 pages of Patient Log Book.  Stick patch monitor onto last page of Patient Log Book.   Place Patient Log Book in [place] box.  Use locking tab on box and tape box closed securely.  The [employer] and [employer] has [employer] on it.  Please place in mailbox as soon as possible.  Your physician should have your test results approximately 7 days after the monitor has been mailed back to [facility].   Call [place] at [phone] if you have questions regarding your ZIO XT patch monitor.  Call them immediately if you see an orange light blinking on your monitor.   If your monitor falls off in less than 4 days contact our Monitor department at [phone].  If your monitor becomes loose or falls off after 4 days call [employer] at [phone] for suggestions on securing your monitor.     Follow-Up: At [facility], you and your health needs are our priority.  As part of our continuing mission to provide you with exceptional heart care, we have created designated Provider Care Teams.  These Care Teams include your primary Cardiologist (physician) and Advanced Practice Providers (APPs -  Physician Assistants and  Nurse Practitioners) who all work together to provide you with the care you need, when you need it.  We recommend signing up for the patient portal called "MyChart".  Sign up information is provided on this After Visit Summary.  MyChart is used to connect with patients for Virtual Visits (Telemedicine).  Patients are able to view lab/test results, encounter notes, upcoming appointments, etc.  Non-urgent messages can be sent to your provider as well.   To learn more about what you can do with MyChart, go to [email or web address].    Your next appointment:   2 month(s)  The format for your next appointment:   In Person or Virtual   Provider:   You may see Pixie Casino, MD or one of the following Advanced Practice Providers on your designated Care Team:    Sande Rives, PA-C    Other Instructions None     Signed, Eppie Gibson  02/26/2020 5:17 PM    [place]

## 2020-02-25 NOTE — Telephone Encounter (Signed)
Patient called into triage with complaints of palpitations. Patient states that last night she felt her heart racing while watching TV and looked at her watch and her HR was 92 which is elevated for her she states. She states that last night her HR ranged from 92-108. Patient states that during the time of the palpitations she felt like it was hard to take a deep breath. Patient states her back started hurting yesterday morning that she describes is a soreness all over her entire back. Patient complains of feeling tired and weak with most weakness in her arms and legs, but has been able to do light exercise. Patient unable to check blood pressure, but current HR is 78. Patient denies chest pain, lightheadedness/dizziness, swelling, and shortness of breath. Patient states that at this time she feels okay, with no palpitations since last night, but would like to be seen by a provider to discuss. 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 Patient added to schedule to see Sande Rives PA-C tomorrow at 3:45pm. Advised patient that if symptoms worsen to seek medical attention. Patient verbalized understanding and instructions.

## 2020-02-25 NOTE — Telephone Encounter (Signed)
Attempted to call patient , left message for patient to return call

## 2020-02-26 ENCOUNTER — Telehealth: Payer: Self-pay | Admitting: Radiology

## 2020-02-26 ENCOUNTER — Encounter: Payer: Self-pay | Admitting: Student

## 2020-02-26 ENCOUNTER — Ambulatory Visit (INDEPENDENT_AMBULATORY_CARE_PROVIDER_SITE_OTHER): Payer: Medicare Other | Admitting: Student

## 2020-02-26 ENCOUNTER — Other Ambulatory Visit: Payer: Self-pay

## 2020-02-26 VITALS — BP 116/74 | HR 71 | Ht 61.0 in | Wt 172.0 lb

## 2020-02-26 DIAGNOSIS — M329 Systemic lupus erythematosus, unspecified: Secondary | ICD-10-CM

## 2020-02-26 DIAGNOSIS — R0600 Dyspnea, unspecified: Secondary | ICD-10-CM | POA: Diagnosis not present

## 2020-02-26 DIAGNOSIS — I422 Other hypertrophic cardiomyopathy: Secondary | ICD-10-CM

## 2020-02-26 DIAGNOSIS — I471 Supraventricular tachycardia, unspecified: Secondary | ICD-10-CM

## 2020-02-26 DIAGNOSIS — M79603 Pain in arm, unspecified: Secondary | ICD-10-CM

## 2020-02-26 DIAGNOSIS — I10 Essential (primary) hypertension: Secondary | ICD-10-CM | POA: Diagnosis not present

## 2020-02-26 DIAGNOSIS — R002 Palpitations: Secondary | ICD-10-CM | POA: Diagnosis not present

## 2020-02-26 DIAGNOSIS — IMO0002 Reserved for concepts with insufficient information to code with codable children: Secondary | ICD-10-CM

## 2020-02-26 DIAGNOSIS — R200 Anesthesia of skin: Secondary | ICD-10-CM

## 2020-02-26 DIAGNOSIS — M069 Rheumatoid arthritis, unspecified: Secondary | ICD-10-CM

## 2020-02-26 DIAGNOSIS — Z79899 Other long term (current) drug therapy: Secondary | ICD-10-CM | POA: Diagnosis not present

## 2020-02-26 MED ORDER — METOPROLOL SUCCINATE ER 25 MG PO TB24
25.0000 mg | ORAL_TABLET | Freq: Every day | ORAL | 1 refills | Status: DC
Start: 2020-02-26 — End: 2020-04-10

## 2020-02-26 NOTE — Progress Notes (Signed)
Chief Complaint  Patient presents with  . Medicare Wellness    fasting Welcome to Medicare visit. Only issues are her right knee and left foot. Gave me wrong of flu shot, she checked with HR yesterday and she actually had 02/13/20-she would like to have COVID booster today if possible. Her husband is also a patient here (sees [place]) and would like his as well.    Alicia Raymond is a 64 y.o. female who presents for Welcome to Medicare Physical, and follow-up on chronic medical conditions.    She is under the care of Dr. Debara Pickett for apical variant hypertrophic cardiomyopathy, mild hypertension and SVT. Toprol XL (12.61m) was added after an episode of SVT (ER visit 12/2017). She saw cardiology again this week, related to episodes of tachycardia. Has episodes where heart is beating hard and fast, with associated shortness of breath, forearm muscle soreness/numbness, shoulder soreness/tightness (relieved by massage by her husband's elbow).  She also described some orthopnea and PND.  They ordered Zio monitor, and a repeat echocardiogram, as well as labs (see below).  They increased her Toprol XL dose to 273m and continued the same losartan dose. Consideration for sleep study (if echo normal, no other reason for PND).  She denies any chest pain, lightheadedness, headaches, dizziness, syncope, edema or any side effects to medications. Currently she feels good, no arm or back pain.  Arthritisand discoidlupus: She previously was under the care of Dr. DeEstanislado Pandyfor OA and discoid lupus.  She is doing well on Plaquenil. She last saw her in January for a telehealth visit, with last labs in 04/2019.   She changed care to Dr. HaTrudie Reedn May--no records available.  She is maintained on the same medications.  She has right knee and left foot pain which is being addressed by rheum. She has some discomfort and pain, sometimes numbness, at the lateral upper portion of her left foot (related to shoes, better with wider  shoes). She recalls having traumatic injuries twice to the left foot, and is frustrating with the discomfort and numbness. Walking is limited due to knee and foot pain.  She misses walking, she can only do 20-30 minutes of walking at a time now.  Skin is well controlled--discoid lupus becomes symptomatic if she is in the sun, so she exercises indoors. Denies any rashes.    Immunization History  Administered Date(s) Administered  . Fluad Quad(high Dose 65+) 01/16/2019  . Influenza Inj Mdck Quad Pf 02/09/2018  . Influenza Split 04/03/2012, 02/08/2013, 02/12/2014  . Influenza, High Dose Seasonal PF 02/13/2020  . Influenza-Unspecified 02/13/2015, 02/18/2016, 02/16/2017  . PFIZER SARS-COV-2 Vaccination 07/13/2019, 08/07/2019  . Pneumococcal Conjugate-13 12/12/2018  . Pneumococcal Polysaccharide-23 02/08/2013  . Tdap 02/12/2014  . Zoster 08/26/2014  . Zoster Recombinat (Shingrix) 02/16/2017, 06/23/2017   Last Pap smear: 09/2017, normal, no high risk HPV Last mammogram:11/2019 Last colonoscopy:04/2014--tubular adenoma. Recommended repeat 5 years (hasn't heard from GI's office) Last DEXA: 11/2016: T-1.7 spine (prev5/2016 T-1.6spine,12/2012 T-1.3) She reports she scheduled this, but they didn't get an order, that doctor denied it saying it was too early, so it was cancelled.. Dentist: twice yearly Ophtho: yearly Exercise: She is back in her office since July 2021.  She is walking around the building for 20 minutes, 5x/week, on treadmill at home x 20 minutes on the weekends.  Weights at home/work (daily, upper and lower body).  Still does yoga stretches daily.  Vitamin D-OH [id number]: Lab Results  Component Value Date   CHOL 214 (  H) 09/29/2016   HDL 102 09/29/2016   LDLCALC 98 09/29/2016   TRIG 68 09/29/2016   CHOLHDL 2.1 09/29/2016   Thyroid screen: Lab Results  Component Value Date   TSH 1.270 01/24/2018    Other doctors caring for patient include:  Dr.  Estanislado Pandy-- Rheum (no longer sees) Dr. Philis Pique Dr. Hilty--cardiology [facility]--dentist Dr. Felix Ahmadi at [facility] Dr. Allene Pyo  Fall screen negative Depression screen negative Functional Status Survey: notable for ringing in ears, dry eyes, knee and foot pain affecting walking Mini-Cog screen normal. See full screens in Epic.  End of Life Discussion:  Patient does not have a living will and medical power of attorney   PMH, PSH, SH and FH were reviewed and updated  Outpatient Encounter Medications as of 02/27/2020  Medication Sig Note  . b complex vitamins tablet Take 1 tablet by mouth daily.   Marland Kitchen CALCIUM PO Take 1 tablet by mouth daily.    . Cholecalciferol (VITAMIN D PO) Take 1 capsule by mouth daily.  02/27/2020: 1000 IU  . hydroxychloroquine (PLAQUENIL) 200 MG tablet Take 1 tablet (200 mg total) by mouth daily.   Marland Kitchen losartan (COZAAR) 50 MG tablet Take 1 tablet (50 mg total) by mouth daily.   . metoprolol succinate (TOPROL-XL) 25 MG 24 hr tablet Take 1 tablet (25 mg total) by mouth daily.   . Omega-3 Fatty Acids (FISH OIL) 1000 MG CAPS Take 1 capsule by mouth daily.   . [DISCONTINUED] ASPIRIN 81 PO Take by mouth as needed.   . [DISCONTINUED] metoprolol succinate (TOPROL-XL) 25 MG 24 hr tablet Take 0.5 tablets (12.5 mg total) by mouth daily.    No facility-administered encounter medications on file as of 02/27/2020.   Allergies  Allergen Reactions  . Septra [Bactrim] Swelling    Couldn't breathe, lip swelling    ROS: The patient denies anorexia, fever, vision changes, decreased hearing, ear pain, sore throat, breast concerns, chest pain, dizziness, syncope, dyspnea on exertion, cough, swelling, nausea, vomiting, diarrhea, constipation, abdominal pain, melena, hematochezia, indigestion/heartburn, hematuria, incontinence, dysuria, postmenopausal bleeding, vaginal discharge, odor or itch, genital lesions, weakness, tremor, suspicious skin lesions,  abnormal bleeding/bruising, or enlarged lymph nodes. Ringing in her right ear (not new); denies hearing loss. Decreased libido, some vaginal dryness and discomfort. No longer sexually active, and she and husband are okay with this situation. Knee and foot pain per HPI. Palpitations, arm/shoulder pain during episodes of tachcardia, and orthopnea per HPI.   PHYSICAL EXAM:  BP 110/80   Pulse 68   Ht 5' 2.5" (1.588 m)   Wt 170 lb 6.4 oz (77.3 kg)   BMI 30.67 kg/m   Wt Readings from Last 3 Encounters:  02/27/20 170 lb 6.4 oz (77.3 kg)  02/26/20 172 lb (78 kg)  06/18/19 165 lb 3.2 oz (74.9 kg)    General Appearance:   Alert, cooperative, no distress, appears stated age  Head:   Normocephalic, without obvious abnormality, atraumatic  Eyes:   PERRL, conjunctiva/corneas clear, EOM's intact, fundi benign  Ears:   Normal TM's and external ear canals  Nose:  Not examined (wearing mask due to COVID-19 pandemic)  Throat:  Not examined (wearing mask due to COVID-19 pandemic)  Neck:  Supple, no lymphadenopathy; thyroid: noenlargement/ tenderness/nodules; no carotidbruit or JVD  Back:  Spine nontender, no curvature, ROM normal, no CVAtenderness  Lungs:   Clear to auscultation bilaterally without wheezes, rales or ronchi; respirations unlabored  Chest Wall:   No tenderness or deformity  Heart:  Regular rate and rhythm, S1 and S2 normal, no murmur, rub or gallop  Breast Exam:   No tenderness, masses, or nipple discharge or inversion.No axillary lymphadenopathy. WHSS right upper breast  Abdomen:   Soft, non-tender, nondistended, normoactive bowel sounds, no masses, no hepatosplenomegaly  Genitalia:   Normal external genitalia without lesions. BUS and vagina normal, some atrophic changes; no cervical motion tenderness. No abnormal vaginal discharge. Uterus and adnexa not enlarged, nontender, no masses. Pap not performed  Rectal:   Normal tone,  no masses or tenderness; heme negative stool  Extremities:  No clubbing, cyanosis or edema. Area of discomfort is at mid-distal left 4th and 5th metatarsals. nontender to palpation, no palpable neuroma or tingling with compression.  Pulses:  1+ and symmetric all extremities  Skin:  Skin color, texture, turgor normal, no rashes or lesions.  Lymph nodes:  Cervical, supraclavicular, and axillary nodes normal  Neurologic:  Normal strength, gait; reflexes 2+ and symmetric throughout.    Psych: Normal mood, affect, hygiene and grooming  Urine dip: SG 1.025, normal  Labs from cardiologist yesterday: BNP is still pending Mg slightly elevated at 2.4 b-met--glu 93, BUN/Cr 21/1.16, rest normal  Lab Results  Component Value Date   TSH 1.660 02/26/2020    ASSESSMENT/PLAN:  Welcome to Medicare preventive visit - Plan: POCT Urinalysis DIP (Proadvantage Device), Lipid panel  Apical variant hypertrophic cardiomyopathy ([place]) - echo pending.  no e/o CHF on exam.   - Plan: Lipid panel  PSVT (paroxysmal supraventricular tachycardia) ([place]) - undergoing eval by cardiology (echo scheduled, Zio monitor planned). 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  Advised to contact Dr. Ulyses Amor office to schedule  Screening for lipid disorders - Plan: Lipid panel  Need for viral immunization - Plan: Pfizer SARS-COV-2 Vaccine  Left foot pain - ddx reviewed. Consider podiatry evaluation (may benefit from orthotics, vs injection or other treatment)  Postmenopause atrophic vaginitis - causing pain, contributing to decreased libido. Discussed treatment options (topical estrogen) but patient not interested at this time  Faxed request to [facility] for DEXA; pt will call to  schedule.   Discussed monthly self breast exams and yearly mammograms; at least 30 minutes of aerobic activity at least 5 days/week, weight-bearing exercise at least 2x/wk; proper sunscreen use reviewed; healthy diet, including goals of calcium and vitamin D intake and alcohol recommendations (less than or equal to 1 drink/day) reviewed; regular seatbelt use; changing batteries in smoke detectors. Colonoscopy was due 04/2019. Pt to contact Dr. Ulyses Amor office to schedule.   Immunizations--continue yearly flu shots. [place] booster given today. Return in 2 weeks for NV for Pneumovax. Repeat DEXA recommended, pt to contact [facility] to schedule, order faxed.  Discussed living will, healthcare power of attorney, and advised to get [place] copy of notarized forms to be scanned into her chart.  Forms given.  MOST form completed, full code, full care  F/u 1 year, sooner prn.   Medicare Attestation I have personally reviewed: The patient's medical and social history Their use of alcohol, tobacco or illicit drugs Their current medications and supplements The patient's functional ability including ADLs,fall risks, home safety risks, cognitive, and hearing and visual impairment Diet and physical activities Evidence for depression or mood disorders  The patient's weight, height, BMI have been recorded in the chart.  I  have made referrals, counseling, and provided education to the patient based on review of the above and I have provided the patient with a written personalized care plan for preventive services.

## 2020-02-26 NOTE — Patient Instructions (Addendum)
  HEALTH MAINTENANCE RECOMMENDATIONS:  It is recommended that you get at least 30 minutes of aerobic exercise at least 5 days/week (for weight loss, you may need as much as 60-90 minutes). This can be any activity that gets your heart rate up. This can be divided in 10-15 minute intervals if needed, but try and build up your endurance at least once a week.  Weight bearing exercise is also recommended twice weekly.  Eat a healthy diet with lots of vegetables, fruits and fiber.  "Colorful" foods have a lot of vitamins (ie green vegetables, tomatoes, red peppers, etc).  Limit sweet tea, regular sodas and alcoholic beverages, all of which has a lot of calories and sugar.  Up to 1 alcoholic drink daily may be beneficial for women (unless trying to lose weight, watch sugars).  Drink a lot of water.  Calcium recommendations are 1200-1500 mg daily (1500 mg for postmenopausal women or women without ovaries), and vitamin D 1000 IU daily.  This should be obtained from diet and/or supplements (vitamins), and calcium should not be taken all at once, but in divided doses.  Monthly self breast exams and yearly mammograms for women over the age of 35 is recommended.  Sunscreen of at least SPF 30 should be used on all sun-exposed parts of the skin when outside between the hours of 10 am and 4 pm (not just when at beach or pool, but even with exercise, golf, tennis, and yard work!)  Use a sunscreen that says "broad spectrum" so it covers both UVA and UVB rays, and make sure to reapply every 1-2 hours.  Remember to change the batteries in your smoke detectors when changing your clock times in the spring and fall.  Use your seat belt every time you are in a car, and please drive safely and not be distracted with cell phones and texting while driving.    Alicia Raymond , Thank you for taking time to come for your Welcome to Medicare Visit. I appreciate your ongoing commitment to your health goals. Please review the  following plan we discussed and let me know if I can assist you in the future.   This is a list of the screening recommended for you and due dates:  Health Maintenance  Topic Date Due  . Pneumonia vaccines (2 of 2 - PPSV23) 12/12/2019  . Colon Cancer Screening  04/02/2020  . Mammogram  11/29/2020  . Tetanus Vaccine  02/13/2024  . Flu Shot  Completed  . DEXA scan (bone density measurement)  Completed  . COVID-19 Vaccine  Completed  .  Hepatitis C: One time screening is recommended by Center for Disease Control  (CDC) for  adults born from [birth year] through 1967.   Completed   I believe the colonoscopy date above is wrong--I believe you last had colonoscopy in 04/2014, not in 03/2015.  You were due for a 5 year follow-up.  Please contact Dr. Ulyses Amor office to schedule this.  I recommend a repeat bone density test, to follow-up on the thinning noted on prior studies (osteopenia). You can contact [facility] to schedule this, we will send a referral.  Today we are giving you the COVID booster. You are also due for the pneumovax--return for this in 2 weeks (or longer) as a nurse visit.  Consider seeing podiatrist for your ongoing issues with your left foot pain and tingling.

## 2020-02-26 NOTE — Patient Instructions (Addendum)
Medication Instructions:  Increase your metoprolol to 25 mg (one whole tablet) once a day. *If you need a refill on your cardiac medications before your next appointment, please call your pharmacy*   Lab Work: BMET, MAG,TSH,BNP TODAY-HERE IN OUR OFFICE If you have labs (blood work) drawn today and your tests are completely normal, you will receive your results only by: Marland Kitchen MyChart Message (if you have MyChart) OR . A paper copy in the mail If you have any lab test that is abnormal or we need to change your treatment, we will call you to review the results.   Testing/Procedures: Your physician has requested that you have an echocardiogram within the next 4 weeks. Echocardiography is a painless test that uses sound waves to create images of your heart. It provides your doctor with information about the size and shape of your heart and how well your heart's chambers and valves are working. This procedure takes approximately one hour. There are no restrictions for this procedure. This test is performed at our [facility] office at [employer]. [place] Monitor Instructions   Your physician has requested you wear your ZIO patch monitor__14_____days.   This is a single patch monitor.  [employer] supplies one patch monitor per enrollment.  Additional stickers are not available.   Please do not apply patch if you will be having a Nuclear Stress Test, Echocardiogram, Cardiac CT, MRI, or Chest Xray during the time frame you would be wearing the monitor. The patch cannot be worn during these tests.  You cannot remove and re-apply the ZIO XT patch monitor.   Your ZIO patch monitor will be sent USPS Priority mail from [facility] directly to your home address. The monitor may also be mailed to a PO BOX if home delivery is not available.   It may take 3-5 days to receive your monitor after you have been enrolled.   Once you have received you monitor, please review enclosed  instructions.  Your monitor has already been registered assigning a specific monitor serial # to you.   Applying the monitor   Shave hair from upper left chest.   Hold abrader disc by orange tab.  Rub abrader in 40 strokes over left upper chest as indicated in your monitor instructions.   Clean area with 4 enclosed alcohol pads .  Use all pads to assure are is cleaned thoroughly.  Let dry.   Apply patch as indicated in monitor instructions.  Patch will be place under collarbone on left side of chest with arrow pointing upward.   Rub patch adhesive wings for 2 minutes.Remove white label marked "1".  Remove white label marked "2".  Rub patch adhesive wings for 2 additional minutes.   While looking in a mirror, press and release button in center of patch.  A small green light will flash 3-4 times .  This will be your only indicator the monitor has been turned on.     Do not shower for the first 24 hours.  You may shower after the first 24 hours.   Press button if you feel a symptom. You will hear a small click.  Record Date, Time and Symptom in the Patient Log Book.   When you are ready to remove patch, follow instructions on last 2 pages of Patient Log Book.  Stick patch monitor onto last page of Patient Log Book.   Place Patient Log Book in [place] box.  Use locking  tab on box and tape box closed securely.  The [employer] and [employer] has [employer] on it.  Please place in mailbox as soon as possible.  Your physician should have your test results approximately 7 days after the monitor has been mailed back to [facility].   Call [place] at [phone] if you have questions regarding your ZIO XT patch monitor.  Call them immediately if you see an orange light blinking on your monitor.   If your monitor falls off in less than 4 days contact our Monitor department at [phone].  If your monitor becomes loose or falls off after 4 days call [employer] at [phone] for  suggestions on securing your monitor.     Follow-Up: At [facility], you and your health needs are our priority.  As part of our continuing mission to provide you with exceptional heart care, we have created designated Provider Care Teams.  These Care Teams include your primary Cardiologist (physician) and Advanced Practice Providers (APPs -  Physician Assistants and Nurse Practitioners) who all work together to provide you with the care you need, when you need it.  We recommend signing up for the patient portal called "MyChart".  Sign up information is provided on this After Visit Summary.  MyChart is used to connect with patients for Virtual Visits (Telemedicine).  Patients are able to view lab/test results, encounter notes, upcoming appointments, etc.  Non-urgent messages can be sent to your provider as well.   To learn more about what you can do with MyChart, go to [email or web address].    Your next appointment:   2 month(s)  The format for your next appointment:   In Person or Virtual   Provider:   You may see Pixie Casino, MD or one of the following Advanced Practice Providers on your designated Care Team:    Sande Rives, [place]    Other Instructions None

## 2020-02-26 NOTE — Telephone Encounter (Signed)
Enrolled patient for a 14 day Zio XT  monitor to be mailed to patients home  °

## 2020-02-27 ENCOUNTER — Ambulatory Visit (INDEPENDENT_AMBULATORY_CARE_PROVIDER_SITE_OTHER): Payer: Medicare Other | Admitting: Family Medicine

## 2020-02-27 ENCOUNTER — Telehealth: Payer: Self-pay | Admitting: Student

## 2020-02-27 ENCOUNTER — Encounter: Payer: Self-pay | Admitting: Family Medicine

## 2020-02-27 VITALS — BP 110/80 | HR 68 | Ht 62.5 in | Wt 170.4 lb

## 2020-02-27 DIAGNOSIS — I10 Essential (primary) hypertension: Secondary | ICD-10-CM

## 2020-02-27 DIAGNOSIS — N952 Postmenopausal atrophic vaginitis: Secondary | ICD-10-CM

## 2020-02-27 DIAGNOSIS — L930 Discoid lupus erythematosus: Secondary | ICD-10-CM | POA: Diagnosis not present

## 2020-02-27 DIAGNOSIS — I471 Supraventricular tachycardia, unspecified: Secondary | ICD-10-CM

## 2020-02-27 DIAGNOSIS — M79672 Pain in left foot: Secondary | ICD-10-CM

## 2020-02-27 DIAGNOSIS — I422 Other hypertrophic cardiomyopathy: Secondary | ICD-10-CM

## 2020-02-27 DIAGNOSIS — D126 Benign neoplasm of colon, unspecified: Secondary | ICD-10-CM

## 2020-02-27 DIAGNOSIS — Z23 Encounter for immunization: Secondary | ICD-10-CM | POA: Diagnosis not present

## 2020-02-27 DIAGNOSIS — M8588 Other specified disorders of bone density and structure, other site: Secondary | ICD-10-CM | POA: Diagnosis not present

## 2020-02-27 DIAGNOSIS — Z13220 Encounter for screening for lipoid disorders: Secondary | ICD-10-CM | POA: Diagnosis not present

## 2020-02-27 DIAGNOSIS — Z0000 Encounter for general adult medical examination without abnormal findings: Secondary | ICD-10-CM

## 2020-02-27 LAB — BASIC METABOLIC PANEL
BUN/Creatinine Ratio: 18 (ref 12–28)
BUN: 21 mg/dL (ref 8–27)
CO2: 20 mmol/L (ref 20–29)
Calcium: 9.2 mg/dL (ref 8.7–10.3)
Chloride: 102 mmol/L (ref 96–106)
Creatinine, Ser: 1.16 mg/dL — ABNORMAL HIGH (ref 0.57–1.00)
GFR calc Af Amer: 57 mL/min/{1.73_m2} — ABNORMAL LOW (ref >59)
GFR calc non Af Amer: 49 mL/min/{1.73_m2} — ABNORMAL LOW (ref >59)
Glucose: 93 mg/dL (ref 65–99)
Potassium: 4.8 mmol/L (ref 3.5–5.2)
Sodium: 138 mmol/L (ref 134–144)

## 2020-02-27 LAB — POCT URINALYSIS DIP (PROADVANTAGE DEVICE)
Bilirubin, UA: NEGATIVE
Blood, UA: NEGATIVE
Glucose, UA: NEGATIVE mg/dL
Ketones, POC UA: NEGATIVE mg/dL
Leukocytes, UA: NEGATIVE
Nitrite, UA: NEGATIVE
Protein Ur, POC: NEGATIVE mg/dL
Specific Gravity, Urine: 1.025
Urobilinogen, Ur: NEGATIVE
pH, UA: 6 (ref 5.0–8.0)

## 2020-02-27 LAB — MAGNESIUM: Magnesium: 2.4 mg/dL — ABNORMAL HIGH (ref 1.6–2.3)

## 2020-02-27 LAB — TSH: TSH: 1.66 u[IU]/mL (ref 0.450–4.500)

## 2020-02-27 LAB — BRAIN NATRIURETIC PEPTIDE: BNP: 148.2 pg/mL — ABNORMAL HIGH (ref 0.0–100.0)

## 2020-02-27 MED ORDER — FUROSEMIDE 40 MG PO TABS: ORAL_TABLET | ORAL | 0 refills | Status: DC

## 2020-02-27 NOTE — Telephone Encounter (Signed)
Spoke to patient recent lab results given.Advised to take Lasix 40 mg daily for 3 days only.Repeat bmet in one week.Advised to weigh daily and call office if she gains 3 lbs over night or 5 lbs in one week.

## 2020-02-27 NOTE — Telephone Encounter (Signed)
° °  Pt is returning call to get lab result °

## 2020-02-28 ENCOUNTER — Other Ambulatory Visit (INDEPENDENT_AMBULATORY_CARE_PROVIDER_SITE_OTHER): Payer: Medicare Other

## 2020-02-28 DIAGNOSIS — R002 Palpitations: Secondary | ICD-10-CM

## 2020-02-28 LAB — LIPID PANEL
Chol/HDL Ratio: 2.7 ratio (ref 0.0–4.4)
Cholesterol, Total: 245 mg/dL — ABNORMAL HIGH (ref 100–199)
HDL: 90 mg/dL (ref >39)
LDL Chol Calc (NIH): 145 mg/dL — ABNORMAL HIGH (ref 0–99)
Triglycerides: 60 mg/dL (ref 0–149)
VLDL Cholesterol Cal: 10 mg/dL (ref 5–40)

## 2020-03-06 DIAGNOSIS — I10 Essential (primary) hypertension: Secondary | ICD-10-CM | POA: Diagnosis not present

## 2020-03-06 LAB — BASIC METABOLIC PANEL
BUN/Creatinine Ratio: 15 (ref 12–28)
BUN: 15 mg/dL (ref 8–27)
CO2: 26 mmol/L (ref 20–29)
Calcium: 9.7 mg/dL (ref 8.7–10.3)
Chloride: 103 mmol/L (ref 96–106)
Creatinine, Ser: 1 mg/dL (ref 0.57–1.00)
GFR calc Af Amer: 68 mL/min/{1.73_m2} (ref >59)
GFR calc non Af Amer: 59 mL/min/{1.73_m2} — ABNORMAL LOW (ref >59)
Glucose: 75 mg/dL (ref 65–99)
Potassium: 5.4 mmol/L — ABNORMAL HIGH (ref 3.5–5.2)
Sodium: 141 mmol/L (ref 134–144)

## 2020-03-09 ENCOUNTER — Other Ambulatory Visit: Payer: Self-pay

## 2020-03-09 DIAGNOSIS — I10 Essential (primary) hypertension: Secondary | ICD-10-CM

## 2020-03-11 ENCOUNTER — Other Ambulatory Visit: Payer: Self-pay

## 2020-03-11 DIAGNOSIS — I10 Essential (primary) hypertension: Secondary | ICD-10-CM

## 2020-03-12 ENCOUNTER — Other Ambulatory Visit: Payer: Self-pay

## 2020-03-12 ENCOUNTER — Ambulatory Visit (HOSPITAL_COMMUNITY): Payer: Medicare Other | Attending: Cardiology

## 2020-03-12 DIAGNOSIS — I422 Other hypertrophic cardiomyopathy: Secondary | ICD-10-CM | POA: Diagnosis not present

## 2020-03-12 DIAGNOSIS — R002 Palpitations: Secondary | ICD-10-CM | POA: Diagnosis not present

## 2020-03-12 LAB — ECHOCARDIOGRAM COMPLETE
Area-P 1/2: 3.31 cm2
S' Lateral: 3 cm

## 2020-03-12 MED ORDER — PERFLUTREN LIPID MICROSPHERE
1.0000 mL | INTRAVENOUS | Status: AC | PRN
  Administered 2020-03-12: 2 mL via INTRAVENOUS

## 2020-03-13 ENCOUNTER — Telehealth: Payer: Self-pay | Admitting: Family Medicine

## 2020-03-13 NOTE — Telephone Encounter (Signed)
Requested records received form [facility]

## 2020-03-16 DIAGNOSIS — I10 Essential (primary) hypertension: Secondary | ICD-10-CM | POA: Diagnosis not present

## 2020-03-17 LAB — BASIC METABOLIC PANEL
BUN/Creatinine Ratio: 17 (ref 12–28)
BUN: 13 mg/dL (ref 8–27)
CO2: 25 mmol/L (ref 20–29)
Calcium: 9.5 mg/dL (ref 8.7–10.3)
Chloride: 106 mmol/L (ref 96–106)
Creatinine, Ser: 0.75 mg/dL (ref 0.57–1.00)
GFR calc Af Amer: 96 mL/min/{1.73_m2} (ref >59)
GFR calc non Af Amer: 83 mL/min/{1.73_m2} (ref >59)
Glucose: 78 mg/dL (ref 65–99)
Potassium: 5 mmol/L (ref 3.5–5.2)
Sodium: 143 mmol/L (ref 134–144)

## 2020-03-18 ENCOUNTER — Encounter: Payer: Self-pay | Admitting: Family Medicine

## 2020-03-18 DIAGNOSIS — R002 Palpitations: Secondary | ICD-10-CM | POA: Diagnosis not present

## 2020-03-19 ENCOUNTER — Telehealth: Payer: Self-pay | Admitting: Family Medicine

## 2020-03-19 NOTE — Telephone Encounter (Signed)
Pt called and states that [er Mychart she needs a pneumonia shot. Please advise pt at [phone].

## 2020-03-19 NOTE — Telephone Encounter (Signed)
We had discussed at her recent visit that she was due, but got COVID booster at that visit.  She was supposed to make a NV for pneumovax for 2 weeks later.  Please schedule for pneumovax.  Thanks

## 2020-03-19 NOTE — Telephone Encounter (Signed)
Pt called an appt was made

## 2020-03-20 ENCOUNTER — Other Ambulatory Visit (INDEPENDENT_AMBULATORY_CARE_PROVIDER_SITE_OTHER): Payer: Medicare Other

## 2020-03-20 ENCOUNTER — Other Ambulatory Visit: Payer: Self-pay

## 2020-03-20 DIAGNOSIS — Z23 Encounter for immunization: Secondary | ICD-10-CM

## 2020-03-24 ENCOUNTER — Other Ambulatory Visit: Payer: Self-pay | Admitting: Student

## 2020-03-30 DIAGNOSIS — M150 Primary generalized (osteo)arthritis: Secondary | ICD-10-CM | POA: Diagnosis not present

## 2020-03-30 DIAGNOSIS — L930 Discoid lupus erythematosus: Secondary | ICD-10-CM | POA: Diagnosis not present

## 2020-03-30 DIAGNOSIS — M2550 Pain in unspecified joint: Secondary | ICD-10-CM | POA: Diagnosis not present

## 2020-03-30 DIAGNOSIS — Z79899 Other long term (current) drug therapy: Secondary | ICD-10-CM | POA: Diagnosis not present

## 2020-04-09 ENCOUNTER — Other Ambulatory Visit: Payer: Self-pay | Admitting: Student

## 2020-04-10 NOTE — Telephone Encounter (Signed)
This is Dr. Hilty's pt 

## 2020-04-29 ENCOUNTER — Ambulatory Visit: Payer: Medicare Other | Admitting: Internal Medicine

## 2020-05-07 DIAGNOSIS — Z79899 Other long term (current) drug therapy: Secondary | ICD-10-CM | POA: Diagnosis not present

## 2020-05-13 ENCOUNTER — Other Ambulatory Visit: Payer: Self-pay | Admitting: Internal Medicine

## 2020-05-27 ENCOUNTER — Other Ambulatory Visit: Payer: Self-pay

## 2020-05-27 ENCOUNTER — Encounter: Payer: Self-pay | Admitting: Family Medicine

## 2020-05-27 ENCOUNTER — Telehealth (INDEPENDENT_AMBULATORY_CARE_PROVIDER_SITE_OTHER): Payer: Medicare Other | Admitting: Family Medicine

## 2020-05-27 VITALS — Temp 97.5°F | Wt 170.0 lb

## 2020-05-27 DIAGNOSIS — J209 Acute bronchitis, unspecified: Secondary | ICD-10-CM | POA: Diagnosis not present

## 2020-05-27 MED ORDER — AMOXICILLIN 875 MG PO TABS: 875.0000 mg | ORAL_TABLET | Freq: Two times a day (BID) | ORAL | 0 refills | Status: DC

## 2020-05-27 NOTE — Progress Notes (Signed)
   Subjective:    Patient ID: Alicia Raymond, female    DOB: 12-22-1955, 65 y.o.   MRN: [id number]  HPI I connected with  Lariya S Butsch on 05/27/20 by a video enabled telemedicine application and verified that I am speaking with the correct person using two identifiers.  Caregility used.  I am in my office.  Patient at home. I discussed the limitations of evaluation and management by telemedicine. The patient expressed understanding and agreed to proceed. She complains of a 2-week history of cough that has now become productive with slight sore throat but no fever, chills, headache, shortness of breath.  She did have a negative Covid test.  She is also had her vaccines.  She has been using Mucinex.   Review of Systems     Objective:   Physical Exam Alert and in no distress and does not appear to be toxic or tachypneic      Assessment & Plan:  Acute bronchitis, unspecified organism Amoxil was called in.  Also recommend she use Delsym during the day and NyQuil at night.  She was comfortable with that. 20 minutes spent today reviewing her chart, history and documentation as well as counseling and coordination of care.

## 2020-07-16 ENCOUNTER — Other Ambulatory Visit: Payer: Self-pay

## 2020-07-16 ENCOUNTER — Encounter: Payer: Self-pay | Admitting: Internal Medicine

## 2020-07-16 ENCOUNTER — Ambulatory Visit: Payer: Medicare Other | Admitting: Internal Medicine

## 2020-07-16 VITALS — BP 120/80 | HR 72 | Ht 62.0 in | Wt 175.0 lb

## 2020-07-16 DIAGNOSIS — Z79899 Other long term (current) drug therapy: Secondary | ICD-10-CM

## 2020-07-16 DIAGNOSIS — I471 Supraventricular tachycardia: Secondary | ICD-10-CM

## 2020-07-16 DIAGNOSIS — M549 Dorsalgia, unspecified: Secondary | ICD-10-CM | POA: Diagnosis not present

## 2020-07-16 MED ORDER — CYCLOBENZAPRINE HCL 5 MG PO TABS: 5.0000 mg | ORAL_TABLET | Freq: Every evening | ORAL | 1 refills | Status: DC | PRN

## 2020-07-16 NOTE — Progress Notes (Signed)
OFFICE NOTE  Chief Complaint:  Routine followup  Primary Care Physician: Rita Ohara, MD  HPI:  Alicia Raymond is a 65 year old female who has a history of apical hypertrophic cardiomyopathy as well as mild hypertension, rheumatoid arthritis and lupus. Overall, those connective tissue disorders have been very well controlled and she has been on losartan, which is hopefully going to be helpful with providing her some prevention from apical hypertrophy. Over the past year, she has stopped exercising, and gained a small amount of weight. She has had a couple of episodes of chest discomfort at night over the past year however they have subsided.  I had the pleasure of seeing Alicia Raymond back in the office today. Overall she's done very well over the past year. She started doing significant exercise almost every morning. Her weight is down now more than 15 pounds to 153. Blood pressure is well controlled at 118/70. She remains on losartan, mostly for apical hypertrophic cardiomyopathy. She's had no chest pain or worsening shortness of breath. She reports her rheumatoid arthritis is better controlled in fact she is on a lower dose of Plaquenil.  01/08/2016  Alicia Raymond returns today for follow-up. She feels that she is doing really well. She lost another 3 pounds and denies any chest pain or worsening shortness of breath. Unfortunately her sister is quite ill in [place] and she is under a lot of stress as she's been traveling and visiting with her. Blood pressure appears excellent today. Her last echocardiogram was in 2011 and has not been reassessed since that time. EKG shows sinus rhythm with LVH and T-wave changes apically and anterolaterally consistent with her prior diagnosis of apical hypertrophic cardiomyopathy. With regards to her RA and lupus those have been well controlled on low-dose plaquenil.  04/17/2017  Alicia Raymond was seen today as an add-on for "heart pounding".  She reports over  the past week she has felt like her heart was racing or pounding.  She says it occasionally is associated with discomfort in the chest and some numbness in her left arm.  Afterward she feels fatigued.  It is not worse with exertion or relieved by rest, and she is continued to be able to do exercises on a treadmill.  She says it comes on randomly and is associated with fatigue.  She says that her husband listened to her heart and noted that it was pounding harder and faster than she remembered.  05/22/2017  Alicia Raymond returns today for follow-up of her monitor results.  This did not show any extrasystoles or arrhythmias that would be associated with her symptoms of palpitations.  She says they seem to be worse when having disagreements with her husband and I suspect they are related to stress.  She has had a little physical responsibilities over the past week and has felt much better.  03/07/2018  Alicia Raymond returns today for follow-up.  She wore a monitor in November which demonstrated no arrhythmias to explain her symptoms.  She does have a history of tachypalpitations.  Recently she was in the emergency department and an EKG was able to capture an SVT with heart rate into the 160s.  I was notified by the ER physician and recommended starting her on Toprol-XL 25 mg daily.  She reports taking the medicine although feels like she might be a little fatigue.  In general though her energy is much better and she says she has had significant improvement in her palpitations.  She took a week vacation with  her husband and and they went to the beach.  She was able to walk long distances and was a lot more active without having to stop and she was previously.  06/07/2018  Alicia Raymond was seen today in follow-up.  Overall she is doing well on Toprol.  She denies any recurrent SVT.  She has no concerning palpitations.  She denies chest pain or worsening shortness of breath.  She started walking more recently about 3 miles a day.  She  still battling the weight somewhat however does report eating fairly healthy diet.  06/18/2019  Alicia Raymond is seen today for annual follow-up.  Overall she reports good control of her palpitations although she occasionally has some brief breakthroughs.  It does not seem that she has enough episodes to warrant an increase in her medication.  She has no other symptoms related to apical hypertrophic cardiomyopathy.  She reports some occasional dizziness.  Blood pressure is well controlled today.  She continues to exercise regularly.  She is disappointed that she is not recently lost any weight because she says she is exercising for 40 to 60 minutes with both aerobic and resistance training, 3 times a day.  I thought this might be a little too much, but if she persists, I expect that she may eventually lose weight.  07/16/2020  Alicia Raymond is seen today in follow-up.  She saw Sande Rives in October 2021 for tightness across her back and shortness of breath with palpitations.  She wore a monitor which showed some occasional SVT.  Her beta-blocker was increased to 25 mg daily with improvement in her palpitations.  A repeat echo showed normal function with apical hypertrophic cardiomyopathy.  This was already known.  Today she says her main issue over the past several nights is again tightness across her upper back.  Usually occurs when she goes to bed and she feels stressed or may have had an argument prior to that.  The symptoms are relieved somewhat by deep tissue massage.  PMHx:  Past Medical History:  Diagnosis Date  . Apical variant hypertrophic cardiomyopathy (HCC)    Dr. Debara Pickett  . Colon polyp   . 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 Hypertension    Dr. Debara Pickett  . Osteoarthritis    Dr. Estanislado Pandy (NOT rheumatoid)  . PSVT (paroxysmal supraventricular tachycardia) ([place])     Past Surgical History:  Procedure Laterality Date  . BREAST BIOPSY Right 1988   fibroadenoma (benign)  . CARDIAC CATHETERIZATION  04/12/2010    LAD with 30% narrowing - minimal CAD (Dr. Alma Friendly)  . TRANSTHORACIC ECHOCARDIOGRAM  03/2010   EF=>55%, apical hypertrophy, mod conc LVH; mild MR; trace TR with normal RVSP  . TUBAL LIGATION      FAMHx:  Family History  Problem Relation Age of Onset  . Lung cancer Father   . Cancer Father   . COPD Brother   . Hyperlipidemia Brother   . Other Sister        brain tumor (benign)  . Hypertension Mother   . Supraventricular tachycardia Daughter   . Hyperlipidemia Son   . Cancer Sister        stomach cancer  . Cancer Sister 23       metastatic (in abdomen), unknown primary, died 2 mos later  . Diabetes Neg Hx   . Colon cancer Neg Hx   . Breast cancer Neg Hx     SOCHx:   reports that she has never smoked. She has never used smokeless tobacco.  She reports current alcohol use. She reports that she does not use drugs.  ALLERGIES:  Allergies  Allergen Reactions  . Septra [Bactrim] Swelling    Couldn't breathe, lip swelling    ROS: Pertinent items noted in HPI and remainder of comprehensive ROS otherwise negative.  HOME MEDS: Current Outpatient Medications  Medication Sig Dispense Refill  . b complex vitamins tablet Take 1 tablet by mouth daily.    Marland Kitchen CALCIUM PO Take 1 tablet by mouth daily.     . Cholecalciferol (VITAMIN D PO) Take 1 capsule by mouth daily.     . furosemide (LASIX) 40 MG tablet Take 40 mg every morning for 3 days only 3 tablet 0  . hydroxychloroquine (PLAQUENIL) 200 MG tablet Take 1 tablet (200 mg total) by mouth daily. 30 tablet 0  . losartan (COZAAR) 50 MG tablet Take 1 tablet (50 mg total) by mouth daily. 90 tablet 2  . metoprolol succinate (TOPROL-XL) 25 MG 24 hr tablet TAKE 1 TABLET BY MOUTH  DAILY 60 tablet 5  . Omega-3 Fatty Acids (FISH OIL) 1000 MG CAPS Take 1 capsule by mouth daily.     No current facility-administered medications for this visit.    LABS/IMAGING: No results found for this or any previous visit (from the past 48 hour(s)). No  results found.  VITALS: BP 120/80   Pulse 72   Ht 5\' 2"  (1.575 m)   Wt 175 lb (79.4 kg)   SpO2 99%   BMI 32.01 kg/m   EXAM: General appearance: alert and no distress Neck: no carotid bruit, no JVD and thyroid not enlarged, symmetric, no tenderness/mass/nodules Lungs: clear to auscultation bilaterally Heart: regular rate and rhythm, S1, S2 normal, no murmur, click, rub or gallop Abdomen: soft, non-tender; bowel sounds normal; no masses,  no organomegaly Extremities: extremities normal, atraumatic, no cyanosis or edema Pulses: 2+ and symmetric Skin: Skin color, texture, turgor normal. No rashes or lesions Neurologic: Grossly normal : Pleasant  EKG: Normal sinus rhythm at 72, possible left atrial enlargement, anterolateral T wave inversions consistent with apical HCM-personally reviewed  ASSESSMENT: 1. Musculoskeletal upper back pain 2. PSVT - improved on BB 3. Apical variant hypertrophic cardiomyopathy 4. Mild hypertension - controlled 5. Rheumatoid arthritis-followed by rheumatologist  PLAN: 1.   Alicia Raymond recent SVT which is better now on higher dose beta-blocker.  She is also complaining of what sounds like musculoskeletal upper back pain.  She is concerned about hyperkalemia which she is had in the past.  We will go ahead and check a metabolic profile and magnesium today.  Her symptoms improved with deep tissue massage and I suspect that this is a musculoskeletal issue.  We will provide a short course of Flexeril for her to use at night.  She was cautioned not to drive because of the sedating properties of the medicine.  Follow-up with her primary care provider.  Pixie Casino, MD, [facility], [place] Director of the Advanced Lipid Disorders &  Cardiovascular Risk Reduction Clinic Attending Cardiologist  Direct Dial: [phone]  Fax: [phone]  Website:  [email or web address] 07/16/2020, 4:11 PM

## 2020-07-16 NOTE — Patient Instructions (Signed)
Medication Instructions:  Dr. Debara Pickett has prescribed flexeril to use as needed at nighttime.   *If you need a refill on your cardiac medications before your next appointment, please call your pharmacy*  Follow-Up: At [facility], you and your health needs are our priority.  As part of our continuing mission to provide you with exceptional heart care, we have created designated Provider Care Teams.  These Care Teams include your primary Cardiologist (physician) and Advanced Practice Providers (APPs -  Physician Assistants and Nurse Practitioners) who all work together to provide you with the care you need, when you need it.  We recommend signing up for the patient portal called "MyChart".  Sign up information is provided on this After Visit Summary.  MyChart is used to connect with patients for Virtual Visits (Telemedicine).  Patients are able to view lab/test results, encounter notes, upcoming appointments, etc.  Non-urgent messages can be sent to your provider as well.   To learn more about what you can do with MyChart, go to [email or web address].    Your next appointment:   12 month(s)  The format for your next appointment:   In Person  Provider:   You may see Pixie Casino, MD or one of the following Advanced Practice Providers on your designated Care Team:    Almyra Deforest, PA-C  Fabian Sharp, PA-C or   Roby Lofts, [place]    Other Instructions

## 2020-07-17 LAB — BASIC METABOLIC PANEL
BUN/Creatinine Ratio: 23 (ref 12–28)
BUN: 18 mg/dL (ref 8–27)
CO2: 24 mmol/L (ref 20–29)
Calcium: 9.5 mg/dL (ref 8.7–10.3)
Chloride: 102 mmol/L (ref 96–106)
Creatinine, Ser: 0.77 mg/dL (ref 0.57–1.00)
GFR calc Af Amer: 93 mL/min/{1.73_m2} (ref >59)
GFR calc non Af Amer: 81 mL/min/{1.73_m2} (ref >59)
Glucose: 90 mg/dL (ref 65–99)
Potassium: 4.3 mmol/L (ref 3.5–5.2)
Sodium: 139 mmol/L (ref 134–144)

## 2020-07-17 LAB — MAGNESIUM: Magnesium: 2.4 mg/dL — ABNORMAL HIGH (ref 1.6–2.3)

## 2020-09-22 DIAGNOSIS — M170 Bilateral primary osteoarthritis of knee: Secondary | ICD-10-CM | POA: Diagnosis not present

## 2020-09-28 DIAGNOSIS — L930 Discoid lupus erythematosus: Secondary | ICD-10-CM | POA: Diagnosis not present

## 2020-09-28 DIAGNOSIS — R5383 Other fatigue: Secondary | ICD-10-CM | POA: Diagnosis not present

## 2020-09-28 DIAGNOSIS — M2550 Pain in unspecified joint: Secondary | ICD-10-CM | POA: Diagnosis not present

## 2020-09-28 DIAGNOSIS — Z79899 Other long term (current) drug therapy: Secondary | ICD-10-CM | POA: Diagnosis not present

## 2020-09-28 DIAGNOSIS — M150 Primary generalized (osteo)arthritis: Secondary | ICD-10-CM | POA: Diagnosis not present

## 2020-10-06 ENCOUNTER — Ambulatory Visit (INDEPENDENT_AMBULATORY_CARE_PROVIDER_SITE_OTHER): Payer: Medicare Other

## 2020-10-06 ENCOUNTER — Other Ambulatory Visit: Payer: Self-pay

## 2020-10-06 DIAGNOSIS — Z23 Encounter for immunization: Secondary | ICD-10-CM | POA: Diagnosis not present

## 2020-10-22 ENCOUNTER — Telehealth: Payer: Self-pay | Admitting: Internal Medicine

## 2020-10-22 ENCOUNTER — Other Ambulatory Visit: Payer: Self-pay

## 2020-10-22 MED ORDER — LOSARTAN POTASSIUM 50 MG PO TABS: 50.0000 mg | ORAL_TABLET | Freq: Every day | ORAL | 2 refills | Status: DC

## 2020-10-22 NOTE — Telephone Encounter (Signed)
*  STAT* If patient is at the pharmacy, call can be transferred to refill team.   1. Which medications need to be refilled? (please list name of each medication and dose if known)   losartan (COZAAR) 50 MG tablet    2. Which pharmacy/location (including street and city if local pharmacy) is medication to be sent to? [place], [address], [address]  3. Do they need a 30 day or 90 day supply? 90 day  Patient is out of medication

## 2020-10-22 NOTE — Telephone Encounter (Signed)
losartan (COZAAR) 50 MG tablet 90 tablet 2 10/22/2020    Sig - Route: Take 1 tablet (50 mg total) by mouth daily. - Oral     Pharmacy  [facility] - [place], [place] - [address], [address]

## 2020-10-29 ENCOUNTER — Other Ambulatory Visit: Payer: Self-pay

## 2020-10-29 ENCOUNTER — Ambulatory Visit (INDEPENDENT_AMBULATORY_CARE_PROVIDER_SITE_OTHER): Payer: Medicare Other | Admitting: Family Medicine

## 2020-10-29 ENCOUNTER — Encounter: Payer: Self-pay | Admitting: Family Medicine

## 2020-10-29 VITALS — BP 120/84 | HR 64 | Temp 97.2°F | Ht 62.0 in | Wt 171.6 lb

## 2020-10-29 DIAGNOSIS — M542 Cervicalgia: Secondary | ICD-10-CM

## 2020-10-29 DIAGNOSIS — G44209 Tension-type headache, unspecified, not intractable: Secondary | ICD-10-CM

## 2020-10-29 DIAGNOSIS — M8588 Other specified disorders of bone density and structure, other site: Secondary | ICD-10-CM

## 2020-10-29 MED ORDER — MELOXICAM 15 MG PO TABS: 15.0000 mg | ORAL_TABLET | Freq: Every day | ORAL | 0 refills | Status: DC

## 2020-10-29 NOTE — Progress Notes (Signed)
Chief Complaint  Patient presents with   Headache    Off and on for 1-2 months, at night. She wakes in her sleep with a sharp pain the back of her head. Now it is happening during the day from time to time-she also can be sitting at her desk and she feels off balance/dizzy. Sometimes she feels like her vision goes "cloudy".   For the last couple of months, she has been waking up at night with posterior headache.  She doesn't think the pain wakes her up, but when she wakes up in the middle of the night, it is hurting. It occurs about 3-4 times/week.  She does some stretches, doesn't usually need to take anything, the headache resolves on its own and she is able to go back to bed. In the last 2 weeks, she has needed to take some tylenol, which helped.  In the last week or so she has started to get the pain during the day also.  Pain is right sided, sometimes goes to the temple.  Today's HA started while she was at work.  It is at the right temple (not posterior). Her vision felt a little cloudy. The vision would improve and worsen during the duration of the HA. The headache resolved on its own, but recurred while in the office (about 10 minutes ago). No light sensitivity.  Some mild nausea, no vomiting. No similar headaches in the past. Sometimes has some tingling in her lips with the headache, none currently.  Current pain is 8/10, at R temple No allergies or cold symptoms, no known COVID exposures.  Denies stress, change in activity, new pillow. Last eye exam maybe 2 months ago (prior to headaches). No known clenching or grinding.  Younger sister died from brain tumor, so she is always worried about this. She had MRI 09/2016: IMPRESSION: Mild subcortical and periventricular T2 and FLAIR hyperintensities, likely chronic microvascular ischemic change. No acute intracranial findings. No abnormal postcontrast enhancement.  Had tightness in her neck last night, along with her headache, took  flexeril and went back to sleep.  Felt fine in the morning, until at work today, as described above.  Uses flexeril prn with tense shoulders, massage also helps. #20 with 1 RF, 2/24, almost out.  Had burning in stomach from advil in the past.  No known ulcers.  She is also asking about getting another bone density test.   Last was in 2018, showing mild osteopenia , T-1.7.  PMH, PSH, SH reviewed  Outpatient Encounter Medications as of 10/29/2020  Medication Sig Note   b complex vitamins tablet Take 1 tablet by mouth daily.    CALCIUM PO Take 1 tablet by mouth daily.     Cholecalciferol (VITAMIN D PO) Take 1 capsule by mouth daily.  02/27/2020: 1000 IU   furosemide (LASIX) 40 MG tablet Take 40 mg every morning for 3 days only    hydroxychloroquine (PLAQUENIL) 200 MG tablet Take 1 tablet (200 mg total) by mouth daily.    losartan (COZAAR) 50 MG tablet Take 1 tablet (50 mg total) by mouth daily.    metoprolol succinate (TOPROL-XL) 25 MG 24 hr tablet TAKE 1 TABLET BY MOUTH  DAILY    Omega-3 Fatty Acids (FISH OIL) 1000 MG CAPS Take 1 capsule by mouth daily.    cyclobenzaprine (FLEXERIL) 5 MG tablet Take 1 tablet (5 mg total) by mouth at bedtime as needed for muscle spasms. (Patient not taking: Reported on 10/29/2020) 10/29/2020: Took a dose last  night   No facility-administered encounter medications on file as of 10/29/2020.   Allergies  Allergen Reactions   Septra [Bactrim] Swelling    Couldn't breathe, lip swelling   ROS:  No f/c/v/d, no abdominal pain, numbness (sporadically in lips, per HPI, none know), weakness, dizziness, chest pain, palpitations.  Headache per HPI.   See HPI   PHYSICAL EXAM:  BP 120/84   Pulse 64   Temp (!) 97.2 F (36.2 C) (Tympanic)   Ht 5\' 2"  (1.575 m)   Wt 171 lb 9.6 oz (77.8 kg)   BMI 31.39 kg/m   Pleasant female, in no distress HEENT: conjunctiva and sclera are clear, EOMI, fundi benign. Sinuses are nontender. Tender at R TMJ and R temporalis muscle. NT  at temporal artery. Neck:  Spine nontender, good ROM, some discomfort with stretching (on right), no lymphadenopathy or mass Heart: regular rate and rhythm Lungs: clear bilaterally Abdomen: soft, nontender, no mass Extremities: no edema Psych: normal mood, affect, hygiene and grooming Neuro: Cranial nerves intact, normal strength, gait.  DTR's normal  ASSESSMENT/PLAN:  Tension headache - Ddx of her HA's reviewed. Trial NSAIDs. May have underlying arthritis (neck). Reviewed proper posture. heat massage, shown stretches - Plan: meloxicam (MOBIC) 15 MG tablet  Osteopenia of lumbar spine - ok to repeat DEXA.  She will schedule at [facility] and ask them to fax order  Neck pain - muscular. Discussed heat, massage, shown stretches.  Causing HA's. Trial of NSAIDs, cont flexeril prn. May need PT if not improving.    Take the meloxicam once daily with food. If it bothers your stomach, despite taking it along with either prilosec OTC once daily or Pepcid, then you can cut the dose in half. Take it every day until your pain has completely resolved (neck discomfort and headaches)--I suspect you will need it for at least 7-10 days, but can take the full 15 days, if needed. Do the neck stretches as shown at least 3x/day (after using heat, if you're able to). Chin to chest, ear to shoulder, and looking over the shoulder. Do each movement 5-10 repetitions, 3x per day. We can also consider physical therapy if needed.  You can ask your dentist if there is any evidence of teeth grinding or clenching of your jaw.  If you find yourself clenching your jaw or grinding your teeth, please make an effort to try and stop.  You may use the flexeril along with the meloxicam if needed.  Remember to try and have neurtral neck posture for reading, on phone/ipad and with computer in order to prevent neck muscle strain. (Avoid looking down--keep your head straight).

## 2020-10-29 NOTE — Patient Instructions (Signed)
  Take the meloxicam once daily with food. If it bothers your stomach, despite taking it along with either prilosec OTC once daily or Pepcid, then you can cut the dose in half. Take it every day until your pain has completely resolved (neck discomfort and headaches)--I suspect you will need it for at least 7-10 days, but can take the full 15 days, if needed. Do the neck stretches as shown at least 3x/day (after using heat, if you're able to). Chin to chest, ear to shoulder, and looking over the shoulder. Do each movement 5-10 repetitions, 3x per day. We can also consider physical therapy if needed.  You can ask your dentist if there is any evidence of teeth grinding or clenching of your jaw.  If you find yourself clenching your jaw or grinding your teeth, please make an effort to try and stop.  You may use the flexeril along with the meloxicam if needed.  Remember to try and have neurtral neck posture for reading, on phone/ipad and with computer in order to prevent neck muscle strain. (Avoid looking down--keep your head straight).

## 2020-12-12 DIAGNOSIS — Z1231 Encounter for screening mammogram for malignant neoplasm of breast: Secondary | ICD-10-CM | POA: Diagnosis not present

## 2020-12-12 DIAGNOSIS — M8588 Other specified disorders of bone density and structure, other site: Secondary | ICD-10-CM | POA: Diagnosis not present

## 2020-12-12 DIAGNOSIS — Z780 Asymptomatic menopausal state: Secondary | ICD-10-CM | POA: Diagnosis not present

## 2020-12-12 LAB — HM MAMMOGRAPHY

## 2020-12-12 LAB — HM DEXA SCAN

## 2020-12-18 DIAGNOSIS — R928 Other abnormal and inconclusive findings on diagnostic imaging of breast: Secondary | ICD-10-CM | POA: Diagnosis not present

## 2020-12-21 ENCOUNTER — Encounter: Payer: Self-pay | Admitting: *Deleted

## 2020-12-23 ENCOUNTER — Other Ambulatory Visit: Payer: Self-pay | Admitting: Family Medicine

## 2020-12-23 DIAGNOSIS — G44209 Tension-type headache, unspecified, not intractable: Secondary | ICD-10-CM

## 2020-12-23 NOTE — Telephone Encounter (Signed)
Spoke with patient, was not interested in PT-states pain is not very bad just wanted to have meloxicam on hand "in case." She will call and scheduled follow up if pain persists or worsens.

## 2020-12-23 NOTE — Telephone Encounter (Signed)
Refill is not appropriate. Written for treatment of neck pain and headaches. Not intended for longterm or chronic use. We mentioned PT if having ongoing issues.  Offer f/u appt if still having problems with pain (vs addressing with her rheumatologist)

## 2020-12-23 NOTE — Telephone Encounter (Signed)
Is this okay to refill? 

## 2020-12-24 ENCOUNTER — Encounter: Payer: Self-pay | Admitting: Family Medicine

## 2020-12-31 ENCOUNTER — Encounter: Payer: Self-pay | Admitting: Family Medicine

## 2021-02-14 ENCOUNTER — Other Ambulatory Visit: Payer: Self-pay | Admitting: Internal Medicine

## 2021-03-08 ENCOUNTER — Ambulatory Visit (INDEPENDENT_AMBULATORY_CARE_PROVIDER_SITE_OTHER): Payer: Medicare Other

## 2021-03-08 ENCOUNTER — Other Ambulatory Visit: Payer: Self-pay

## 2021-03-08 DIAGNOSIS — Z23 Encounter for immunization: Secondary | ICD-10-CM

## 2021-03-09 NOTE — Patient Instructions (Addendum)
HEALTH MAINTENANCE RECOMMENDATIONS:  It is recommended that you get at least 30 minutes of aerobic exercise at least 5 days/week (for weight loss, you may need as much as 60-90 minutes). This can be any activity that gets your heart rate up. This can be divided in 10-15 minute intervals if needed, but try and build up your endurance at least once a week.  Weight bearing exercise is also recommended twice weekly.  Eat a healthy diet with lots of vegetables, fruits and fiber.  "Colorful" foods have a lot of vitamins (ie green vegetables, tomatoes, red peppers, etc).  Limit sweet tea, regular sodas and alcoholic beverages, all of which has a lot of calories and sugar.  Up to 1 alcoholic drink daily may be beneficial for women (unless trying to lose weight, watch sugars).  Drink a lot of water.  Calcium recommendations are 1200-1500 mg daily (1500 mg for postmenopausal women or women without ovaries), and vitamin D 1000 IU daily.  This should be obtained from diet and/or supplements (vitamins), and calcium should not be taken all at once, but in divided doses.  Monthly self breast exams and yearly mammograms for women over the age of 60 is recommended.  Sunscreen of at least SPF 30 should be used on all sun-exposed parts of the skin when outside between the hours of 10 am and 4 pm (not just when at beach or pool, but even with exercise, golf, tennis, and yard work!)  Use a sunscreen that says "broad spectrum" so it covers both UVA and UVB rays, and make sure to reapply every 1-2 hours.  Remember to change the batteries in your smoke detectors when changing your clock times in the spring and fall. Carbon monoxide detectors are recommended for your home.  Use your seat belt every time you are in a car, and please drive safely and not be distracted with cell phones and texting while driving.   Alicia Raymond , Thank you for taking time to come for your Medicare Wellness Visit. I appreciate your  ongoing commitment to your health goals. Please review the following plan we discussed and let me know if I can assist you in the future.   This is a list of the screening recommended for you and due dates:  Health Maintenance  Topic Date Due   COVID-19 Vaccine (5 - Booster for Pfizer series) 02/06/2021   Mammogram  12/12/2021   Colon Cancer Screening  04/2021   Tetanus Vaccine  02/13/2024   Flu Shot  Completed   DEXA scan (bone density measurement)  Completed   Hepatitis C Screening: USPSTF Recommendation to screen - Ages 18-79 yo.  Completed   Zoster (Shingles) Vaccine  Completed   HPV Vaccine  Aged Out   Your vaccines are all up to date. Next bone density test should be in 11/2022 (2 years from the last).  Your last colon cancer screening was in 04/2014 (not 03/2015 as also shows up in your chart--I have the copy of the report.  It showed tubular adenomatous polyps, and at that time recommended 5 year follow-up.  Guidelines have since changed, so it is possible that the recommendation is now 7 years--either way, that is 04/2021.  Please contact their office to schedule.  Please look at your diet and vitamins to see how much calcium you're getting. You may need to increase your calcium to twice daily.  You may substitute non-dairy milk, as these are also fortified with calcium and vitamin D. Look  at your calcium vitamin--if it contains Magnesium, you may want to switch to a different one (that just has D).  Your Mg level was slightly elevated on last check.  Let us know if you want a referral to a nutritionist or the weight loss clinic (called [facility]). You may want to try tracking your intake for a bit to see if you can find a culprit in your diet that can be improved.  These include apps like MyFitnessPal and LoseIt.  Consider scheduling a visit with a podiatrist ([facility]) if your left foot pain/numbness persists/worsens.  Go to [facility] to  get xrays of your left foot
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
## 2021-03-10 ENCOUNTER — Ambulatory Visit (INDEPENDENT_AMBULATORY_CARE_PROVIDER_SITE_OTHER): Payer: Medicare Other | Admitting: Family Medicine

## 2021-03-10 ENCOUNTER — Other Ambulatory Visit: Payer: Self-pay

## 2021-03-10 ENCOUNTER — Ambulatory Visit
Admission: RE | Admit: 2021-03-10 | Discharge: 2021-03-10 | Disposition: A | Discharge status: Home / Self Care | Payer: Medicare Other | Source: Ambulatory Visit | Attending: Family Medicine | Admitting: Family Medicine

## 2021-03-10 ENCOUNTER — Encounter: Payer: Self-pay | Admitting: Family Medicine

## 2021-03-10 VITALS — BP 110/60 | HR 64 | Ht 61.5 in | Wt 172.0 lb

## 2021-03-10 DIAGNOSIS — M79672 Pain in left foot: Secondary | ICD-10-CM

## 2021-03-10 DIAGNOSIS — D126 Benign neoplasm of colon, unspecified: Secondary | ICD-10-CM

## 2021-03-10 DIAGNOSIS — I471 Supraventricular tachycardia: Secondary | ICD-10-CM | POA: Diagnosis not present

## 2021-03-10 DIAGNOSIS — L930 Discoid lupus erythematosus: Secondary | ICD-10-CM

## 2021-03-10 DIAGNOSIS — Z6831 Body mass index (BMI) 31.0-31.9, adult: Secondary | ICD-10-CM

## 2021-03-10 DIAGNOSIS — M170 Bilateral primary osteoarthritis of knee: Secondary | ICD-10-CM | POA: Diagnosis not present

## 2021-03-10 DIAGNOSIS — E7800 Pure hypercholesterolemia, unspecified: Secondary | ICD-10-CM

## 2021-03-10 DIAGNOSIS — Z0000 Encounter for general adult medical examination without abnormal findings: Secondary | ICD-10-CM

## 2021-03-10 DIAGNOSIS — I422 Other hypertrophic cardiomyopathy: Secondary | ICD-10-CM

## 2021-03-11 LAB — LIPID PANEL
Chol/HDL Ratio: 2.2 ratio (ref 0.0–4.4)
Cholesterol, Total: 225 mg/dL — ABNORMAL HIGH (ref 100–199)
HDL: 104 mg/dL (ref >39)
LDL Chol Calc (NIH): 112 mg/dL — ABNORMAL HIGH (ref 0–99)
Triglycerides: 53 mg/dL (ref 0–149)
VLDL Cholesterol Cal: 9 mg/dL (ref 5–40)

## 2021-03-11 NOTE — Addendum Note (Signed)
Addended by: Rita Ohara on: 03/11/2021 01:14 PM   Modules accepted: Orders

## 2021-03-17 ENCOUNTER — Other Ambulatory Visit: Payer: Self-pay

## 2021-03-17 ENCOUNTER — Ambulatory Visit: Payer: Medicare Other | Admitting: Podiatry

## 2021-03-17 DIAGNOSIS — R202 Paresthesia of skin: Secondary | ICD-10-CM

## 2021-03-17 DIAGNOSIS — R200 Anesthesia of skin: Secondary | ICD-10-CM

## 2021-03-17 MED ORDER — GABAPENTIN 100 MG PO CAPS: 100.0000 mg | ORAL_CAPSULE | Freq: Three times a day (TID) | ORAL | 3 refills | Status: DC

## 2021-03-19 NOTE — Progress Notes (Signed)
Subjective:  Patient ID: Alicia Raymond, female    DOB: 1955-06-04,  MRN: [id number]  No chief complaint on file.   65 y.o. female presents with the above complaint.  Patient presents with complaint of numbness and tingling to top of the left leg.  Patient states it comes on the top and across the whole foot.  She states is a shooting type of pain that is sharp stabbing in nature.  She has not tried any for it.  She has been taking vitamin B complex pill.  She has not seen anyone else prior to seeing me.  She has not taken gabapentin.  No history of back pain or sciatica   Review of Systems: Negative except as noted in the HPI. Denies N/V/F/Ch.  Past Medical History:  Diagnosis Date   Apical variant hypertrophic cardiomyopathy ([facility])    Dr. Debara Pickett   Colon polyp    Discoid lupus 11/30/2009   Hypertension    Dr. Debara Pickett   Osteoarthritis    Dr. Estanislado Pandy (NOT rheumatoid)   PSVT (paroxysmal supraventricular tachycardia) ([facility])     Current Outpatient Medications:    gabapentin (NEURONTIN) 100 MG capsule, Take 1 capsule (100 mg total) by mouth 3 (three) times daily., Disp: 90 capsule, Rfl: 3   b complex vitamins tablet, Take 1 tablet by mouth daily., Disp: , Rfl:    CALCIUM PO, Take 1 tablet by mouth daily. , Disp: , Rfl:    Cholecalciferol (VITAMIN D PO), Take 1 capsule by mouth daily. , Disp: , Rfl:    hydroxychloroquine (PLAQUENIL) 200 MG tablet, Take 1 tablet (200 mg total) by mouth daily., Disp: 30 tablet, Rfl: 0   losartan (COZAAR) 50 MG tablet, Take 1 tablet (50 mg total) by mouth daily., Disp: 90 tablet, Rfl: 2   metoprolol succinate (TOPROL-XL) 25 MG 24 hr tablet, TAKE 1 TABLET BY MOUTH  DAILY, Disp: 90 tablet, Rfl: 3   Omega-3 Fatty Acids (FISH OIL) 1000 MG CAPS, Take 1 capsule by mouth daily., Disp: , Rfl:   Social History   Tobacco Use  Smoking Status Never  Smokeless Tobacco Never    Allergies  Allergen Reactions   Septra [Bactrim] Swelling    Couldn't breathe, lip  swelling   Objective:  There were no vitals filed for this visit. There is no height or weight on file to calculate BMI. Constitutional Well developed. Well nourished.  Vascular Dorsalis pedis pulses palpable bilaterally. Posterior tibial pulses palpable bilaterally. Capillary refill normal to all digits.  No cyanosis or clubbing noted. Pedal hair growth normal.  Neurologic Normal speech. Oriented to person, place, and time. Positive Tinel's sign of the tarsal tunnel as well as common peroneal nerve around the fibular head.  Dermatologic Nails well groomed and normal in appearance. No open wounds. No skin lesions.  Orthopedic: Manual muscle strength 5 out of 5 to left lower extremity   Radiographs: Previous x-ray of left foot was evaluated.  No bony abnormality or pathology noted.  Soft tissues unremarkable Assessment:   1. Numbness and tingling    Plan:  Patient was evaluated and treated and all questions answered.  Left leg numbness and tingling without extensive history of lower back pain -I explained to the patient the etiology of numbness tingling and various treatment options were discussed.  On clinical exam patient has positive tarsal tunnel syndrome with also positive Tinel's sign of common peroneal nerve.  At this time given the findings I believe patient will benefit from nerve conduction  study.  The likely etiology of pathology could be proximally to both of these.  This primarily is affecting the left leg. -Nerve conduction study was ordered -I will place her on gabapentin to help with the nerve pain.  No follow-ups on file.

## 2021-03-29 DIAGNOSIS — Z800 Family history of malignant neoplasm of digestive organs: Secondary | ICD-10-CM | POA: Diagnosis not present

## 2021-03-29 DIAGNOSIS — Z86010 Personal history of colonic polyps: Secondary | ICD-10-CM | POA: Diagnosis not present

## 2021-03-29 DIAGNOSIS — K219 Gastro-esophageal reflux disease without esophagitis: Secondary | ICD-10-CM | POA: Diagnosis not present

## 2021-03-31 DIAGNOSIS — Z79899 Other long term (current) drug therapy: Secondary | ICD-10-CM | POA: Diagnosis not present

## 2021-03-31 DIAGNOSIS — M25561 Pain in right knee: Secondary | ICD-10-CM | POA: Diagnosis not present

## 2021-03-31 DIAGNOSIS — M2550 Pain in unspecified joint: Secondary | ICD-10-CM | POA: Diagnosis not present

## 2021-03-31 DIAGNOSIS — L930 Discoid lupus erythematosus: Secondary | ICD-10-CM | POA: Diagnosis not present

## 2021-03-31 DIAGNOSIS — M150 Primary generalized (osteo)arthritis: Secondary | ICD-10-CM | POA: Diagnosis not present

## 2021-04-13 DIAGNOSIS — K2970 Gastritis, unspecified, without bleeding: Secondary | ICD-10-CM

## 2021-04-13 DIAGNOSIS — K219 Gastro-esophageal reflux disease without esophagitis: Secondary | ICD-10-CM | POA: Diagnosis not present

## 2021-04-13 DIAGNOSIS — Z1211 Encounter for screening for malignant neoplasm of colon: Secondary | ICD-10-CM | POA: Diagnosis not present

## 2021-04-13 DIAGNOSIS — K635 Polyp of colon: Secondary | ICD-10-CM | POA: Diagnosis not present

## 2021-04-13 DIAGNOSIS — D122 Benign neoplasm of ascending colon: Secondary | ICD-10-CM | POA: Diagnosis not present

## 2021-04-13 DIAGNOSIS — Z86010 Personal history of colonic polyps: Secondary | ICD-10-CM | POA: Diagnosis not present

## 2021-04-13 DIAGNOSIS — D120 Benign neoplasm of cecum: Secondary | ICD-10-CM | POA: Diagnosis not present

## 2021-04-13 DIAGNOSIS — K3189 Other diseases of stomach and duodenum: Secondary | ICD-10-CM | POA: Diagnosis not present

## 2021-04-13 DIAGNOSIS — K2950 Unspecified chronic gastritis without bleeding: Secondary | ICD-10-CM | POA: Diagnosis not present

## 2021-04-13 HISTORY — DX: Gastritis, unspecified, without bleeding: K29.70

## 2021-04-13 LAB — HM COLONOSCOPY

## 2021-04-14 ENCOUNTER — Encounter: Payer: Self-pay | Admitting: *Deleted

## 2021-04-20 DIAGNOSIS — H5201 Hypermetropia, right eye: Secondary | ICD-10-CM | POA: Diagnosis not present

## 2021-04-20 DIAGNOSIS — H524 Presbyopia: Secondary | ICD-10-CM | POA: Diagnosis not present

## 2021-04-20 DIAGNOSIS — H35361 Drusen (degenerative) of macula, right eye: Secondary | ICD-10-CM | POA: Diagnosis not present

## 2021-04-20 DIAGNOSIS — H52223 Regular astigmatism, bilateral: Secondary | ICD-10-CM | POA: Diagnosis not present

## 2021-04-20 DIAGNOSIS — Z79899 Other long term (current) drug therapy: Secondary | ICD-10-CM | POA: Diagnosis not present

## 2021-04-20 LAB — HM COLONOSCOPY

## 2021-04-21 ENCOUNTER — Encounter: Payer: Self-pay | Admitting: Family Medicine

## 2021-05-18 ENCOUNTER — Other Ambulatory Visit: Payer: Self-pay | Admitting: Internal Medicine

## 2021-06-10 ENCOUNTER — Other Ambulatory Visit: Payer: Self-pay

## 2021-06-10 ENCOUNTER — Telehealth: Payer: Self-pay

## 2021-06-10 ENCOUNTER — Telehealth: Payer: Self-pay | Admitting: Internal Medicine

## 2021-06-10 DIAGNOSIS — Z0100 Encounter for examination of eyes and vision without abnormal findings: Secondary | ICD-10-CM | POA: Diagnosis not present

## 2021-06-10 MED ORDER — METOPROLOL SUCCINATE ER 25 MG PO TB24: 25.0000 mg | ORAL_TABLET | Freq: Every day | ORAL | 3 refills | Status: DC

## 2021-06-10 MED ORDER — LOSARTAN POTASSIUM 50 MG PO TABS: 50.0000 mg | ORAL_TABLET | Freq: Every day | ORAL | 3 refills | Status: DC

## 2021-06-10 NOTE — Telephone Encounter (Signed)
°*  STAT* If patient is at the pharmacy, call can be transferred to refill team.   1. Which medications need to be refilled? (please list name of each medication and dose if known) losartan (COZAAR) 50 MG tablet  metoprolol succinate (TOPROL-XL) 25 MG 24 hr tablet   2. Which pharmacy/location (including street and city if local pharmacy) is medication to be sent to? [facility] ([place], [place] Phone:  [phone]  Fax:  [phone]      3. Do they need a 30 day or 90 day supply? 90 ds

## 2021-06-10 NOTE — Telephone Encounter (Signed)
Called patient regarding refill request to advised request sent to pharmacy.

## 2021-06-11 ENCOUNTER — Telehealth: Payer: Self-pay | Admitting: Internal Medicine

## 2021-06-11 NOTE — Telephone Encounter (Signed)
°*  STAT* If patient is at the pharmacy, call can be transferred to refill team.   1. Which medications need to be refilled? (please list name of each medication and dose if known)  losartan (COZAAR) 50 MG tablet metoprolol succinate (TOPROL-XL) 25 MG 24 hr tablet  2. Which pharmacy/location (including street and city if local pharmacy) is medication to be sent to? [facility] [place], [place] to [facility]  3. Do they need a 30 day or 90 day supply? 90 with refills  [place] going [place] and will be out of the country for two months. The patient will need enough medication to last her whole trip

## 2021-06-23 ENCOUNTER — Other Ambulatory Visit: Payer: Self-pay | Admitting: Internal Medicine

## 2021-06-23 ENCOUNTER — Telehealth: Payer: Self-pay | Admitting: Internal Medicine

## 2021-06-23 MED ORDER — LOSARTAN POTASSIUM 50 MG PO TABS: 50.0000 mg | ORAL_TABLET | Freq: Every day | ORAL | 3 refills | Status: DC

## 2021-06-23 MED ORDER — METOPROLOL SUCCINATE ER 25 MG PO TB24: 25.0000 mg | ORAL_TABLET | Freq: Every day | ORAL | 3 refills | Status: DC

## 2021-06-23 NOTE — Telephone Encounter (Signed)
*  STAT* If patient is at the pharmacy, call can be transferred to refill team.   1. Which medications need to be refilled? (please list name of each medication and dose if known)  metoprolol succinate (TOPROL-XL) 25 MG 24 hr tablet losartan (COZAAR) 50 MG tablet  2. Which pharmacy/location (including street and city if local pharmacy) is medication to be sent to? [facility], [place], [place] [address]  3. Do they need a 30 day or 90 day supply?  90 day supply  Per Elmyra Ricks, patient is completely out of medication.

## 2021-10-06 DIAGNOSIS — M1991 Primary osteoarthritis, unspecified site: Secondary | ICD-10-CM | POA: Diagnosis not present

## 2021-10-06 DIAGNOSIS — E669 Obesity, unspecified: Secondary | ICD-10-CM | POA: Diagnosis not present

## 2021-10-06 DIAGNOSIS — L930 Discoid lupus erythematosus: Secondary | ICD-10-CM | POA: Diagnosis not present

## 2021-10-06 DIAGNOSIS — Z79899 Other long term (current) drug therapy: Secondary | ICD-10-CM | POA: Diagnosis not present

## 2021-10-06 DIAGNOSIS — Z6831 Body mass index (BMI) 31.0-31.9, adult: Secondary | ICD-10-CM | POA: Diagnosis not present

## 2021-12-21 DIAGNOSIS — Z1231 Encounter for screening mammogram for malignant neoplasm of breast: Secondary | ICD-10-CM | POA: Diagnosis not present

## 2021-12-29 DIAGNOSIS — N6325 Unspecified lump in the left breast, overlapping quadrants: Secondary | ICD-10-CM | POA: Diagnosis not present

## 2021-12-29 DIAGNOSIS — R922 Inconclusive mammogram: Secondary | ICD-10-CM | POA: Diagnosis not present

## 2021-12-29 DIAGNOSIS — R928 Other abnormal and inconclusive findings on diagnostic imaging of breast: Secondary | ICD-10-CM | POA: Diagnosis not present

## 2021-12-29 LAB — HM MAMMOGRAPHY

## 2022-01-05 ENCOUNTER — Encounter: Payer: Self-pay | Admitting: *Deleted

## 2022-01-05 DIAGNOSIS — N6313 Unspecified lump in the right breast, lower outer quadrant: Secondary | ICD-10-CM | POA: Diagnosis not present

## 2022-01-06 ENCOUNTER — Encounter: Payer: Self-pay | Admitting: Family Medicine

## 2022-01-26 ENCOUNTER — Encounter: Payer: Self-pay | Admitting: Internal Medicine

## 2022-01-26 NOTE — Progress Notes (Unsigned)
Cardiology Office Note:    Date:  01/26/2022   ID:  Alicia Raymond, DOB 1955/11/04, MRN [id number]  PCP:  Rita Ohara, [place] Providers Cardiologist:  Pixie Casino, MD { Click to update primary MD,subspecialty MD or APP then REFRESH:1}    Referring MD: Rita Ohara, MD   No chief complaint on file. ***  History of Present Illness:    Alicia Raymond is a 67 y.o. female with a hx of apical hypertrophic cardiomyopathy, HTN, pSVT, RA, and discoid lupus. On losartan for apical HCM. EKG with T wave changes apically and anterolaterally consistent with HCM. Heart monitor showed no arrhythmias. However, she had an ER visit in 2019 that revealed SVT. She was started on toprol. Repeat heart monitor 2021 showed occasional SVT and BB was increased with improvement in her symptoms. She was last seen by Dr. Debara Pickett 07/16/20 and was doing well at that time.   She presents for annual follow up .       Apical hypertrophic cardiomyopathy Continue losartan and BB   PSVT Continue BB   Hypertension         Past Medical History:  Diagnosis Date   Apical variant hypertrophic cardiomyopathy ([place])    Dr. Debara Pickett   Colon polyp    Discoid lupus 11/30/2009   Gastritis 04/13/2021   Dr.Hung   Hypertension    Dr. Debara Pickett   Osteoarthritis    Dr. Estanislado Pandy (NOT rheumatoid)   PSVT (paroxysmal supraventricular tachycardia) ([place])     Past Surgical History:  Procedure Laterality Date   BREAST BIOPSY Right 1988   fibroadenoma (benign)   CARDIAC CATHETERIZATION  04/12/2010   LAD with 30% narrowing - minimal CAD (Dr. Alma Friendly)   TRANSTHORACIC ECHOCARDIOGRAM  03/2010   EF=>55%, apical hypertrophy, mod conc LVH; mild MR; trace TR with normal RVSP   TUBAL LIGATION      Current Medications: No outpatient medications have been marked as taking for the 02/01/22 encounter (Appointment) with Ledora Bottcher, [place].     Allergies:   Septra [bactrim]   Social History    Socioeconomic History   Marital status: Married    Spouse name: Not on file   Number of children: 3   Years of education: Not on file   Highest education level: Not on file  Occupational History   Occupation: Press photographer support specialist    Employer: [employer]  Tobacco Use   Smoking status: Never   Smokeless tobacco: Never  Vaping Use   Vaping Use: Never used  Substance and Sexual Activity   Alcohol use: Yes    Comment: cut back, now only when has company, <1/month   Drug use: No   Sexual activity: Not Currently    Partners: Male    Comment: due to libido, discomfort.  Other Topics Concern   Not on file  Social History Narrative      Married.  Lives with her husband.  Oldest daughter lives in [place], son and daughter lives in [place], [place].  5 grandchildren.  (lost 1 daughter at the age of 2.5 yo due to illness).   Works (Press photographer support), plans to retire end of 2022.      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 There is no history of Diabetes, Colon cancer, or Breast cancer.  ROS:   Please see the history of present illness.    *** All other systems reviewed and are negative.  EKGs/Labs/Other Studies Reviewed:    The following studies were reviewed today:  Echo 03/12/20:  1. Left ventricular ejection fraction, by estimation, is 60 to 65%. The  left ventricle has normal function. There is severe  hypertrophy of the  apical segments of the left ventricle consistent with apical hypertrophic  cardiomyopathy. Left ventricular  diastolic parameters are consistent with Grade I diastolic dysfunction  (impaired relaxation).   2. Right ventricular systolic function is normal. The right ventricular  size is normal. Tricuspid regurgitation signal is inadequate for assessing  PA pressure.   3. Left atrial size was mildly dilated.   4. The mitral valve is normal in structure. Trivial mitral valve  regurgitation. No evidence of mitral stenosis.   5. The aortic valve is tricuspid. Aortic valve regurgitation is not  visualized. No aortic stenosis is present.   6. The inferior vena cava is normal in size with greater than 50%  respiratory variability, suggesting right atrial pressure of 3 mmHg.  EKG:  EKG is *** ordered today.  The ekg ordered today demonstrates ***  Recent Labs: No results found for requested labs within last 365 days.  Recent Lipid Panel    Component Value Date/Time   CHOL 225 (H) 03/10/2021 0931   TRIG 53 03/10/2021 0931   HDL 104 03/10/2021 0931   CHOLHDL 2.2 03/10/2021 0931   CHOLHDL 2.1 09/29/2016 0851   VLDL 14 09/29/2016 0851   LDLCALC 112 (H) 03/10/2021 0931     Risk Assessment/Calculations:   {Does this patient have ATRIAL FIBRILLATION?:[phone]}  No BP recorded.  {Refresh Note OR Click here to enter BP  :1}***         Physical Exam:    VS:  There were no vitals taken for this visit.    Wt Readings from Last 3 Encounters:  03/10/21 172 lb (78 kg)  10/29/20 171 lb 9.6 oz (77.8 kg)  07/16/20 175 lb (79.4 kg)     GEN: *** Well nourished, well developed in no acute distress HEENT: Normal NECK: No JVD; No carotid bruits LYMPHATICS: No lymphadenopathy CARDIAC: ***RRR, no murmurs, rubs, gallops RESPIRATORY:  Clear to auscultation without rales, wheezing or rhonchi  ABDOMEN: Soft, non-tender, non-distended MUSCULOSKELETAL:  No edema; No deformity   SKIN: Warm and dry NEUROLOGIC:  Alert and oriented x 3 PSYCHIATRIC:  Normal affect   ASSESSMENT:    No diagnosis found. PLAN:    In order of problems listed above:  ***      {Are you ordering a CV Procedure (e.g. stress test, cath, DCCV, TEE, etc)?   Press F2        :[id number]}    Medication Adjustments/Labs and Tests Ordered: Current medicines are reviewed at length with the patient today.  Concerns regarding medicines are outlined above.  No orders of the defined types were placed in this encounter.  No orders of the defined types were placed in this encounter.  There are no Patient Instructions on file for this visit.   Signed, Ledora Bottcher, PA  01/26/2022 11:04 AM    [place]

## 2022-01-31 ENCOUNTER — Other Ambulatory Visit (INDEPENDENT_AMBULATORY_CARE_PROVIDER_SITE_OTHER): Payer: No Typology Code available for payment source

## 2022-01-31 DIAGNOSIS — Z23 Encounter for immunization: Secondary | ICD-10-CM

## 2022-02-01 ENCOUNTER — Ambulatory Visit: Payer: No Typology Code available for payment source | Attending: Physician Assistant | Admitting: Physician Assistant

## 2022-02-01 ENCOUNTER — Encounter: Payer: Self-pay | Admitting: Physician Assistant

## 2022-02-01 VITALS — BP 112/74 | HR 79 | Ht 62.0 in | Wt 170.0 lb

## 2022-02-01 DIAGNOSIS — I471 Supraventricular tachycardia: Secondary | ICD-10-CM

## 2022-02-01 DIAGNOSIS — I422 Other hypertrophic cardiomyopathy: Secondary | ICD-10-CM

## 2022-02-01 DIAGNOSIS — M546 Pain in thoracic spine: Secondary | ICD-10-CM | POA: Diagnosis not present

## 2022-02-01 DIAGNOSIS — I10 Essential (primary) hypertension: Secondary | ICD-10-CM | POA: Diagnosis not present

## 2022-02-01 DIAGNOSIS — G8929 Other chronic pain: Secondary | ICD-10-CM

## 2022-02-01 MED ORDER — METOPROLOL SUCCINATE ER 25 MG PO TB24: 25.0000 mg | ORAL_TABLET | Freq: Every day | ORAL | 3 refills | Status: DC

## 2022-02-01 MED ORDER — LOSARTAN POTASSIUM 50 MG PO TABS: 50.0000 mg | ORAL_TABLET | Freq: Every day | ORAL | 3 refills | Status: DC

## 2022-02-01 NOTE — Patient Instructions (Signed)
Medication Instructions:  Your physician recommends that you continue on your current medications as directed. Please refer to the Current Medication list given to you today.   *If you need a refill on your cardiac medications before your next appointment, please call your pharmacy*   Lab Work: NONE ordered at this time of appointment   If you have labs (blood work) drawn today and your tests are completely normal, you will receive your results only by: [place] (if you have MyChart) OR A paper copy in the mail If you have any lab test that is abnormal or we need to change your treatment, we will call you to review the results.   Testing/Procedures: NONE ordered at this time of appointment    Follow-Up: At [facility], you and your health needs are our priority.  As part of our continuing mission to provide you with exceptional heart care, we have created designated Provider Care Teams.  These Care Teams include your primary Cardiologist (physician) and Advanced Practice Providers (APPs -  Physician Assistants and Nurse Practitioners) who all work together to provide you with the care you need, when you need it.  We recommend signing up for the patient portal called "MyChart".  Sign up information is provided on this After Visit Summary.  MyChart is used to connect with patients for Virtual Visits (Telemedicine).  Patients are able to view lab/test results, encounter notes, upcoming appointments, etc.  Non-urgent messages can be sent to your provider as well.   To learn more about what you can do with MyChart, go to [email or web address].    Your next appointment:   1 year(s)  The format for your next appointment:   In Person  Provider:   Pixie Casino, MD     Other Instructions   Important Information About Sugar

## 2022-02-03 ENCOUNTER — Encounter: Payer: Self-pay | Admitting: Family Medicine

## 2022-02-24 DIAGNOSIS — I471 Supraventricular tachycardia: Secondary | ICD-10-CM | POA: Diagnosis not present

## 2022-02-24 DIAGNOSIS — E663 Overweight: Secondary | ICD-10-CM | POA: Diagnosis not present

## 2022-02-24 DIAGNOSIS — I422 Other hypertrophic cardiomyopathy: Secondary | ICD-10-CM | POA: Diagnosis not present

## 2022-02-24 DIAGNOSIS — M3210 Systemic lupus erythematosus, organ or system involvement unspecified: Secondary | ICD-10-CM | POA: Diagnosis not present

## 2022-02-24 DIAGNOSIS — Z008 Encounter for other general examination: Secondary | ICD-10-CM | POA: Diagnosis not present

## 2022-02-24 DIAGNOSIS — I10 Essential (primary) hypertension: Secondary | ICD-10-CM | POA: Diagnosis not present

## 2022-02-24 DIAGNOSIS — Z6829 Body mass index (BMI) 29.0-29.9, adult: Secondary | ICD-10-CM | POA: Diagnosis not present

## 2022-03-01 ENCOUNTER — Encounter: Payer: Self-pay | Admitting: Internal Medicine

## 2022-03-16 NOTE — Patient Instructions (Incomplete)
  HEALTH MAINTENANCE RECOMMENDATIONS:  It is recommended that you get at least 30 minutes of aerobic exercise at least 5 days/week (for weight loss, you may need as much as 60-90 minutes). This can be any activity that gets your heart rate up. This can be divided in 10-15 minute intervals if needed, but try and build up your endurance at least once a week.  Weight bearing exercise is also recommended twice weekly.  Eat a healthy diet with lots of vegetables, fruits and fiber.  "Colorful" foods have a lot of vitamins (ie green vegetables, tomatoes, red peppers, etc).  Limit sweet tea, regular sodas and alcoholic beverages, all of which has a lot of calories and sugar.  Up to 1 alcoholic drink daily may be beneficial for women (unless trying to lose weight, watch sugars).  Drink a lot of water.  Calcium recommendations are 1200-1500 mg daily (1500 mg for postmenopausal women or women without ovaries), and vitamin D 1000 IU daily.  This should be obtained from diet and/or supplements (vitamins), and calcium should not be taken all at once, but in divided doses.  Monthly self breast exams and yearly mammograms for women over the age of 21 is recommended.  Sunscreen of at least SPF 30 should be used on all sun-exposed parts of the skin when outside between the hours of 10 am and 4 pm (not just when at beach or pool, but even with exercise, golf, tennis, and yard work!)  Use a sunscreen that says "broad spectrum" so it covers both UVA and UVB rays, and make sure to reapply every 1-2 hours.  Remember to change the batteries in your smoke detectors when changing your clock times in the spring and fall. Carbon monoxide detectors are recommended for your home.  Use your seat belt every time you are in a car, and please drive safely and not be distracted with cell phones and texting while driving.   Alicia Raymond , Thank you for taking time to come for your Medicare Wellness Visit. I appreciate your  ongoing commitment to your health goals. Please review the following plan we discussed and let me know if I can assist you in the future.   This is a list of the screening recommended for you and due dates:  Health Maintenance  Topic Date Due   COVID-19 Vaccine (6 - Pfizer risk series) 05/03/2021   Mammogram  12/30/2022   Tetanus Vaccine  02/13/2024   Colon Cancer Screening  04/20/2026   Pneumonia Vaccine  Completed   Flu Shot  Completed   DEXA scan (bone density measurement)  Completed   Hepatitis C Screening: USPSTF Recommendation to screen - Ages 36-79 yo.  Completed   Zoster (Shingles) Vaccine  Completed   HPV Vaccine  Aged Out   I recommend getting the RSV vaccine from the pharmacy (separate from other vaccines by 2 weeks).  Next bone density test is due next year (11/2022).  Please bring [place] copies of your Living Will and [place] once completed and notarized so that it can be scanned into your medical chart.  [place] cut back on the egg yolks--no more than 5/week. Feel free to continue to have egg whites (which are high in protein, but less cholesterol/fat). Try and eat more plant-based protein (nuts, seeds, legumes) and cut back on beef.  Look into [facility] Weight Loss program to see if this interests you, to help you with your weight loss.

## 2022-03-16 NOTE — Progress Notes (Signed)
Chief Complaint  Patient presents with   Annual Exam    Mwv no other issues may want to discuss RSV will get covid booster    Alicia Raymond is a 66 y.o. female who presents for annual physical exam,  Medicare annual wellness visit, and follow-up on chronic medical conditions.    She is under the care of Dr. Debara Pickett for apical variant hypertrophic cardiomyopathy, mild hypertension and SVT. She last saw cardiology in 01/2022.  She remains on Toprol XL for SVT. She had 1 episode recently where her heart was beating very hard, just for a few beats, occurred 3x that night, none since. She continues on losartan, for HTN and apical hypertrophic cardiomyopathy. She denies any chest pain, lightheadedness, headaches, dizziness, syncope, edema or any side effects to medications.   Arthritis and discoid lupus:  Under the care of Dr. Trudie Reed, doing well on Plaquenil. Last seen 09/2021. C-met and CBC ordered per notes. Results not sent to [place] (see below, got faxed over during visit). 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  Didn't have any flares while in [place].  Osteopenia:  last DEXA was 11/2020, T-1.8 at spine. She takes 563m of calcium once daily.  She likes almond milk, has 1 glass daily.  No dairy products (gets diarrhea after eating).  She gets weight-bearing exercise regularly.  She takes vitamin D 1000 IU, and thinks that her calcium also has D in it.  Obesity: She is a little frustrated she hasn't lost weight.  She exercises a lot. Only drinks Sprite if she feels she needs something sweet, not on a regular basis (1/month).  No juice, hasn't had wine in months. +Rice, portion is 1/2 cup (recalls being told not to have brown rice due to K+ elevation in the past)..  Denies potatoes, pasta, breads. Protein sources include fish, beef, chicken, reports portions are small. No fried foods, rare fast food. Snacks include fruit (banana,  apple, carrots). No sweets or nuts.  Rare ice cream. She eats 2 hard boiled eggs every day. She gets daily exercise.  Immunization History  Administered Date(s) Administered   Fluad Quad(high Dose 65+) 01/16/2019, 01/31/2022   Influenza Inj Mdck Quad Pf 02/09/2018   Influenza Split 04/03/2012, 02/08/2013, 02/12/2014   Influenza, High Dose Seasonal PF 02/13/2020   Influenza-Unspecified 02/13/2015, 02/18/2016, 02/16/2017, 02/16/2021   PFIZER(Purple Top)SARS-COV-2 Vaccination 07/13/2019, 08/07/2019, 02/27/2020, 10/06/2020   Pfizer Covid-19 Vaccine Bivalent Booster 148yr& up 03/08/2021   Pneumococcal Conjugate-13 12/12/2018   Pneumococcal Polysaccharide-23 02/08/2013, 03/20/2020   Tdap 02/12/2014   Zoster Recombinat (Shingrix) 02/16/2017, 06/23/2017   Zoster, Live 08/26/2014   Last Pap smear: 09/2017, normal, no high risk HPV Last mammogram: 12/2021 (and underwent cyst aspiration L breast) Last colonoscopy: 03/2021 Dr. HuWard Givenserrated polyps, f/u 5 years recommended (Dr. HuBenson [place]  EGD also done 03/2021 by Dr. HuManley Masonhronic inactive gastritis, negative for H.pylori Last DEXA:  11/2020 T-1.8 spine (prev 11/2016: T-1.7 spine, 09/2014 T-1.6 spine, 12/2012 T-1.3) Dentist: twice yearly Ophtho: yearly Exercise:  She rides recumbent bike 30 minutes daily, aerobics classes through YouTube 30 minutes daily. Able to walk some now, that her knee is feeling better (3-4 miles 5x/week).  Weights at home/work (daily, upper and lower body).  Still does yoga stretches daily.  Vitamin D-OH was 34 in 11/2016 Lipids: Lipids were elevated in 2021, with LDL 145. Improved last year Lab Results  Component Value Date   CHOL 225 (H)  03/10/2021   HDL 104 03/10/2021   LDLCALC 112 (H) 03/10/2021   TRIG 53 03/10/2021   CHOLHDL 2.2 03/10/2021   Thyroid screen: Lab Results  Component Value Date   TSH 1.660 02/26/2020    Patient Care Team: Rita Ohara, MD as PCP - General (Family Medicine) Debara Pickett Nadean Corwin, MD as PCP - Cardiology (Cardiology) Gavin Pound, MD as Consulting Physician (Rheumatology) Keene Breath., MD (Ophthalmology) Carol Ada, MD as Consulting Physician (Gastroenterology) Elsie Saas, MD as Consulting Physician (Orthopedic Surgery) [place], [place], [place], Tami Lin, [place] as Physician Assistant (Cardiology)  Depression Screening: [facility] Office Visit from 03/17/2022 in [place]  PHQ-2 Total Score 0       Falls screen:     03/17/2022    8:31 AM 03/10/2021    8:42 AM 10/29/2020   12:09 PM 02/27/2020    9:49 AM 12/12/2018    8:47 AM  [place] in the past year? 0 0 0 0 0  Number falls in past yr: 0 0 0    Injury with Fall? 0 0 0    Risk for fall due to : No Fall Risks No Fall Risks No Fall Risks    Follow up Falls evaluation completed Falls evaluation completed Falls evaluation completed       Functional Status Survey: Is the patient deaf or have difficulty hearing?: No Does the patient have difficulty seeing, even when wearing glasses/contacts?: No Does the patient have difficulty concentrating, remembering, or making decisions?: No Does the patient have difficulty walking or climbing stairs?: Yes (rt. knee pain) Does the patient have difficulty dressing or bathing?: No Does the patient have difficulty doing errands alone such as visiting a doctor's office or shopping?: No  Mini-Cog Scoring: 5    End of Life Discussion:  Patient does not have a living will and medical power of attorney.  Given new forms last year, hasn't completed yet that she can recall.   PMH, PSH, SH and FH were reviewed and updated  Outpatient Encounter Medications as of 03/17/2022  Medication Sig Note   b complex vitamins tablet Take 1 tablet by mouth daily.    CALCIUM PO Take 1 tablet by mouth daily.  03/10/2021: Unsure of dose, thinks 57m, thinks it might also have D, will check her bottle   Cholecalciferol (VITAMIN D PO) Take  1 capsule by mouth daily.  02/27/2020: 1000 IU   hydroxychloroquine (PLAQUENIL) 200 MG tablet Take 1 tablet (200 mg total) by mouth daily.    losartan (COZAAR) 50 MG tablet Take 1 tablet (50 mg total) by mouth daily.    metoprolol succinate (TOPROL-XL) 25 MG 24 hr tablet Take 1 tablet (25 mg total) by mouth daily.    Omega-3 Fatty Acids (FISH OIL) 1000 MG CAPS Take 1 capsule by mouth daily.    [DISCONTINUED] gabapentin (NEURONTIN) 100 MG capsule Take 1 capsule (100 mg total) by mouth 3 (three) times daily. (Patient not taking: Reported on 03/17/2022)    No facility-administered encounter medications on file as of 03/17/2022.   Allergies  Allergen Reactions   Septra [Bactrim] Swelling    Couldn't breathe, lip swelling   ROS:  The patient denies anorexia, fever, vision changes, decreased hearing, ear pain, sore throat, breast concerns, chest pain, dizziness, syncope, dyspnea on exertion, cough, swelling, nausea, vomiting, diarrhea, constipation, abdominal pain, melena, hematochezia, indigestion/heartburn, hematuria, incontinence, dysuria, postmenopausal bleeding, vaginal discharge, odor or itch, genital lesions, weakness, tremor,  suspicious skin lesions, abnormal bleeding/bruising, or enlarged lymph nodes. Ringing in her right ear (intermittent); denies hearing loss. Unchanged. Decreased libido, some vaginal dryness and discomfort. No longer sexually active. Notices  some hair loss/thinning.  Having trouble losing weight despite trying hard. No changes to nails, bowels, skin or moods. Palpitations recently, per HPI. No rashes or flares of discoid lupus. Knee pain, improved after injections.  Mainly has issues with R knee, doing well currently. L foot pain resolved (no longer takes gabapentin, rx'd by podiatrist). She had a cough for weeks last month, resolved.    PHYSICAL EXAM:  BP 124/78   Pulse 77   Temp 98.3 F (36.8 C)   Ht 5' 2.5" (1.588 m)   Wt 168 lb 9.6 oz (76.5 kg)   BMI 30.35  kg/m   Wt Readings from Last 3 Encounters:  03/17/22 168 lb 9.6 oz (76.5 kg)  02/01/22 170 lb (77.1 kg)  03/10/21 172 lb (78 kg)   General Appearance:     Alert, cooperative, no distress, appears stated age   Head:     Normocephalic, without obvious abnormality, atraumatic   Eyes:     PERRL, conjunctiva/corneas clear, EOM's intact, fundi benign   Ears:     Normal TM's and external ear canals   Nose:    No drainage or sinus tenderness  Throat:    Normal mucosa  Neck:    Supple, no lymphadenopathy;  thyroid:  no enlargement/ tenderness/nodules; no carotid bruit or JVD   Back:     Spine nontender, no curvature, ROM normal, no CVA tenderness   Lungs:      Clear to auscultation bilaterally without wheezes, rales or ronchi; respirations unlabored   Chest Wall:     No tenderness or deformity    Heart:     Regular rate and rhythm, S1 and S2 normal, no murmur, rub or gallop   Breast Exam:     No tenderness, masses, or nipple discharge or inversion. No axillary lymphadenopathy.  WHSS right upper breast   Abdomen:      Soft, non-tender, nondistended, normoactive bowel sounds, no masses, no hepatosplenomegaly   Genitalia:     Normal external genitalia without lesions.  BUS and vagina normal, some atrophic changes; no cervical motion tenderness. No abnormal vaginal discharge.  Uterus and adnexa not enlarged, nontender, no masses.  Pap not performed   Rectal:     Normal tone, no masses or tenderness; heme negative stool  Extremities:    No clubbing, cyanosis or edema.   Pulses:    1+ and symmetric all extremities   Skin:    Skin color, texture, turgor normal, no rashes or lesions.  Lymph nodes:    Cervical, supraclavicular, and axillary nodes normal   Neurologic:    Normal strength, gait; reflexes 2+ and symmetric throughout.                     Psych:   Normal mood, affect, hygiene and grooming   ASSESSMENT/PLAN:  Annual physical exam  Medicare annual wellness visit, subsequent  Apical  variant hypertrophic cardiomyopathy ([place]) - stable on current regimen per cardiology  PSVT (paroxysmal supraventricular tachycardia) - controlled on current regimen  Discoid lupus erythematosus - doing well on plaquenil per Dr. Trudie Reed  Hypercholesterolemia - cont lowfat, low cholesterol diet. LDL improved last year, recheck. Encouraged to cut back on egg yolk intake to 5/wk or less - Plan: Lipid panel  Osteopenia of lumbar spine - cont  Ca, D, weight-bearing exercise. DEXA next year  Essential hypertension - well controlled  Medication monitoring encounter - Plan: Comprehensive metabolic panel  Hair loss - check TSH; no significant other thyroid sx, other than trouble losing weight - Plan: TSH  BMI 30.0-30.9,adult - counseled re: diet and exercise in detail  COVID-19 vaccine administered - Plan: Pfizer Fall 2023 Covid-19 Vaccine 28yr and older  Lab results received from Dr. HTrudie Reedoffice-- Normal CBC. ALT 37 (sl high), glu 67, rest was normal  Lipids, c-met TSH  Discussed monthly self breast exams and yearly mammograms; at least 30 minutes of aerobic activity at least 5 days/week, weight-bearing exercise at least 2x/wk; proper sunscreen use reviewed; healthy diet, including goals of calcium and vitamin D intake and alcohol recommendations (less than or equal to 1 drink/day) reviewed; regular seatbelt use; changing batteries in smoke detectors. Immunizations--COVID booster given. Continue yearly high dose flu shots. RSV vaccine recommended from pharmacy. DEXA due again 11/2022    Discussed living will, healthcare power of attorney, and advised to get u[place]opy of notarized forms to be scanned into her chart.  New forms given.   MOST form completed, full code, full care   F/u 1 year, sooner prn.   Medicare Attestation I have personally reviewed: The patient's medical and social history Their use of alcohol, tobacco or illicit drugs Their current medications and supplements The  patient's functional ability including ADLs,fall risks, home safety risks, cognitive, and hearing and visual impairment Diet and physical activities Evidence for depression or mood disorders  The patient's weight, height, BMI have been recorded in the chart.  I have made referrals, counseling, and provided education to the patient based on review of the above and I have provided the patient with a written personalized care plan for preventive services.

## 2022-03-17 ENCOUNTER — Encounter: Payer: Self-pay | Admitting: Family Medicine

## 2022-03-17 ENCOUNTER — Ambulatory Visit (INDEPENDENT_AMBULATORY_CARE_PROVIDER_SITE_OTHER): Payer: No Typology Code available for payment source | Admitting: Family Medicine

## 2022-03-17 VITALS — BP 124/78 | HR 77 | Temp 98.3°F | Ht 62.5 in | Wt 168.6 lb

## 2022-03-17 DIAGNOSIS — L930 Discoid lupus erythematosus: Secondary | ICD-10-CM | POA: Diagnosis not present

## 2022-03-17 DIAGNOSIS — Z23 Encounter for immunization: Secondary | ICD-10-CM | POA: Diagnosis not present

## 2022-03-17 DIAGNOSIS — E7800 Pure hypercholesterolemia, unspecified: Secondary | ICD-10-CM

## 2022-03-17 DIAGNOSIS — Z5181 Encounter for therapeutic drug level monitoring: Secondary | ICD-10-CM

## 2022-03-17 DIAGNOSIS — I10 Essential (primary) hypertension: Secondary | ICD-10-CM | POA: Diagnosis not present

## 2022-03-17 DIAGNOSIS — L659 Nonscarring hair loss, unspecified: Secondary | ICD-10-CM

## 2022-03-17 DIAGNOSIS — Z6830 Body mass index (BMI) 30.0-30.9, adult: Secondary | ICD-10-CM | POA: Diagnosis not present

## 2022-03-17 DIAGNOSIS — I471 Supraventricular tachycardia, unspecified: Secondary | ICD-10-CM | POA: Diagnosis not present

## 2022-03-17 DIAGNOSIS — I422 Other hypertrophic cardiomyopathy: Secondary | ICD-10-CM | POA: Diagnosis not present

## 2022-03-17 DIAGNOSIS — M8588 Other specified disorders of bone density and structure, other site: Secondary | ICD-10-CM

## 2022-03-17 DIAGNOSIS — Z0000 Encounter for general adult medical examination without abnormal findings: Secondary | ICD-10-CM | POA: Diagnosis not present

## 2022-03-18 LAB — COMPREHENSIVE METABOLIC PANEL
ALT: 15 IU/L (ref 0–32)
AST: 21 IU/L (ref 0–40)
Albumin/Globulin Ratio: 1.3 (ref 1.2–2.2)
Albumin: 3.9 g/dL (ref 3.9–4.9)
Alkaline Phosphatase: 56 IU/L (ref 44–121)
BUN/Creatinine Ratio: 16 (ref 12–28)
BUN: 13 mg/dL (ref 8–27)
Bilirubin Total: 0.5 mg/dL (ref 0.0–1.2)
CO2: 24 mmol/L (ref 20–29)
Calcium: 9.4 mg/dL (ref 8.7–10.3)
Chloride: 104 mmol/L (ref 96–106)
Creatinine, Ser: 0.83 mg/dL (ref 0.57–1.00)
Globulin, Total: 2.9 g/dL (ref 1.5–4.5)
Glucose: 81 mg/dL (ref 70–99)
Potassium: 4.8 mmol/L (ref 3.5–5.2)
Sodium: 141 mmol/L (ref 134–144)
Total Protein: 6.8 g/dL (ref 6.0–8.5)
eGFR: 77 mL/min/{1.73_m2} (ref >59)

## 2022-03-18 LAB — LIPID PANEL
Chol/HDL Ratio: 2.4 ratio (ref 0.0–4.4)
Cholesterol, Total: 216 mg/dL — ABNORMAL HIGH (ref 100–199)
HDL: 91 mg/dL (ref >39)
LDL Chol Calc (NIH): 110 mg/dL — ABNORMAL HIGH (ref 0–99)
Triglycerides: 85 mg/dL (ref 0–149)
VLDL Cholesterol Cal: 15 mg/dL (ref 5–40)

## 2022-03-18 LAB — TSH: TSH: 1.81 u[IU]/mL (ref 0.450–4.500)

## 2022-03-28 ENCOUNTER — Encounter: Payer: Self-pay | Admitting: Family Medicine

## 2022-04-05 DIAGNOSIS — E669 Obesity, unspecified: Secondary | ICD-10-CM | POA: Diagnosis not present

## 2022-04-05 DIAGNOSIS — Z6830 Body mass index (BMI) 30.0-30.9, adult: Secondary | ICD-10-CM | POA: Diagnosis not present

## 2022-04-05 DIAGNOSIS — M1991 Primary osteoarthritis, unspecified site: Secondary | ICD-10-CM | POA: Diagnosis not present

## 2022-04-05 DIAGNOSIS — Z79899 Other long term (current) drug therapy: Secondary | ICD-10-CM | POA: Diagnosis not present

## 2022-04-05 DIAGNOSIS — L930 Discoid lupus erythematosus: Secondary | ICD-10-CM | POA: Diagnosis not present

## 2022-04-26 DIAGNOSIS — H47022 Hemorrhage in optic nerve sheath, left eye: Secondary | ICD-10-CM | POA: Diagnosis not present

## 2022-04-26 DIAGNOSIS — H35361 Drusen (degenerative) of macula, right eye: Secondary | ICD-10-CM | POA: Diagnosis not present

## 2022-04-26 DIAGNOSIS — M3210 Systemic lupus erythematosus, organ or system involvement unspecified: Secondary | ICD-10-CM | POA: Diagnosis not present

## 2022-04-26 DIAGNOSIS — Z79899 Other long term (current) drug therapy: Secondary | ICD-10-CM | POA: Diagnosis not present

## 2022-05-06 IMAGING — CR DG FOOT COMPLETE 3+V*L*
3 series · 3 of 3 positions shown · non-contrast
Comparison: None.

CLINICAL DATA: Fourth and fifth metatarsal pain

EXAM:
LEFT FOOT - COMPLETE 3+ VIEW

[x foot ap left]
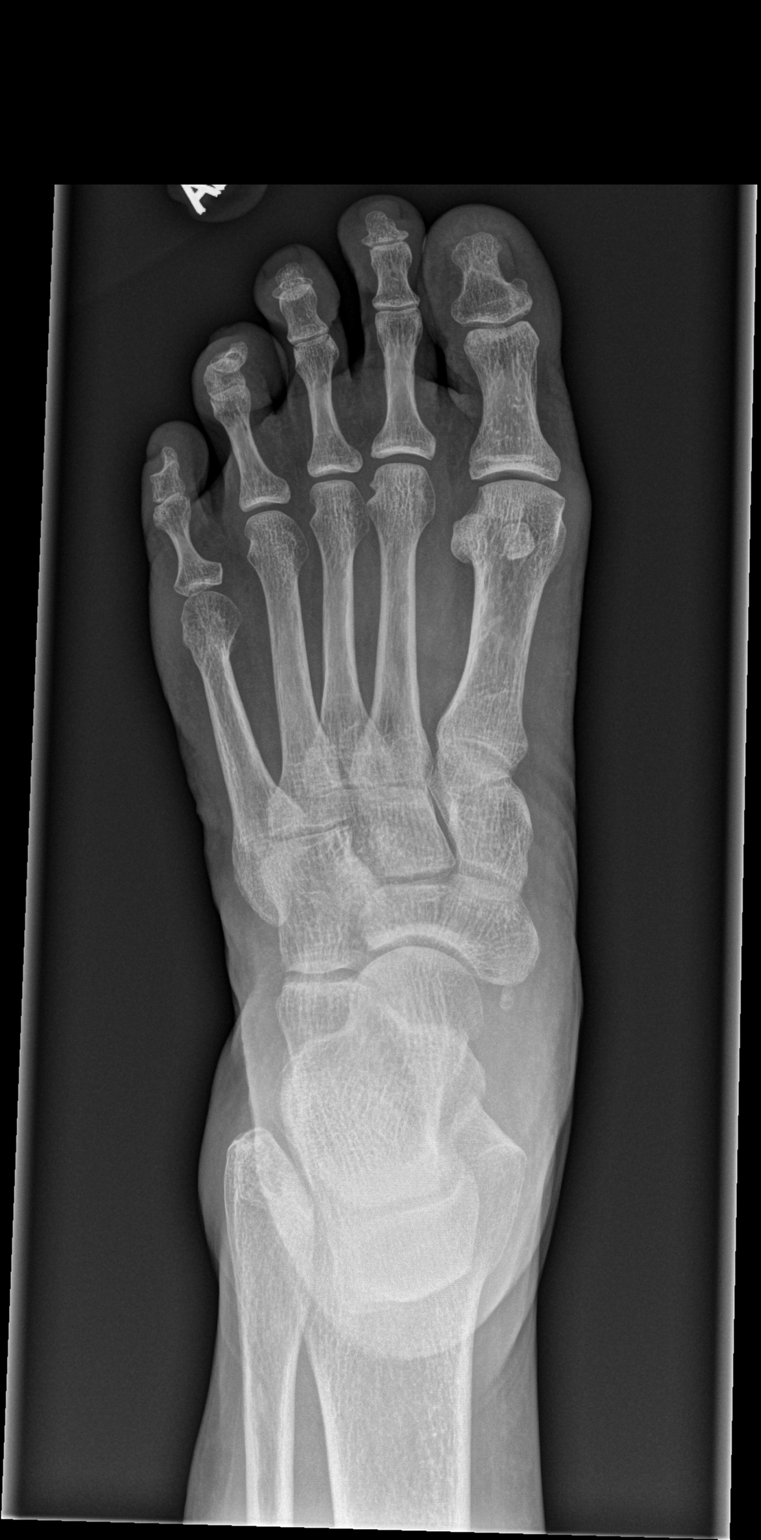

[x foot obl left]
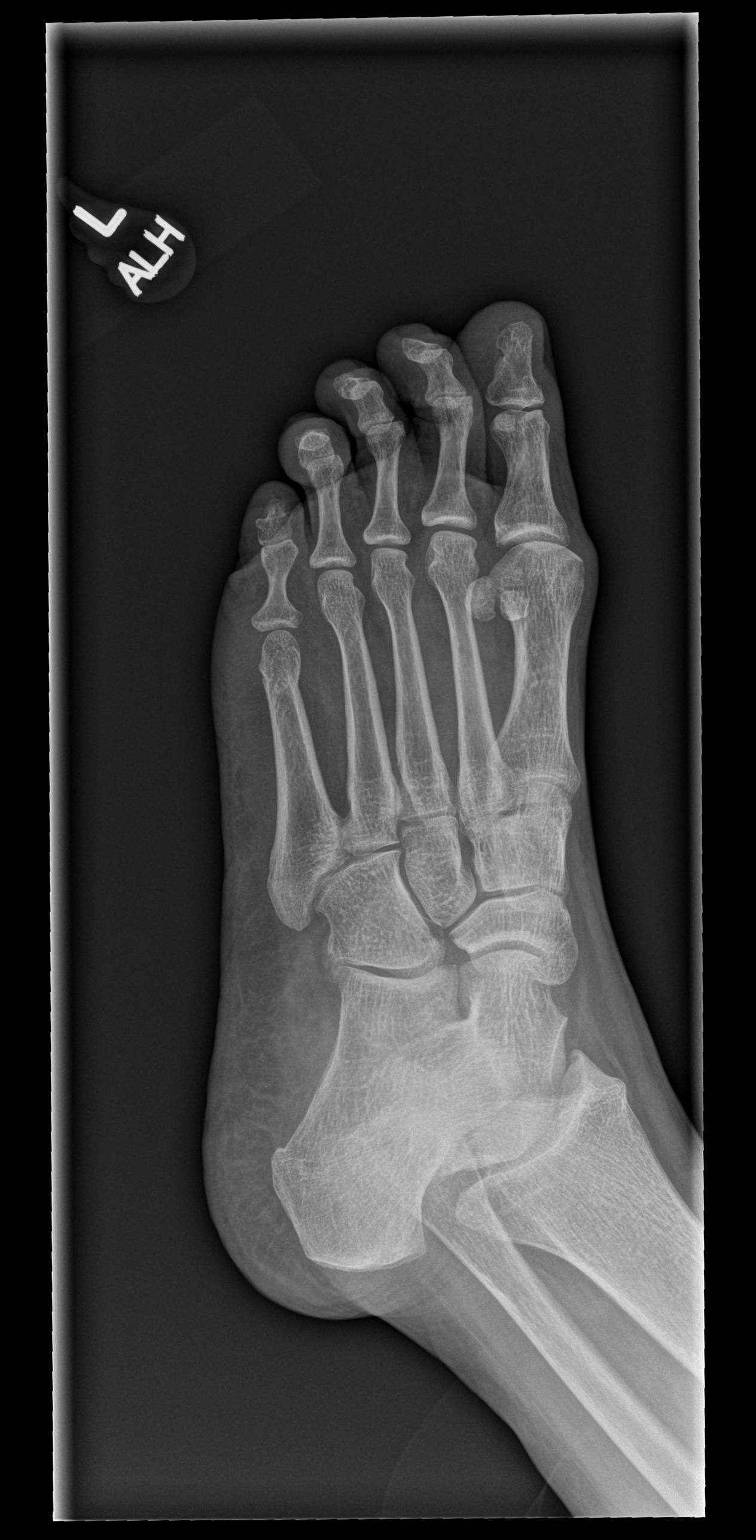

[x foot lat left]
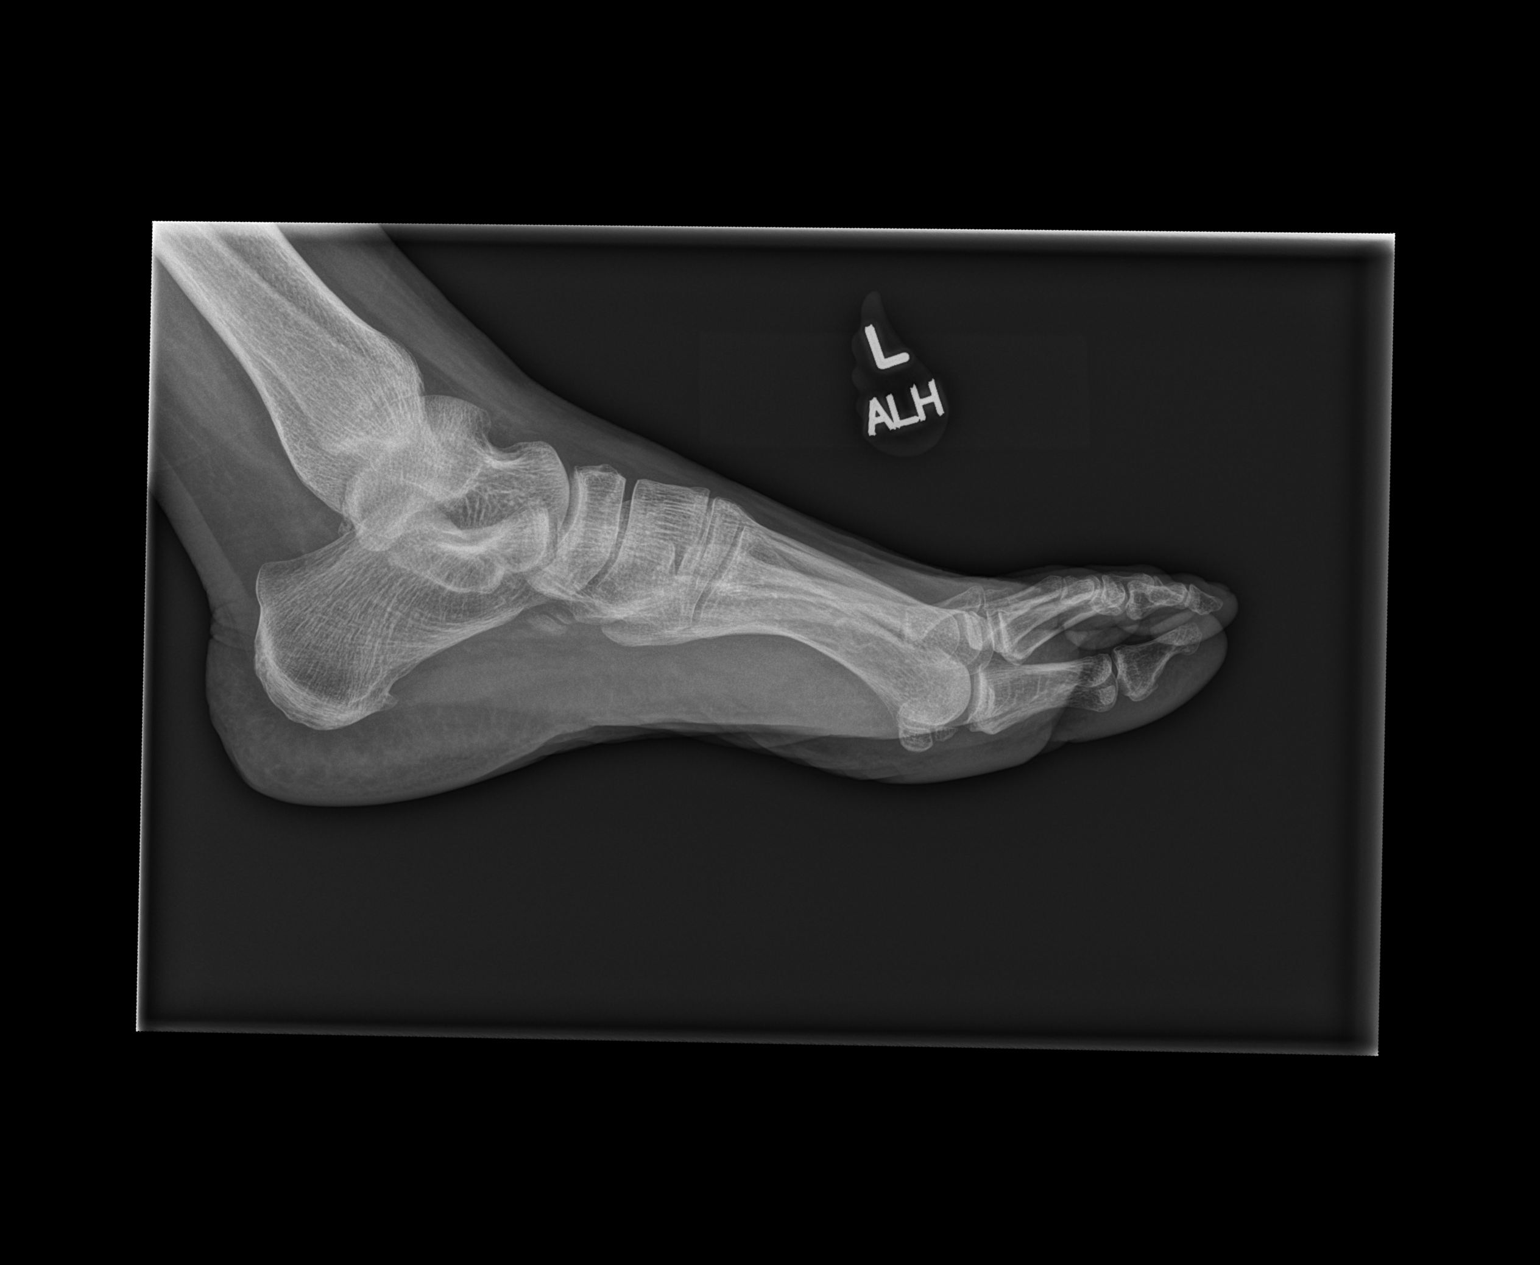

[3 of 3 positions shown; findings below may reference images not displayed]

FINDINGS: There is no evidence of fracture or dislocation. There is no
evidence of arthropathy or other focal bone abnormality. Soft
tissues are unremarkable.
IMPRESSION: No acute osseous finding

## 2022-06-27 DIAGNOSIS — H2513 Age-related nuclear cataract, bilateral: Secondary | ICD-10-CM | POA: Diagnosis not present

## 2022-06-27 DIAGNOSIS — H47022 Hemorrhage in optic nerve sheath, left eye: Secondary | ICD-10-CM | POA: Diagnosis not present

## 2022-06-27 DIAGNOSIS — H40022 Open angle with borderline findings, high risk, left eye: Secondary | ICD-10-CM | POA: Diagnosis not present

## 2022-06-27 DIAGNOSIS — M3210 Systemic lupus erythematosus, organ or system involvement unspecified: Secondary | ICD-10-CM | POA: Diagnosis not present

## 2022-06-27 DIAGNOSIS — Z79899 Other long term (current) drug therapy: Secondary | ICD-10-CM | POA: Diagnosis not present

## 2022-07-22 ENCOUNTER — Other Ambulatory Visit: Payer: Self-pay | Admitting: Internal Medicine

## 2022-07-28 ENCOUNTER — Other Ambulatory Visit: Payer: Self-pay | Admitting: Internal Medicine

## 2022-07-28 MED ORDER — LOSARTAN POTASSIUM 50 MG PO TABS: 50.0000 mg | ORAL_TABLET | Freq: Every day | ORAL | 1 refills | Status: DC

## 2022-10-04 DIAGNOSIS — M1991 Primary osteoarthritis, unspecified site: Secondary | ICD-10-CM | POA: Diagnosis not present

## 2022-10-04 DIAGNOSIS — Z79899 Other long term (current) drug therapy: Secondary | ICD-10-CM | POA: Diagnosis not present

## 2022-10-04 DIAGNOSIS — E669 Obesity, unspecified: Secondary | ICD-10-CM | POA: Diagnosis not present

## 2022-10-04 DIAGNOSIS — L930 Discoid lupus erythematosus: Secondary | ICD-10-CM | POA: Diagnosis not present

## 2022-10-04 DIAGNOSIS — Z6830 Body mass index (BMI) 30.0-30.9, adult: Secondary | ICD-10-CM | POA: Diagnosis not present

## 2022-10-04 DIAGNOSIS — I7300 Raynaud's syndrome without gangrene: Secondary | ICD-10-CM | POA: Diagnosis not present

## 2022-11-07 ENCOUNTER — Telehealth: Payer: Self-pay | Admitting: Internal Medicine

## 2022-11-07 NOTE — Telephone Encounter (Signed)
Patient is requesting to speak with Dr. Blanchie Dessert nurse. She would like to know why he isn't listed on her MyChart.

## 2022-11-07 NOTE — Telephone Encounter (Signed)
Call to patient.  LVM to return call

## 2022-11-08 NOTE — Telephone Encounter (Signed)
She states that she cannot schedule appt. with Dr Rennis Golden on My chart. Advised some are not available. Advised she can send a message to [place] on my chart asking to schedule with him and we will forward to scheduling for her.  She states understanding

## 2022-12-26 DIAGNOSIS — H2513 Age-related nuclear cataract, bilateral: Secondary | ICD-10-CM | POA: Diagnosis not present

## 2022-12-26 DIAGNOSIS — M3210 Systemic lupus erythematosus, organ or system involvement unspecified: Secondary | ICD-10-CM | POA: Diagnosis not present

## 2022-12-26 DIAGNOSIS — Z79899 Other long term (current) drug therapy: Secondary | ICD-10-CM | POA: Diagnosis not present

## 2022-12-26 DIAGNOSIS — H40013 Open angle with borderline findings, low risk, bilateral: Secondary | ICD-10-CM | POA: Diagnosis not present

## 2022-12-27 DIAGNOSIS — Z1231 Encounter for screening mammogram for malignant neoplasm of breast: Secondary | ICD-10-CM | POA: Diagnosis not present

## 2022-12-27 LAB — HM MAMMOGRAPHY

## 2022-12-28 ENCOUNTER — Other Ambulatory Visit: Payer: Self-pay | Admitting: *Deleted

## 2022-12-28 ENCOUNTER — Encounter: Payer: Self-pay | Admitting: Family Medicine

## 2022-12-28 ENCOUNTER — Encounter: Payer: Self-pay | Admitting: *Deleted

## 2022-12-28 DIAGNOSIS — E7800 Pure hypercholesterolemia, unspecified: Secondary | ICD-10-CM

## 2022-12-28 DIAGNOSIS — I422 Other hypertrophic cardiomyopathy: Secondary | ICD-10-CM

## 2022-12-28 DIAGNOSIS — R921 Mammographic calcification found on diagnostic imaging of breast: Secondary | ICD-10-CM

## 2023-01-13 ENCOUNTER — Ambulatory Visit (HOSPITAL_BASED_OUTPATIENT_CLINIC_OR_DEPARTMENT_OTHER)
Admission: RE | Admit: 2023-01-13 | Discharge: 2023-01-13 | Disposition: A | Discharge status: Home / Self Care | Payer: No Typology Code available for payment source | Source: Ambulatory Visit | Attending: Family Medicine | Admitting: Family Medicine

## 2023-01-13 DIAGNOSIS — I422 Other hypertrophic cardiomyopathy: Secondary | ICD-10-CM | POA: Insufficient documentation

## 2023-01-13 DIAGNOSIS — E7800 Pure hypercholesterolemia, unspecified: Secondary | ICD-10-CM | POA: Insufficient documentation

## 2023-01-13 DIAGNOSIS — R921 Mammographic calcification found on diagnostic imaging of breast: Secondary | ICD-10-CM | POA: Insufficient documentation

## 2023-02-10 ENCOUNTER — Telehealth: Payer: Self-pay | Admitting: Family Medicine

## 2023-02-10 NOTE — Telephone Encounter (Signed)
Pt would like to update her chart that she got her covid and flu shot at [facility] on [facility] on 01/28/23

## 2023-02-13 ENCOUNTER — Encounter: Payer: Self-pay | Admitting: *Deleted

## 2023-03-23 ENCOUNTER — Encounter: Payer: Self-pay | Admitting: Internal Medicine

## 2023-03-23 ENCOUNTER — Ambulatory Visit: Payer: No Typology Code available for payment source | Attending: Internal Medicine | Admitting: Internal Medicine

## 2023-03-23 VITALS — BP 136/84 | HR 69 | Ht 62.0 in | Wt 171.4 lb

## 2023-03-23 DIAGNOSIS — I422 Other hypertrophic cardiomyopathy: Secondary | ICD-10-CM | POA: Diagnosis not present

## 2023-03-23 DIAGNOSIS — E669 Obesity, unspecified: Secondary | ICD-10-CM

## 2023-03-23 DIAGNOSIS — I471 Supraventricular tachycardia, unspecified: Secondary | ICD-10-CM | POA: Diagnosis not present

## 2023-03-23 DIAGNOSIS — I10 Essential (primary) hypertension: Secondary | ICD-10-CM

## 2023-03-23 DIAGNOSIS — Z6831 Body mass index (BMI) 31.0-31.9, adult: Secondary | ICD-10-CM

## 2023-03-23 MED ORDER — LOSARTAN POTASSIUM 50 MG PO TABS: 50.0000 mg | ORAL_TABLET | Freq: Every day | ORAL | 3 refills | Status: DC

## 2023-03-23 MED ORDER — METOPROLOL SUCCINATE ER 25 MG PO TB24: 25.0000 mg | ORAL_TABLET | Freq: Every day | ORAL | 3 refills | Status: DC

## 2023-03-23 NOTE — Progress Notes (Signed)
OFFICE NOTE  Chief Complaint:  Routine followup  Primary Care Physician: Joselyn Arrow, MD  HPI:  Alicia Raymond is a 67 year old female who has a history of apical hypertrophic cardiomyopathy as well as mild hypertension, rheumatoid arthritis and lupus. Overall, those connective tissue disorders have been very well controlled and she has been on losartan, which is hopefully going to be helpful with providing her some prevention from apical hypertrophy. Over the past year, she has stopped exercising, and gained a small amount of weight. She has had a couple of episodes of chest discomfort at night over the past year however they have subsided.  I had the pleasure of seeing [name] back in the office today. Overall she's done very well over the past year. She started doing significant exercise almost every morning. Her weight is down now more than 15 pounds to 153. Blood pressure is well controlled at 118/70. She remains on losartan, mostly for apical hypertrophic cardiomyopathy. She's had no chest pain or worsening shortness of breath. She reports her rheumatoid arthritis is better controlled in fact she is on a lower dose of Plaquenil.  01/08/2016  Alicia Raymond returns today for follow-up. She feels that she is doing really well. She lost another 3 pounds and denies any chest pain or worsening shortness of breath. Unfortunately her sister is quite ill in [place] and she is under a lot of stress as she's been traveling and visiting with her. Blood pressure appears excellent today. Her last echocardiogram was in 2011 and has not been reassessed since that time. EKG shows sinus rhythm with LVH and T-wave changes apically and anterolaterally consistent with her prior diagnosis of apical hypertrophic cardiomyopathy. With regards to her RA and lupus those have been well controlled on low-dose plaquenil.  04/17/2017  Alicia Raymond was seen today as an add-on for "heart pounding".  She reports  over the past week she has felt like her heart was racing or pounding.  She says it occasionally is associated with discomfort in the chest and some numbness in her left arm.  Afterward she feels fatigued.  It is not worse with exertion or relieved by rest, and she is continued to be able to do exercises on a treadmill.  She says it comes on randomly and is associated with fatigue.  She says that her husband listened to her heart and noted that it was pounding harder and faster than she remembered.  05/22/2017  Alicia Raymond returns today for follow-up of her monitor results.  This did not show any extrasystoles or arrhythmias that would be associated with her symptoms of palpitations.  She says they seem to be worse when having disagreements with her husband and I suspect they are related to stress.  She has had a little physical responsibilities over the past week and has felt much better.  03/07/2018  Alicia Raymond returns today for follow-up.  She wore a monitor in November which demonstrated no arrhythmias to explain her symptoms.  She does have a history of tachypalpitations.  Recently she was in the emergency department and an EKG was able to capture an SVT with heart rate into the 160s.  I was notified by the ER physician and recommended starting her on Toprol-XL 25 mg daily.  She reports taking the medicine although feels like she might be a little fatigue.  In general though her energy is much better and she says she has had significant improvement in her palpitations.  She took a week vacation  with her husband and and they went to the beach.  She was able to walk long distances and was a lot more active without having to stop and she was previously.  06/07/2018  Alicia Raymond was seen today in follow-up.  Overall she is doing well on Toprol.  She denies any recurrent SVT.  She has no concerning palpitations.  She denies chest pain or worsening shortness of breath.  She started walking more recently about 3 miles a day.   She still battling the weight somewhat however does report eating fairly healthy diet.  06/18/2019  Alicia Raymond is seen today for annual follow-up.  Overall she reports good control of her palpitations although she occasionally has some brief breakthroughs.  It does not seem that she has enough episodes to warrant an increase in her medication.  She has no other symptoms related to apical hypertrophic cardiomyopathy.  She reports some occasional dizziness.  Blood pressure is well controlled today.  She continues to exercise regularly.  She is disappointed that she is not recently lost any weight because she says she is exercising for 40 to 60 minutes with both aerobic and resistance training, 3 times a day.  I thought this might be a little too much, but if she persists, I expect that she may eventually lose weight.  07/16/2020  Alicia Raymond is seen today in follow-up.  She saw Marjie Skiff in October 2021 for tightness across her back and shortness of breath with palpitations.  She wore a monitor which showed some occasional SVT.  Her beta-blocker was increased to 25 mg daily with improvement in her palpitations.  A repeat echo showed normal function with apical hypertrophic cardiomyopathy.  This was already known.  Today she says her main issue over the past several nights is again tightness across her upper back.  Usually occurs when she goes to bed and she feels stressed or may have had an argument prior to that.  The symptoms are relieved somewhat by deep tissue massage.  03/23/2023  Alicia Raymond is seen today for follow-up.  She says she denies any recurrent palpitations or tachycardia.  She is felt well without any worsening shortness of breath or chest pain.  Her main concern today is inability to lose weight.  Currently her BMI is 31 and she does have treated hypertension.  She inquired about different methods to help her with her weight loss that she already is exercising regularly and has significantly cut portions  in her diet.  PMHx:  Past Medical History:  Diagnosis Date   Apical variant hypertrophic cardiomyopathy (HCC)    Dr. 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 Rennis Golden   Osteoarthritis    Dr. Corliss Skains (NOT rheumatoid)   PSVT (paroxysmal supraventricular tachycardia) (HCC)     Past Surgical History:  Procedure Laterality Date   BREAST BIOPSY Right 1988   fibroadenoma (benign)   CARDIAC CATHETERIZATION  04/12/2010   LAD with 30% narrowing - minimal CAD (Dr. Ellene Route)   TRANSTHORACIC ECHOCARDIOGRAM  03/2010   EF=>55%, apical hypertrophy, mod conc LVH; mild MR; trace TR with normal RVSP   TUBAL LIGATION      FAMHx:  Family History  Problem Relation Age of Onset   Hypertension Mother    Lung cancer Father    Cancer Father    Other Sister        brain tumor (benign)  Cancer Sister        stomach cancer   Cancer Sister 26       metastatic (in abdomen), unknown primary, died 2 mos later   COPD Brother    Hyperlipidemia Brother    Supraventricular tachycardia Daughter    Hyperlipidemia Son    Diabetes Neg Hx    Colon cancer Neg Hx    Breast cancer Neg Hx     SOCHx:   reports that she has never smoked. She has never used smokeless tobacco. She reports current alcohol use. She reports that she does not use drugs.  ALLERGIES:  Allergies  Allergen Reactions   Septra [Bactrim] Swelling    Couldn't breathe, lip swelling    ROS: Pertinent items noted in HPI and remainder of comprehensive ROS otherwise negative.  HOME MEDS: Current Outpatient Medications  Medication Sig Dispense Refill   b complex vitamins tablet Take 1 tablet by mouth daily.     CALCIUM PO Take 1 tablet by mouth daily.      Cholecalciferol (VITAMIN D PO) Take 1 capsule by mouth daily.      hydroxychloroquine (PLAQUENIL) 200 MG tablet Take 1 tablet (200 mg total) by mouth daily. 30 tablet 0   losartan (COZAAR) 50 MG tablet Take 1 tablet  (50 mg total) by mouth daily. 90 tablet 1   metoprolol succinate (TOPROL-XL) 25 MG 24 hr tablet Take 1 tablet (25 mg total) by mouth daily. 90 tablet 3   Omega-3 Fatty Acids (FISH OIL) 1000 MG CAPS Take 1 capsule by mouth daily.     No current facility-administered medications for this visit.    LABS/IMAGING: No results found for this or any previous visit (from the past 48 hour(s)). No results found.  VITALS: BP 136/84 (BP Location: Left Arm, Patient Position: Sitting, Cuff Size: Normal)   Pulse 69   Ht 5\' 2"  (1.575 m)   Wt 171 lb 6.4 oz (77.7 kg)   SpO2 99%   BMI 31.35 kg/m   EXAM: General appearance: alert and no distress Neck: no carotid bruit, no JVD and thyroid not enlarged, symmetric, no tenderness/mass/nodules Lungs: clear to auscultation bilaterally Heart: regular rate and rhythm, S1, S2 normal, no murmur, click, rub or gallop Abdomen: soft, non-tender; bowel sounds normal; no masses,  no organomegaly Extremities: extremities normal, atraumatic, no cyanosis or edema Pulses: 2+ and symmetric Skin: Skin color, texture, turgor normal. No rashes or lesions Neurologic: Grossly normal : Pleasant  EKG: EKG Interpretation Date/Time:  Thursday March 23 2023 10:39:44 EDT Ventricular Rate:  69 PR Interval:  144 QRS Duration:  96 QT Interval:  442 QTC Calculation: 473 R Axis:   43  Text Interpretation: Normal sinus rhythm Left ventricular hypertrophy with repolarization abnormality ( R in aVL , Sokolow-Lyon , Romhilt-Estes ) No significant change since last tracing Confirmed by Zoila Shutter [phone]) on 03/23/2023 11:02:46 AM    ASSESSMENT: Obesity, BMI 31.5 Musculoskeletal upper back pain PSVT - improved on BB Apical variant hypertrophic cardiomyopathy Hypertension Rheumatoid arthritis-followed by rheumatologist  PLAN: 1.   Alicia Raymond has done well without recent recurrence of SVT.  She is inquired about options for weight loss.  I think she may be a candidate for a  GLP-1 agonist.  She is interested in proceeding with that.  Will reach out to our pharmacy team to see if they can assist in obtaining prior authorization for this.  Otherwise plan follow-up with me annually or sooner as necessary.  Chrystie Nose, MD, [facility],  [facility]  [facility]  Medical Director of the Advanced Lipid Disorders &  Cardiovascular Risk Reduction Clinic Attending Cardiologist  Direct Dial: [phone]  Fax: [phone]  Website:  [email or web address]  Lisette Abu Coralee Edberg 03/23/2023, 11:02 AM

## 2023-03-23 NOTE — Patient Instructions (Signed)
Medication Instructions:  NO CHANGES *If you need a refill on your cardiac medications before your next appointment, please call your pharmacy*   Follow-Up: At [facility], you and your health needs are our priority.  As part of our continuing mission to provide you with exceptional heart care, we have created designated Provider Care Teams.  These Care Teams include your primary Cardiologist (physician) and Advanced Practice Providers (APPs -  Physician Assistants and Nurse Practitioners) who all work together to provide you with the care you need, when you need it.  We recommend signing up for the patient portal called "MyChart".  Sign up information is provided on this After Visit Summary.  MyChart is used to connect with patients for Virtual Visits (Telemedicine).  Patients are able to view lab/test results, encounter notes, upcoming appointments, etc.  Non-urgent messages can be sent to your provider as well.   To learn more about what you can do with MyChart, go to [email or web address].    12 months with Dr. Rennis Golden   Other Instructions You have been referred to our pharmacy team for weight management evaluation

## 2023-04-04 DIAGNOSIS — Z6831 Body mass index (BMI) 31.0-31.9, adult: Secondary | ICD-10-CM | POA: Diagnosis not present

## 2023-04-04 DIAGNOSIS — I7300 Raynaud's syndrome without gangrene: Secondary | ICD-10-CM | POA: Diagnosis not present

## 2023-04-04 DIAGNOSIS — L930 Discoid lupus erythematosus: Secondary | ICD-10-CM | POA: Diagnosis not present

## 2023-04-04 DIAGNOSIS — M1991 Primary osteoarthritis, unspecified site: Secondary | ICD-10-CM | POA: Diagnosis not present

## 2023-04-04 DIAGNOSIS — E669 Obesity, unspecified: Secondary | ICD-10-CM | POA: Diagnosis not present

## 2023-04-04 DIAGNOSIS — Z79899 Other long term (current) drug therapy: Secondary | ICD-10-CM | POA: Diagnosis not present

## 2023-04-12 NOTE — Progress Notes (Unsigned)
No chief complaint on file.   Alicia Raymond is a 67 y.o. female who presents for annual physical exam,  Medicare annual wellness visit, and follow-up on chronic medical conditions.    She is under the care of Dr. Rennis Golden for apical variant hypertrophic cardiomyopathy, mild hypertension and SVT. She last saw cardiology in 01/2022.  She remains on Toprol XL for SVT. She had 1 episode recently where her heart was beating very hard, just for a few beats, occurred 3x that night, none since. She continues on losartan, for HTN and apical hypertrophic cardiomyopathy. She denies any chest pain, lightheadedness, headaches, dizziness, syncope, edema or any side effects to medications.   Arthritis and discoid lupus:  Under the care of Dr. Nickola Major, doing well on Plaquenil. Last seen 09/2021. C-met and CBC ordered per notes. Results not sent to [place] (see below, got faxed over during visit). 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  Didn't have any flares while in [place].  Osteopenia:  last DEXA was 11/2020, T-1.8 at spine. She takes 500mg  of calcium once daily.  She likes almond milk, has 1 glass daily.  No dairy products (gets diarrhea after eating).  She gets weight-bearing exercise regularly.  She takes vitamin D 1000 IU, and thinks that her calcium also has D in it.  Obesity: She is a little frustrated she hasn't lost weight.  She exercises a lot. Only drinks Sprite if she feels she needs something sweet, not on a regular basis (1/month).  No juice, hasn't had wine in months. +Rice, portion is 1/2 cup (recalls being told not to have brown rice due to K+ elevation in the past)..  Denies potatoes, pasta, breads. Protein sources include fish, beef, chicken, reports portions are small. No fried foods, rare fast food. Snacks include fruit (banana, apple, carrots). No sweets or nuts.  Rare ice cream. She eats 2 hard boiled eggs every day. She gets  daily exercise.  Immunization History  Administered Date(s) Administered   Fluad Quad(high Dose 65+) 01/16/2019, 01/31/2022, 01/28/2023   Influenza Inj Mdck Quad Pf 02/09/2018   Influenza Split 04/03/2012, 02/08/2013, 02/12/2014   Influenza, High Dose Seasonal PF 02/13/2020   Influenza-Unspecified 02/13/2015, 02/18/2016, 02/16/2017, 02/16/2021   PFIZER(Purple Top)SARS-COV-2 Vaccination 07/13/2019, 08/07/2019, 02/27/2020, 10/06/2020   Pfizer Covid-19 Vaccine Bivalent Booster 37yrs & up 03/08/2021   Pfizer(Comirnaty)Fall Seasonal Vaccine 12 years and older 03/17/2022, 01/28/2023   Pneumococcal Conjugate-13 12/12/2018   Pneumococcal Polysaccharide-23 02/08/2013, 03/20/2020   Tdap 02/12/2014   Zoster Recombinant(Shingrix) 02/16/2017, 06/23/2017   Zoster, Live 08/26/2014   Last Pap smear: 09/2017, normal, no high risk HPV Last mammogram: 12/2021 (and underwent cyst aspiration L breast) Last colonoscopy: 03/2021 Dr. Chinita Pester serrated polyps, f/u 5 years recommended (Dr. Elnoria Howard).  EGD also done 03/2021 by Dr. Maeola Harman chronic inactive gastritis, negative for H.pylori Last DEXA:  11/2020 T-1.8 spine (prev 11/2016: T-1.7 spine, 09/2014 T-1.6 spine, 12/2012 T-1.3) Dentist: twice yearly Ophtho: yearly Exercise:  She rides recumbent bike 30 minutes daily, aerobics classes through YouTube 30 minutes daily. Able to walk some now, that her knee is feeling better (3-4 miles 5x/week).  Weights at home/work (daily, upper and lower body).  Still does yoga stretches daily.  Vitamin D-OH was 34 in 11/2016 Lipids: Lipids were elevated in 2021, with LDL 145. Improved last year Lab Results  Component Value Date   CHOL 216 (H) 03/17/2022   HDL 91 03/17/2022   LDLCALC 110 (H)  03/17/2022   TRIG 85 03/17/2022   CHOLHDL 2.4 03/17/2022   Thyroid screen: Lab Results  Component Value Date   TSH 1.810 03/17/2022    Patient Care Team: Joselyn Arrow, MD as PCP - General (Family Medicine) Rennis Golden Lisette Abu, MD as  PCP - Cardiology (Cardiology) Zenovia Jordan, MD as Consulting Physician (Rheumatology) Marzella Schlein., MD (Ophthalmology) Jeani Hawking, MD as Consulting Physician (Gastroenterology) Salvatore Marvel, MD as Consulting Physician (Orthopedic Surgery) [facility] [place], [place], [place], [place], [place] as Physician Assistant (Cardiology)  Depression Screening: Flowsheet Row Office Visit from 03/17/2022 in [facility]  PHQ-2 Total Score 0       Falls screen:     03/17/2022    8:31 AM 03/10/2021    8:42 AM 10/29/2020   12:09 PM 02/27/2020    9:49 AM 12/12/2018    8:47 AM  Fall Risk   Falls in the past year? 0 0 0 0 0  Number falls in past yr: 0 0 0    Injury with Fall? 0 0 0    Risk for fall due to : No Fall Risks No Fall Risks No Fall Risks    Follow up Falls evaluation completed Falls evaluation completed Falls evaluation completed       Functional Status Survey:         End of Life Discussion:  Patient does not have a living will and medical power of attorney.  Given new forms last year, hasn't completed yet that she can recall.   PMH, PSH, SH and FH were reviewed and updated  Outpatient Encounter Medications as of 04/13/2023  Medication Sig Note   b complex vitamins tablet Take 1 tablet by mouth daily.    CALCIUM PO Take 1 tablet by mouth daily.  03/10/2021: Unsure of dose, thinks 500mg , thinks it might also have D, will check her bottle   Cholecalciferol (VITAMIN D PO) Take 1 capsule by mouth daily.  02/27/2020: 1000 IU   hydroxychloroquine (PLAQUENIL) 200 MG tablet Take 1 tablet (200 mg total) by mouth daily.    losartan (COZAAR) 50 MG tablet Take 1 tablet (50 mg total) by mouth daily.    metoprolol succinate (TOPROL-XL) 25 MG 24 hr tablet Take 1 tablet (25 mg total) by mouth daily.    Omega-3 Fatty Acids (FISH OIL) 1000 MG CAPS Take 1 capsule by mouth daily.    No facility-administered encounter medications on file as of 04/13/2023.   Allergies   Allergen Reactions   Septra [Bactrim] Swelling    Couldn't breathe, lip swelling   ROS:  The patient denies anorexia, fever, vision changes, decreased hearing, ear pain, sore throat, breast concerns, chest pain, dizziness, syncope, dyspnea on exertion, cough, swelling, nausea, vomiting, diarrhea, constipation, abdominal pain, melena, hematochezia, indigestion/heartburn, hematuria, incontinence, dysuria, postmenopausal bleeding, vaginal discharge, odor or itch, genital lesions, weakness, tremor, suspicious skin lesions, abnormal bleeding/bruising, or enlarged lymph nodes. Ringing in her right ear (intermittent); denies hearing loss. Unchanged. Decreased libido, some vaginal dryness and discomfort. No longer sexually active. Notices  some hair loss/thinning.  Having trouble losing weight despite trying hard. No changes to nails, bowels, skin or moods. Palpitations recently, per HPI. No rashes or flares of discoid lupus. Knee pain, improved after injections.  Mainly has issues with R knee, doing well currently. L foot pain resolved (no longer takes gabapentin, rx'd by podiatrist). She had a cough for weeks last month, resolved.    PHYSICAL EXAM:  There were no vitals  taken for this visit.  Wt Readings from Last 3 Encounters:  03/23/23 171 lb 6.4 oz (77.7 kg)  03/17/22 168 lb 9.6 oz (76.5 kg)  02/01/22 170 lb (77.1 kg)   General Appearance:     Alert, cooperative, no distress, appears stated age   Head:     Normocephalic, without obvious abnormality, atraumatic   Eyes:     PERRL, conjunctiva/corneas clear, EOM's intact, fundi benign   Ears:     Normal TM's and external ear canals   Nose:    No drainage or sinus tenderness  Throat:    Normal mucosa  Neck:    Supple, no lymphadenopathy;  thyroid:  no enlargement/ tenderness/nodules; no carotid bruit or JVD   Back:     Spine nontender, no curvature, ROM normal, no CVA tenderness   Lungs:      Clear to auscultation bilaterally without  wheezes, rales or ronchi; respirations unlabored   Chest Wall:     No tenderness or deformity    Heart:     Regular rate and rhythm, S1 and S2 normal, no murmur, rub or gallop   Breast Exam:     No tenderness, masses, or nipple discharge or inversion. No axillary lymphadenopathy.  WHSS right upper breast   Abdomen:      Soft, non-tender, nondistended, normoactive bowel sounds, no masses, no hepatosplenomegaly   Genitalia:     Normal external genitalia without lesions.  BUS and vagina normal, some atrophic changes; no cervical motion tenderness. No abnormal vaginal discharge.  Uterus and adnexa not enlarged, nontender, no masses.  Pap not performed   Rectal:     Normal tone, no masses or tenderness; heme negative stool  Extremities:    No clubbing, cyanosis or edema.   Pulses:    1+ and symmetric all extremities   Skin:    Skin color, texture, turgor normal, no rashes or lesions.  Lymph nodes:    Cervical, supraclavicular, and axillary nodes normal   Neurologic:    Normal strength, gait; reflexes 2+ and symmetric throughout.                     Psych:   Normal mood, affect, hygiene and grooming   ASSESSMENT/PLAN:   Lab results done through Dr. Nickola Major office 04/04/23: WBC 4.4 3.4-10.8 x10E3/uL    RBC 4.54 3.77-5.28 x10E6/uL    Hemoglobin 13.4 11.1-15.9 g/dL    Hematocrit 69.6 29.5-28.4 %    MCV 94 79-97 fL    MCH 29.5 26.6-33.0 pg    MCHC 31.3 31.5-35.7 g/dL    RDW 13.2 44.0-10.2 %    Platelets 251 150-450 x10E3/uL    Glucose 89 70-99 mg/dL    BUN 14 7-25 mg/dL    Creatinine 3.66 4.40-3.47 mg/dL    eGFR 80 >42 VZ/DGL/8.75    BUN/Creatinine Ratio 18 12-28    Sodium 141 134-144 mmol/L    Potassium 4.2 3.5-5.2 mmol/L    Chloride 104 96-106 mmol/L    Carbon Dioxide, Total 24 20-29 mmol/L    Calcium 9.5 8.7-10.3 mg/dL    Protein, Total 7.0 6.4-3.3 g/dL    Albumin 4.1 2.9-5.1 g/dL    Globulin, Total 2.9 1.5-4.5 g/dL    Bilirubin, Total 0.4 0.0-1.2 mg/dL    Alkaline Phosphatase 63  44-121 IU/L    AST (SGOT) 23 0-40 IU/L    ALT (SGPT) 15 0-32 IU/L    Lipids, c-met TSH  Discussed monthly self breast exams and yearly mammograms; at  least 30 minutes of aerobic activity at least 5 days/week, weight-bearing exercise at least 2x/wk; proper sunscreen use reviewed; healthy diet, including goals of calcium and vitamin D intake and alcohol recommendations (less than or equal to 1 drink/day) reviewed; regular seatbelt use; changing batteries in smoke detectors. Immunizations--COVID booster given. Continue yearly high dose flu shots. RSV vaccine recommended from pharmacy. DEXA due again 11/2022    Discussed living will, healthcare power of attorney, and advised to get [place] copy of notarized forms to be scanned into her chart.  New forms given.   MOST form completed, full code, full care   F/u 1 year, sooner prn.   Medicare Attestation I have personally reviewed: The patient's medical and social history Their use of alcohol, tobacco or illicit drugs Their current medications and supplements The patient's functional ability including ADLs,fall risks, home safety risks, cognitive, and hearing and visual impairment Diet and physical activities Evidence for depression or mood disorders  The patient's weight, height, BMI have been recorded in the chart.  I have made referrals, counseling, and provided education to the patient based on review of the above and I have provided the patient with a written personalized care plan for preventive services.

## 2023-04-13 ENCOUNTER — Other Ambulatory Visit (HOSPITAL_COMMUNITY)
Admission: RE | Admit: 2023-04-13 | Discharge: 2023-04-13 | Disposition: A | Discharge status: Home / Self Care | Payer: No Typology Code available for payment source | Source: Ambulatory Visit | Attending: Family Medicine | Admitting: Family Medicine

## 2023-04-13 ENCOUNTER — Ambulatory Visit: Payer: No Typology Code available for payment source | Admitting: Family Medicine

## 2023-04-13 ENCOUNTER — Encounter: Payer: Self-pay | Admitting: Family Medicine

## 2023-04-13 VITALS — BP 124/78 | HR 68 | Ht 62.0 in | Wt 169.8 lb

## 2023-04-13 DIAGNOSIS — K644 Residual hemorrhoidal skin tags: Secondary | ICD-10-CM | POA: Diagnosis not present

## 2023-04-13 DIAGNOSIS — Z23 Encounter for immunization: Secondary | ICD-10-CM

## 2023-04-13 DIAGNOSIS — K5900 Constipation, unspecified: Secondary | ICD-10-CM

## 2023-04-13 DIAGNOSIS — E785 Hyperlipidemia, unspecified: Secondary | ICD-10-CM | POA: Diagnosis not present

## 2023-04-13 DIAGNOSIS — Z01419 Encounter for gynecological examination (general) (routine) without abnormal findings: Secondary | ICD-10-CM | POA: Insufficient documentation

## 2023-04-13 DIAGNOSIS — M8588 Other specified disorders of bone density and structure, other site: Secondary | ICD-10-CM

## 2023-04-13 DIAGNOSIS — Z0000 Encounter for general adult medical examination without abnormal findings: Secondary | ICD-10-CM | POA: Insufficient documentation

## 2023-04-13 DIAGNOSIS — E559 Vitamin D deficiency, unspecified: Secondary | ICD-10-CM

## 2023-04-13 DIAGNOSIS — L659 Nonscarring hair loss, unspecified: Secondary | ICD-10-CM

## 2023-04-13 DIAGNOSIS — Z1151 Encounter for screening for human papillomavirus (HPV): Secondary | ICD-10-CM | POA: Insufficient documentation

## 2023-04-13 DIAGNOSIS — E7800 Pure hypercholesterolemia, unspecified: Secondary | ICD-10-CM | POA: Diagnosis not present

## 2023-04-13 DIAGNOSIS — L853 Xerosis cutis: Secondary | ICD-10-CM

## 2023-04-13 DIAGNOSIS — I422 Other hypertrophic cardiomyopathy: Secondary | ICD-10-CM

## 2023-04-13 DIAGNOSIS — I10 Essential (primary) hypertension: Secondary | ICD-10-CM | POA: Diagnosis not present

## 2023-04-13 DIAGNOSIS — L930 Discoid lupus erythematosus: Secondary | ICD-10-CM

## 2023-04-13 MED ORDER — HYDROCORTISONE (PERIANAL) 2.5 % EX CREA
TOPICAL_CREAM | CUTANEOUS | 1 refills | Status: AC
Start: 2023-04-13 — End: ?

## 2023-04-13 NOTE — Patient Instructions (Addendum)
HEALTH MAINTENANCE RECOMMENDATIONS:  It is recommended that you get at least 30 minutes of aerobic exercise at least 5 days/week (for weight loss, you may need as much as 60-90 minutes). This can be any activity that gets your heart rate up. This can be divided in 10-15 minute intervals if needed, but try and build up your endurance at least once a week.  Weight bearing exercise is also recommended twice weekly.  Eat a healthy diet with lots of vegetables, fruits and fiber.  "Colorful" foods have a lot of vitamins (ie green vegetables, tomatoes, red peppers, etc).  Limit sweet tea, regular sodas and alcoholic beverages, all of which has a lot of calories and sugar.  Up to 1 alcoholic drink daily may be beneficial for women (unless trying to lose weight, watch sugars).  Drink a lot of water.  Calcium recommendations are 1200-1500 mg daily (1500 mg for postmenopausal women or women without ovaries), and vitamin D 1000 IU daily.  This should be obtained from diet and/or supplements (vitamins), and calcium should not be taken all at once, but in divided doses.  Monthly self breast exams and yearly mammograms for women over the age of 49 is recommended.  Sunscreen of at least SPF 30 should be used on all sun-exposed parts of the skin when outside between the hours of 10 am and 4 pm (not just when at beach or pool, but even with exercise, golf, tennis, and yard work!)  Use a sunscreen that says "broad spectrum" so it covers both UVA and UVB rays, and make sure to reapply every 1-2 hours.  Remember to change the batteries in your smoke detectors when changing your clock times in the spring and fall. Carbon monoxide detectors are recommended for your home.  Use your seat belt every time you are in a car, and please drive safely and not be distracted with cell phones and texting while driving.   Alicia Raymond , Thank you for taking time to come for your Medicare Wellness Visit. I appreciate your  ongoing commitment to your health goals. Please review the following plan we discussed and let me know if I can assist you in the future.   This is a list of the screening recommended for you and due dates:  Health Maintenance  Topic Date Due   COVID-19 Vaccine (8 - 2023-24 season) 04/28/2023*   Mammogram  12/27/2023   DTaP/Tdap/Td vaccine (2 - Td or Tdap) 02/13/2024   Medicare Annual Wellness Visit  04/12/2024   Colon Cancer Screening  04/20/2026   Pneumonia Vaccine  Completed   Flu Shot  Completed   DEXA scan (bone density measurement)  Completed   Hepatitis C Screening  Completed   Zoster (Shingles) Vaccine  Completed   HPV Vaccine  Aged Out  *Topic was postponed. The date shown is not the original due date.   RSV vaccine is recommended, please get this from the pharmacy.  You are due for follow-up bone density test.  You can do this now, or can wait until your next mammogram in August. This is to follow the mild thinning, looking for worsening of osteopenia.  Last check was 11/2020. Insurance covers checking this every 2 years.  Your decline from the prior test was only slight, so is fine to wait 3 years if you prefer.  Please bring [place] copies of your Living Will and Healthcare Power of Attorney so that it can be scanned into your medical chart.  Stay well hydrated.  Consider taking an antihistamine as needed (such as claritin or zyrtec)--this can treat allergies and postnasal drainage.  Mucinex (guaifenesin) can also help loosen the secretions and help with cough.  We discussed using hemorrhoid cream as needed for flares. Work on increased fiber and fluids in the diet to prevent constipation, which triggers flares of hemorrhoids.  If you continue to have problems, let us know and we would be happy to refer you to a surgeon for hemorrhoid surgery.. If the [facility] prescription isn't covered, you can try the over-the-counter version (hydrocortisone is a little weaker, but likely will  also work well.)

## 2023-04-14 LAB — LIPID PANEL
Chol/HDL Ratio: 2.2 ratio (ref 0.0–4.4)
Cholesterol, Total: 246 mg/dL — ABNORMAL HIGH (ref 100–199)
HDL: 111 mg/dL (ref >39)
LDL Chol Calc (NIH): 121 mg/dL — ABNORMAL HIGH (ref 0–99)
Triglycerides: 82 mg/dL (ref 0–149)
VLDL Cholesterol Cal: 14 mg/dL (ref 5–40)

## 2023-04-14 LAB — VITAMIN D 25 HYDROXY (VIT D DEFICIENCY, FRACTURES): Vit D, 25-Hydroxy: 25 ng/mL — ABNORMAL LOW (ref 30.0–100.0)

## 2023-04-14 LAB — TSH: TSH: 1.01 u[IU]/mL (ref 0.450–4.500)

## 2023-04-14 MED ORDER — VITAMIN D (ERGOCALCIFEROL) 1.25 MG (50000 UNIT) PO CAPS: 50000.0000 [IU] | ORAL_CAPSULE | ORAL | 0 refills | Status: DC

## 2023-04-14 NOTE — Addendum Note (Signed)
Addended by: Joselyn Arrow on: 04/14/2023 08:36 AM   Modules accepted: Orders

## 2023-04-17 ENCOUNTER — Ambulatory Visit: Payer: No Typology Code available for payment source | Attending: Cardiology | Admitting: Pharmacist

## 2023-04-17 VITALS — Ht 61.5 in | Wt 171.0 lb

## 2023-04-17 DIAGNOSIS — Z6831 Body mass index (BMI) 31.0-31.9, adult: Secondary | ICD-10-CM | POA: Diagnosis not present

## 2023-04-17 DIAGNOSIS — I10 Essential (primary) hypertension: Secondary | ICD-10-CM

## 2023-04-17 DIAGNOSIS — Z131 Encounter for screening for diabetes mellitus: Secondary | ICD-10-CM | POA: Diagnosis not present

## 2023-04-17 DIAGNOSIS — R0789 Other chest pain: Secondary | ICD-10-CM

## 2023-04-17 NOTE — Progress Notes (Signed)
Patient ID: Alicia Raymond                 DOB: Sep 20, 1955                    MRN: [id number]

## 2023-04-17 NOTE — Patient Instructions (Addendum)
It was nice meeting you today  The medications we discussed today are called Wegovy and Zepbound. Both are given once weekly  I will complete the prior authorizations for you and let you know the response  We will also update your hemoglobin A1c today to verify you do not have diabetes  Please let us know if you have any questions  Laural Golden, PharmD, BCACP, CDCES, CPP [address] [place], [place], [address] Phone: [phone] , Fax: [phone]

## 2023-04-18 LAB — CYTOLOGY - PAP
Comment: NEGATIVE
Diagnosis: NEGATIVE
High risk HPV: NEGATIVE

## 2023-04-18 LAB — HEMOGLOBIN A1C
Est. average glucose Bld gHb Est-mCnc: 111 mg/dL
Hgb A1c MFr Bld: 5.5 % (ref 4.8–5.6)

## 2023-05-16 DIAGNOSIS — L930 Discoid lupus erythematosus: Secondary | ICD-10-CM | POA: Diagnosis not present

## 2023-05-16 DIAGNOSIS — N182 Chronic kidney disease, stage 2 (mild): Secondary | ICD-10-CM | POA: Diagnosis not present

## 2023-05-16 DIAGNOSIS — E663 Overweight: Secondary | ICD-10-CM | POA: Diagnosis not present

## 2023-05-16 DIAGNOSIS — Z008 Encounter for other general examination: Secondary | ICD-10-CM | POA: Diagnosis not present

## 2023-05-16 DIAGNOSIS — Z6829 Body mass index (BMI) 29.0-29.9, adult: Secondary | ICD-10-CM | POA: Diagnosis not present

## 2023-05-16 DIAGNOSIS — E785 Hyperlipidemia, unspecified: Secondary | ICD-10-CM | POA: Diagnosis not present

## 2023-05-16 DIAGNOSIS — I129 Hypertensive chronic kidney disease with stage 1 through stage 4 chronic kidney disease, or unspecified chronic kidney disease: Secondary | ICD-10-CM | POA: Diagnosis not present

## 2023-05-30 ENCOUNTER — Other Ambulatory Visit: Payer: Self-pay | Admitting: Rheumatology

## 2023-05-30 DIAGNOSIS — L930 Discoid lupus erythematosus: Secondary | ICD-10-CM

## 2023-07-06 ENCOUNTER — Other Ambulatory Visit: Payer: Self-pay | Admitting: Family Medicine

## 2023-07-06 DIAGNOSIS — E559 Vitamin D deficiency, unspecified: Secondary | ICD-10-CM

## 2023-08-19 ENCOUNTER — Other Ambulatory Visit: Payer: Self-pay | Admitting: Rheumatology

## 2023-08-19 DIAGNOSIS — L930 Discoid lupus erythematosus: Secondary | ICD-10-CM

## 2023-10-02 NOTE — Progress Notes (Unsigned)
 No chief complaint on file.  Painful lump near anus    Last colonoscopy 03/2021, polyps and diverticulosis.  7 yr f/u recommended (Dr. Nickey Barn).  No mention of hemorrhoids.    PMH, PSH, SH reviewed   ROS:    PHYSICAL EXAM:  There were no vitals taken for this visit.  Wt Readings from Last 3 Encounters:  04/17/23 171 lb (77.6 kg)  04/13/23 169 lb 12.8 oz (77 kg)  03/23/23 171 lb 6.4 oz (77.7 kg)       ASSESSMENT/PLAN:

## 2023-10-03 ENCOUNTER — Encounter: Payer: Self-pay | Admitting: Family Medicine

## 2023-10-03 ENCOUNTER — Ambulatory Visit (INDEPENDENT_AMBULATORY_CARE_PROVIDER_SITE_OTHER): Admitting: Family Medicine

## 2023-10-03 VITALS — BP 122/70 | HR 64 | Temp 98.0°F | Ht 62.0 in | Wt 167.8 lb

## 2023-10-03 DIAGNOSIS — R059 Cough, unspecified: Secondary | ICD-10-CM | POA: Diagnosis not present

## 2023-10-03 DIAGNOSIS — K644 Residual hemorrhoidal skin tags: Secondary | ICD-10-CM | POA: Diagnosis not present

## 2023-10-03 DIAGNOSIS — J302 Other seasonal allergic rhinitis: Secondary | ICD-10-CM

## 2023-10-03 MED ORDER — AZITHROMYCIN 250 MG PO TABS: ORAL_TABLET | ORAL | 0 refills | Status: DC

## 2023-10-03 MED ORDER — FLUTICASONE PROPIONATE 50 MCG/ACT NA SUSP: 2.0000 | Freq: Every day | NASAL | 6 refills | Status: DC

## 2023-10-03 NOTE — Patient Instructions (Addendum)
 Your lungs are clear. Your nose shows congestion consistent with allergies. Restart using zyrtec daily (for at least a week). Start using the fluticasone nasal spray--remember to use gentle sniffs only, and use 2 sniffs into each nostril, once daily.  This can take up to a week or more to have its full effect, so continue to use it. I also would like for you to resume the Mucinex DM 12 hour tablet, to help loosen up the phlegm that is getting hung up in the back of the throat, and to help with your cough. It is important to stay well hydrated, to keep the mucus thin (drink plenty of water). If you develop a fever, or if you continue to notice consistently discolored mucus or phlegm, start the antibiotic. You often can see some discoloration first thing in the morning. If noticing it throughout the day, after being on these medications for 5-7 days, then start the antibiotic.  You take the azithromycin for 5 days, but it works for a full 10 days. Contact us  after 10 days if you aren't better.  Your lump that you are feeling is likely a very small clot (thrombosis) within a hemorrhoid. Warm compresses to the area should help this continue to shrink and feel better. You can continue to use the Anusol  HC cream as needed.

## 2023-10-10 DIAGNOSIS — M25561 Pain in right knee: Secondary | ICD-10-CM | POA: Diagnosis not present

## 2023-10-10 DIAGNOSIS — Z6830 Body mass index (BMI) 30.0-30.9, adult: Secondary | ICD-10-CM | POA: Diagnosis not present

## 2023-10-10 DIAGNOSIS — L930 Discoid lupus erythematosus: Secondary | ICD-10-CM | POA: Diagnosis not present

## 2023-10-10 DIAGNOSIS — I7300 Raynaud's syndrome without gangrene: Secondary | ICD-10-CM | POA: Diagnosis not present

## 2023-10-10 DIAGNOSIS — Z79899 Other long term (current) drug therapy: Secondary | ICD-10-CM | POA: Diagnosis not present

## 2023-10-10 DIAGNOSIS — M1991 Primary osteoarthritis, unspecified site: Secondary | ICD-10-CM | POA: Diagnosis not present

## 2023-10-10 DIAGNOSIS — E669 Obesity, unspecified: Secondary | ICD-10-CM | POA: Diagnosis not present

## 2023-10-20 LAB — LAB REPORT - SCANNED: EGFR: 88

## 2023-11-02 ENCOUNTER — Ambulatory Visit: Payer: Self-pay | Admitting: Family Medicine

## 2023-11-02 ENCOUNTER — Telehealth: Payer: Self-pay | Admitting: Family Medicine

## 2023-11-02 NOTE — Telephone Encounter (Signed)
 FYI Only or Action Required?: Action required by provider  Patient was last seen in primary care on 10/03/2023 by Roosvelt Colla, MD. Called Nurse Triage reporting Advice Only.   Triage Disposition: Call PCP When Office is Open  Patient/caregiver understands and will follow disposition?: Yes       Copied from CRM [id number]. Topic: Clinical - Lab/Test Results >> Nov 02, 2023 10:37 AM Donee H wrote: Reason for CRM: Patient is stating would like to know more about her lab results from test that was done on May 20 with Rheumatology. Patient states she never received a call and there are no further instructions via Mychart. Reason for Disposition  [1] Caller requesting NON-URGENT health information AND [2] PCP's office is the best resource  Answer Assessment - Initial Assessment Questions 1. REASON FOR CALL or QUESTION: What is your reason for calling today? or How can I best help you? or What question do you have that I can help answer?     Patient is wondering if Dr. Monnie Anthony has reviewed her lab results from the rheumatologist on 5/20. Patient requests a call back from clinic to discuss Dr. Chase Copping interpretation and recommendation. Patient call back number is [phone].  Protocols used: Information Only Call - No Triage-A-AH

## 2023-11-02 NOTE — Telephone Encounter (Unsigned)
 Copied from CRM [id number]. Topic: Clinical - Lab/Test Results >> Nov 02, 2023 10:37 AM Donee H wrote: Reason for CRM: Patient is stating would like to know more about her lab results from test that was done on May 20 with Rheumatology. Patient states she never received a call and there are no further instructions via Mychart.

## 2023-11-02 NOTE — Telephone Encounter (Signed)
 Patient advised.

## 2023-11-02 NOTE — Telephone Encounter (Signed)
 The labs were done by Dr. Alfreida Inches because they were scanned in. She should have heard about the results directly from Dr. Meredith Stalls.  It showed mild elevation in liver tests, and she wanted to have them repeated in a month.  She should contact her rheumatologist's office for further information and to arrange for the follow-up labs.

## 2023-11-06 ENCOUNTER — Telehealth: Payer: Self-pay | Admitting: Internal Medicine

## 2023-11-06 NOTE — Telephone Encounter (Signed)
 Nothing is noted in chart from today about a message. I re-relayed the message from 6/12 that veronica gave her about following up with Rheumatology about labs.   Copied from CRM [id number]. Topic: General - Call Back - No Documentation >> Nov 06, 2023  1:01 PM [name] wrote: Reason for CRM: Patient states she got an email and a test message today advising her to contact her PCP. Patient would like a call back to discuss message.  Patient can be reached at [phone]

## 2023-11-16 DIAGNOSIS — R7989 Other specified abnormal findings of blood chemistry: Secondary | ICD-10-CM | POA: Diagnosis not present

## 2023-11-16 DIAGNOSIS — R7982 Elevated C-reactive protein (CRP): Secondary | ICD-10-CM | POA: Diagnosis not present

## 2023-12-28 ENCOUNTER — Telehealth: Payer: Self-pay | Admitting: Family Medicine

## 2023-12-28 DIAGNOSIS — L930 Discoid lupus erythematosus: Secondary | ICD-10-CM

## 2023-12-28 DIAGNOSIS — M8589 Other specified disorders of bone density and structure, multiple sites: Secondary | ICD-10-CM | POA: Diagnosis not present

## 2023-12-28 DIAGNOSIS — Z1231 Encounter for screening mammogram for malignant neoplasm of breast: Secondary | ICD-10-CM | POA: Diagnosis not present

## 2023-12-28 LAB — HM DEXA SCAN

## 2023-12-28 LAB — HM MAMMOGRAPHY

## 2023-12-28 NOTE — Telephone Encounter (Signed)
 Copied from CRM [id number]. Topic: Referral - Request for Referral >> Dec 28, 2023  3:41 PM Kevelyn M wrote: Did the patient discuss referral with their provider in the last year? Yes (If No - schedule appointment) (If Yes - send message)  Appointment offered? No  Type of order/referral and detailed reason for visit: Rheumatology  Preference of office, provider, location: Dr. Rico Gowers  If referral order, have you been seen by this specialty before? Yes (If Yes, this issue or another issue? When? Where?  Can we respond through MyChart? Yes

## 2023-12-28 NOTE — Telephone Encounter (Signed)
 I don't recall discussing changing from Dr. Lavell to Dr. Dolphus.  Just verify reason for change so we have it documented in chart, and okay to refer. Thanks

## 2023-12-29 NOTE — Telephone Encounter (Signed)
 Dr. Lavell doesn't take patient's insuraance and needs to switch .  Referral placed

## 2024-01-01 ENCOUNTER — Ambulatory Visit: Payer: Self-pay | Admitting: Family Medicine

## 2024-01-29 ENCOUNTER — Telehealth: Payer: Self-pay | Admitting: *Deleted

## 2024-01-29 ENCOUNTER — Encounter: Payer: Self-pay | Admitting: Family Medicine

## 2024-01-29 ENCOUNTER — Ambulatory Visit: Admitting: Family Medicine

## 2024-01-29 VITALS — BP 90/60 | HR 64 | Ht 62.0 in | Wt 172.0 lb

## 2024-01-29 DIAGNOSIS — R42 Dizziness and giddiness: Secondary | ICD-10-CM

## 2024-01-29 DIAGNOSIS — I10 Essential (primary) hypertension: Secondary | ICD-10-CM

## 2024-01-29 DIAGNOSIS — Z23 Encounter for immunization: Secondary | ICD-10-CM | POA: Diagnosis not present

## 2024-01-29 NOTE — Telephone Encounter (Signed)
 Copied from CRM [id number]. Topic: General - Other >> Jan 29, 2024 11:53 AM Wess RAMAN wrote: Reason for CRM: Patient would like to know if it time to get her flu shot.  Callback #: [phone]  Yes, will advise @ 1:45pm appt today.

## 2024-01-29 NOTE — Progress Notes (Signed)
 Chief Complaint  Patient presents with   Dizziness    Off and on dizziness when she stands, sits or lays down. Happens as well when she turns from side to side for a few weeks. Feels like a shock in her brain at night when she lays down to go to bed.    Discussed the use of AI scribe software for clinical note transcription with the patient, who gave verbal consent to proceed.   Alicia Raymond is a 68 year old female who presents with episodes of feeling off balance.   She experiences episodes of disequilibrium, feeling as though she might fall and needing to grab onto something for support. These episodes occur while sitting, such as during breakfast or on the commode. Drinking Sprite sometimes provides temporary relief. There are no sensations of vertigo, syncope, or blackout episodes. She is worried about these episodes occurring while driving. She had 2 episodes yesterday, none today.  She described some lightheadedness, but denies presyncope.  She has persistent tinnitus for years with no recent changes in hearing, denies ear fullness or pain. She denies any URI symptoms or congestion. She drinks plenty of water and denies dehydration. Reports urine appears yellow due to taking a B complex vitamin.  She reports numbness in her right hand, sometimes all 5 fingers, currently just in the fourth and fifth fingers, which started a few weeks ago. She is right-handed and plays games on her phone but does not engage in repetitive motions otherwise.  Hypertension: BP's at home have been 126/78, no high or low BP's. Denies chest pain, palpitations, shortness of breath.   Asking for immunizations--flu, prevnar, COVID  Immunization History  Administered Date(s) Administered   Fluad Quad(high Dose 65+) 01/16/2019, 01/31/2022, 01/28/2023   INFLUENZA, HIGH DOSE SEASONAL PF 02/13/2020   Influenza Inj Mdck Quad Pf 02/09/2018   Influenza Split 04/03/2012, 02/08/2013, 02/12/2014    Influenza-Unspecified 02/13/2015, 02/18/2016, 02/16/2017, 02/16/2021   PFIZER(Purple Top)SARS-COV-2 Vaccination 07/13/2019, 08/07/2019, 02/27/2020, 10/06/2020   Pfizer Covid-19 Vaccine Bivalent Booster 23yrs & up 03/08/2021   Pfizer(Comirnaty)Fall Seasonal Vaccine 12 years and older 03/17/2022, 01/28/2023   Pneumococcal Conjugate-13 12/12/2018   Pneumococcal Polysaccharide-23 02/08/2013, 03/20/2020   Tdap 02/12/2014   Zoster Recombinant(Shingrix) 02/16/2017, 06/23/2017   Zoster, Live 08/26/2014    PMH, PSH, SH reviewed  Outpatient Encounter Medications as of 01/29/2024  Medication Sig Note   b complex vitamins tablet Take 1 tablet by mouth daily.    CALCIUM PO Take 1 tablet by mouth daily.     Cholecalciferol (VITAMIN D  PO) Take 1 capsule by mouth daily.  04/13/2023: 1000iu   hydroxychloroquine  (PLAQUENIL ) 200 MG tablet Take 1 tablet (200 mg total) by mouth daily.    losartan  (COZAAR ) 50 MG tablet Take 1 tablet (50 mg total) by mouth daily.    metoprolol  succinate (TOPROL -XL) 25 MG 24 hr tablet Take 1 tablet (25 mg total) by mouth daily.    Omega-3 Fatty Acids (FISH OIL) 1000 MG CAPS Take 1 capsule by mouth daily.    azithromycin  (ZITHROMAX ) 250 MG tablet Take 2 tablets by mouth on first day, then 1 tablet by mouth on days 2 through 5    fluticasone  (FLONASE ) 50 MCG/ACT nasal spray Place 2 sprays into both nostrils daily. (Patient not taking: Reported on 01/29/2024) 01/29/2024: As needed   hydrocortisone  (ANUSOL -HC) 2.5 % rectal cream Use externally to hemorrhoids 2-3 times/day if needed for hemorrhoid flare (pain, bleeding, itching) (Patient not taking: Reported on 01/29/2024) 01/29/2024: As needed  No facility-administered encounter medications on file as of 01/29/2024.   Allergies  Allergen Reactions   Septra [Bactrim] Swelling    Couldn't breathe, lip swelling     ROS: no fever, chills, URI or allergy symptoms. No cough, shortness of breath, chest pain, edema. +tinnitus, no change in  hearing or ear pain. Episodes of feeling off balanced, per HPI. No n/v/d, melena, hematochezia, abdominal pain, urinary symptoms. No bleeding or bruising. Numbness in R hand for 2-4 weeks, thought from bracelet, but didn't resolve when she stopped wearing it. No typing, just games on her phone. Sometimes involves all 5 fingers of hand, currently just 4th and 5th   PHYSICAL EXAM:  BP 90/60 (BP Location: Right Arm, Cuff Size: Normal)   Pulse 64   Ht 5' 2 (1.575 m)   Wt 172 lb (78 kg)   BMI 31.46 kg/m   Orthostatic Vitals for the past 48 hrs (Last 6 readings):  BP Pulse BP Location BP Method Cuff Size Patient Position (if appropriate)  01/29/24 1330 120/70 72 -- -- -- --  01/29/24 1334 110/70 64 Right Arm Manual Normal Lying  01/29/24 1336 90/60 -- Right Arm Manual Normal Standing    Wt Readings from Last 3 Encounters:  01/29/24 172 lb (78 kg)  10/03/23 167 lb 12.8 oz (76.1 kg)  04/17/23 171 lb (77.6 kg)   Pleasant, well-appearing female in no distress HEENT: PERRL, EOMI fundi benign. TM's and EAC's normal.  OP normal, mucous membranes moist. Mild nasal mucosal edema, no drainage. Sinuses nontender NECK: No cervical lymphadenopathy or thyromegaly. No carotid bruit CHEST: Lungs clear to auscultation bilaterally. CARDIOVASCULAR: Heart regular rate and rhythm, no murmurs. No carotid bruits. Back: no spinal or CVA tenderness NEUROLOGICAL: cranial nerves intact. Normal finger to nose. Motor strength 5/5 in all extremities, normal sensation. DTR's are 2+ and symmetric. Felt off-balance (symptomatic) when she sat up with her head turned to the left. This was very short-lived (a few seconds). Did not have any issues with other positions. Psych: normal mood, affect, hygiene and grooming Extremities: no edema. Negative Tinel and Phalen.  Subjective decreased sensation to R 4th and 5th fingers, normal to light touch. Nontender at R ulnar nerve at elbow.    ASSESSMENT/PLAN:  Loss of  equilibrium - having some positional equilibrium issues, not true vertigo. Short-lived. Reproducible with head turned to L, poss BPV. Meclizine  prn - Plan: Comprehensive metabolic panel with GFR, CBC with Differential/Platelet  Dizziness - suspect mild BPV. Treat for allergies, meclizine  prn. borderline orthostatic BP's, normal pulse. To stay well hydrated - Plan: Comprehensive metabolic panel with GFR, CBC with Differential/Platelet  Lightheadedness - most of her dizziness is related to equilibrium. Some LH, BP low. Encouraged to stay well hydrated - Plan: Comprehensive metabolic panel with GFR, CBC with Differential/Platelet  Essential hypertension - BP low today, higher at home. To cont low Na diet, monitor BP regularly, contact us  if they stay low.  Need for influenza vaccination - Plan: Flu vaccine HIGH DOSE PF(Fluzone Trivalent)  Need for pneumococcal 20-valent conjugate vaccination - Plan: Pneumococcal conjugate vaccine 20-valent (Prevnar 20)  Flu, Prevnar-20 today Additional vaccine recommendations include: RSV from the pharmacy COVID from the pharmacy  Hydration, cbc, c-met Avoid loooking to left with position changes. Zyrtec or other antihistamine recommended as she may be starting to have allergy symptoms, which might get worse. Meclizine  prn for vertigo spells. F/u if worsening equilibrium issues  Off balance episodes with possible vertigo Intermittent off balance episodes without full spinning.  Possible inner ear issues, congestion, or dehydration. No severe symptoms suggestive of stroke. Slight dizziness with head turned left. - Start Zyrtec or Claritin daily. - Use meclizine  as needed. - Avoid head positions that trigger symptoms, especially turning head left. - Ensure adequate hydration. - Order CBC and chem panel. - Consider referral to physical therapy for Epley maneuvers if symptoms worsen.  Possible orthostatic hypotension Blood pressure drops from sitting to  standing, possibly contributing to off balance episodes. - Ensure adequate hydration. - Order CBC and chem panel.  Right hand numbness involving fourth and fifth fingers Intermittent numbness in right hand, affecting fourth and fifth fingers. Possible ulnar nerve compression or cervical radiculopathy. No weakness or carpal tunnel syndrome noted. - Monitor symptoms for progression or weakness. - Avoid direct pressure on ulnar nerve. - Follow up if symptoms worsen or weakness develops.  Recording duration: 27 minutes I spent 38 minutes dedicated to the care of this patient, including pre-visit review of records, face to face time, post-visit ordering of testing and documentation.

## 2024-01-29 NOTE — Patient Instructions (Addendum)
 We gave you flu and prevnar-20 vaccines today. Wait 2 weeks from today's vaccines before getting others. You should get the RSV vaccine and COVID booster from the pharmacy.  OFF BALANCE EPISODES WITH POSSIBLE VERTIGO: You have been experiencing episodes of feeling off balance without full spinning sensations. This could be related to inner ear issues, congestion, or dehydration, among other causes -Start taking Zyrtec or Claritin daily (you had some mild swelling in the nose which could indicate some allergies) -Use meclizine  as needed for equlibrium spells (found in Bonine or less drowsy formulation of Dramamine, over-the-counter). This might cause some drowsiness, use with caution, when needed. -Avoid head positions that trigger symptoms--seemed to be with turning your head to the left. -Ensure you are drinking enough water. -We have ordered blood tests (CBC and chem panel) to check for any underlying issues. -If your symptoms worsen, we may refer you to physical therapy for Epley maneuvers.  POSSIBLE ORTHOSTATIC HYPOTENSION: Your blood pressure may be dropping when you move from sitting to standing, which could be contributing to your off balance episodes. -Ensure you are drinking enough water. -We have ordered blood tests (CBC and chem panel) to check for any underlying issues.  RIGHT HAND NUMBNESS INVOLVING FOURTH AND FIFTH FINGERS: You have numbness in your right hand, particularly in the fourth and fifth fingers. This could be due to ulnar nerve compression or an issue with your cervical spine. -Monitor your symptoms for any progression or development of weakness. -Avoid putting direct pressure on your ulnar nerve (at the elbow--don't rest arm on hard surfaces) -Follow up if your symptoms worsen or if you develop any weakness.

## 2024-01-30 ENCOUNTER — Ambulatory Visit: Payer: Self-pay | Admitting: Family Medicine

## 2024-01-30 LAB — CBC WITH DIFFERENTIAL/PLATELET
Basophils Absolute: 0.1 x10E3/uL (ref 0.0–0.2)
Basos: 1 %
EOS (ABSOLUTE): 0.2 x10E3/uL (ref 0.0–0.4)
Eos: 4 %
Hematocrit: 42.1 % (ref 34.0–46.6)
Hemoglobin: 13.3 g/dL (ref 11.1–15.9)
Immature Grans (Abs): 0 x10E3/uL (ref 0.0–0.1)
Immature Granulocytes: 0 %
Lymphocytes Absolute: 1.7 x10E3/uL (ref 0.7–3.1)
Lymphs: 31 %
MCH: 29.8 pg (ref 26.6–33.0)
MCHC: 31.6 g/dL (ref 31.5–35.7)
MCV: 94 fL (ref 79–97)
Monocytes Absolute: 0.5 x10E3/uL (ref 0.1–0.9)
Monocytes: 9 %
Neutrophils Absolute: 3 x10E3/uL (ref 1.4–7.0)
Neutrophils: 55 %
Platelets: 221 x10E3/uL (ref 150–450)
RBC: 4.46 x10E6/uL (ref 3.77–5.28)
RDW: 13.7 % (ref 11.7–15.4)
WBC: 5.5 x10E3/uL (ref 3.4–10.8)

## 2024-01-30 LAB — COMPREHENSIVE METABOLIC PANEL WITH GFR
ALT: 18 IU/L (ref 0–32)
AST: 24 IU/L (ref 0–40)
Albumin: 4.1 g/dL (ref 3.9–4.9)
Alkaline Phosphatase: 57 IU/L (ref 44–121)
BUN/Creatinine Ratio: 18 (ref 12–28)
BUN: 15 mg/dL (ref 8–27)
Bilirubin Total: 0.5 mg/dL (ref 0.0–1.2)
CO2: 22 mmol/L (ref 20–29)
Calcium: 9.3 mg/dL (ref 8.7–10.3)
Chloride: 107 mmol/L — AB (ref 96–106)
Creatinine, Ser: 0.83 mg/dL (ref 0.57–1.00)
Globulin, Total: 2.7 g/dL (ref 1.5–4.5)
Glucose: 92 mg/dL (ref 70–99)
Potassium: 4.2 mmol/L (ref 3.5–5.2)
Sodium: 143 mmol/L (ref 134–144)
Total Protein: 6.8 g/dL (ref 6.0–8.5)
eGFR: 76 mL/min/1.73 (ref >59)

## 2024-02-28 LAB — OPHTHALMOLOGY REPORT-SCANNED

## 2024-03-04 ENCOUNTER — Other Ambulatory Visit: Payer: Self-pay | Admitting: Internal Medicine

## 2024-04-03 ENCOUNTER — Other Ambulatory Visit: Payer: Self-pay | Admitting: Internal Medicine

## 2024-04-16 ENCOUNTER — Encounter: Payer: Self-pay | Admitting: Internal Medicine

## 2024-04-23 NOTE — Progress Notes (Unsigned)
 No chief complaint on file.   Alicia Raymond is a 68 y.o. female who presents for annual physical exam,  Medicare annual wellness visit (see separate note), and follow-up on chronic medical conditions.     She was seen in September with some dizziness. Felt to have mild BPV (symptoms when looking to the left), and had borderline orthostatic/low BP's.  She was encouraged to stay well hydrated, treat allergies, use meclizine  prn.  C-met and CBC were normal at that time.  She had also reported tingling/numbness in her R hand at September 2025 visit.  At the time of the visit, tingling was only in her 4th and 5th fingers, suspected related to ulnar nerve (ddx included ulnar nerve compression, cervical radiculopathy; eval negative for CTS at that visit).  We discussed position of arm/elbow.  ***UPDATE  Hemorrhoids:  She has anusol  HC to use prn. She was seen in May 2025 with a small thrombosed hemorrhoid. Hemorrhoids bleed occasionally, when constipated (a few times/year). ***UPDATE   She is under the care of Dr. Mona for apical variant hypertrophic cardiomyopathy, mild hypertension and SVT. She was last seen in 02/2023.  She continues on losartan , for HTN and apical hypertrophic cardiomyopathy. She is also on metoprolol  (for HTN and SVT). She denies any chest pain, headaches, syncope, edema or any side effects to medications. Dizziness/lightheadedness ***UPDATE  She had calcium score of zero in 12/2022 (had abnormal mammo+heart study through [facility] [facility]).   Arthritis and discoid lupus:  Under the care of Dr. Ishmael, doing well on Plaquenil . She requested referral to Dr. Dolphus in August, due to Dr. Ishmael no longer taking her insurance. She has an appointment scheduled for February with Dr. Dolphus. 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  Arthritis is well controlled.   Osteopenia:  last DEXA was in 12/2023, T-1.5 at spine.  She takes 500mg  of calcium once daily.  She likes almond milk, has 1 glass daily.  No dairy products (gets diarrhea after eating).  She gets weight-bearing exercise regularly.  She takes vitamin D --level was low last year.  Vitamin D  deficiency: Level was low last year (25) when reportedly taking 1000 IU daily and some D with her calcium. She was treated with prescription weekly D x 3 months, and advised to increase D3 to 2000 IU daily. She is currently taking ***  Component Ref Range & Units (hover) 1 yr ago 7 yr ago 10 yr ago  Vit D, 25-Hydroxy 25.0 Low  34 R, CM 45 R, CM    Obesity:  She continues to exercise daily.  Current diet-- Only drinks Sprite if she feels she needs something sweet, not on a regular basis (1/month).  No juice, no longer drinks any wine regularly (just when family comes for dinner, once a month). +Rice, portion is 1/2 cup (just a few spoonfuls). Denies potatoes, pasta, breads. Protein sources include fish, beef, chicken, reports portions are small. No fried foods, rare fast food. Snacks include fruit (banana, apple, carrots). No sweets or nuts.  Rare ice cream. She cut back on eggs, having 1 boiled egg daily (down from 2).    Immunization History  Administered Date(s) Administered   Fluad Quad(high Dose 65+) 01/16/2019, 01/31/2022, 01/28/2023   INFLUENZA, HIGH DOSE SEASONAL PF 02/13/2020, 01/29/2024   Influenza Inj Mdck Quad Pf 02/09/2018   Influenza Split 04/03/2012, 02/08/2013, 02/12/2014   Influenza-Unspecified 02/13/2015, 02/18/2016, 02/16/2017, 02/16/2021   PFIZER(Purple Top)SARS-COV-2 Vaccination 07/13/2019,  08/07/2019, 02/27/2020, 10/06/2020   PNEUMOCOCCAL CONJUGATE-20 01/29/2024   Pfizer Covid-19 Vaccine Bivalent Booster 61yrs & up 03/08/2021   Pfizer(Comirnaty)Fall Seasonal Vaccine 12 years and older 03/17/2022, 01/28/2023   Pneumococcal Conjugate-13 12/12/2018   Pneumococcal Polysaccharide-23 02/08/2013, 03/20/2020   Tdap 02/12/2014   Zoster  Recombinant(Shingrix) 02/16/2017, 06/23/2017   Zoster, Live 08/26/2014   Last Pap smear: 03/2023, atrophy, NILM, no high risk HPV Last mammogram: 12/2023 Last colonoscopy: 03/2021 Dr. Nehemiah serrated polyps, f/u 5 years recommended (Dr. Rollin).  EGD also done 03/2021 by Dr. Teddy chronic inactive gastritis, negative for H.pylori Last DEXA:  12/2023 T-1.5 spine (prev 11/2020 T-1.8 spine, 11/2016: T-1.7 spine, 09/2014 T-1.6 spine, 12/2012 T-1.3) Dentist: twice yearly Ophtho: yearly Exercise:    She rides recumbent bike 60 minutes twice daily (while watching TV), 4-5 days/week, aerobics classes through [employer] 30 minutes daily (every day). Walks 2x/week. Weights at home/work (daily, upper and lower body) using 5# dumbbells.   Still does yoga stretches daily.   Lipids: Lipids were elevated in 2021, with LDL 145. Improved since Lab Results  Component Value Date   CHOL 246 (H) 04/13/2023   HDL 111 04/13/2023   LDLCALC 121 (H) 04/13/2023   TRIG 82 04/13/2023   CHOLHDL 2.2 04/13/2023   Thyroid  screen: Lab Results  Component Value Date   TSH 1.010 04/13/2023    End of Life Discussion:  Patient completed a living will and medical power of attorney, copy scanned into chart.   PMH, PSH, SH and FH were reviewed and updated     ROS:  The patient denies anorexia, fever, vision changes, decreased hearing, ear pain, sore throat, breast concerns, chest pain, dizziness, syncope, dyspnea on exertion, cough, swelling, nausea, vomiting, diarrhea, constipation, abdominal pain, melena, hematochezia, indigestion/heartburn, hematuria, incontinence, dysuria, postmenopausal bleeding, vaginal discharge, odor or itch, genital lesions, weakness, tremor, suspicious skin lesions, abnormal bleeding/bruising, or enlarged lymph nodes.  Ringing in her right ear (intermittent); denies hearing loss. Unchanged. She has continued hair loss, some dry skin.  Occasional constipation.  She wants thyroid   rechecked today. No rashes or flares of discoid lupus. R elbow pain, improving. Slight tickle in throat today   PHYSICAL EXAM:  There were no vitals taken for this visit.  Wt Readings from Last 3 Encounters:  01/29/24 172 lb (78 kg)  10/03/23 167 lb 12.8 oz (76.1 kg)  04/17/23 171 lb (77.6 kg)   General Appearance:     Alert, cooperative, no distress, appears stated age.   Head:     Normocephalic, without obvious abnormality, atraumatic   Eyes:     PERRL, conjunctiva/corneas clear, EOM's intact, fundi benign   Ears:     Normal TM's and external ear canals   Nose:    Nasal mucosa is mildly edematous, no drainage or erythema. Sinuses nontender  Throat:    Normal mucosa, no lesions.  +braces and rubberbands ***  Neck:    Supple, no lymphadenopathy;  thyroid :  no enlargement/ tenderness/nodules; no carotid bruit or JVD   Back:     Spine nontender, no curvature, ROM normal, no CVA tenderness   Lungs:      Clear to auscultation bilaterally without wheezes, rales or ronchi; respirations unlabored   Chest Wall:     No tenderness or deformity    Heart:     Regular rate and rhythm, S1 and S2 normal, no murmur, rub or gallop   Breast Exam:     No tenderness, masses, or nipple discharge or inversion. No axillary lymphadenopathy.  WHSS right upper breast   Abdomen:      Soft, non-tender, nondistended, normoactive bowel sounds, no masses, no hepatosplenomegaly   Genitalia:     Not examined   Rectal:     Not examined  Extremities:    No clubbing, cyanosis or edema.   Pulses:    1+ and symmetric all extremities   Skin:    Skin color, texture, turgor normal, no rashes or lesions.  Lymph nodes:    Cervical, supraclavicular, and axillary nodes normal   Neurologic:    Normal strength, gait; reflexes 2+ and symmetric throughout.                     Psych:   Normal mood, affect, hygiene and grooming  ***UPDATE braces. Rectal done, if complaints?   ASSESSMENT/PLAN:   Pelvic not needed unless  having sx/problems  Did she get TdaP, RSV, COVID booster from pharmacy? If not, can give COVID booster today, others need to come from pharmacy.  Vitamin D , lipids  ***UPDATE VACCINE RECS ON AVS  Discussed monthly self breast exams and yearly mammograms; at least 30 minutes of aerobic activity at least 5 days/week, weight-bearing exercise at least 2x/wk; proper sunscreen use reviewed; healthy diet, including goals of calcium and vitamin D  intake and alcohol recommendations (less than or equal to 1 drink/day) reviewed; regular seatbelt use; changing batteries in smoke detectors. Immunizations--Continue yearly high dose flu shots.  COVID booster *** RSV vaccine recommended from pharmacy.*** TdaP due, to get from pharmacy.    MOST form completed, full code, full care   F/u 1 year, sooner prn.

## 2024-04-23 NOTE — Progress Notes (Unsigned)
 No chief complaint on file.    Subjective:   Alicia Raymond is a 68 y.o. female who presents for a Medicare Annual Wellness Visit.  No data recorded  Allergies (verified) Septra [bactrim]   Current Medications (verified) Outpatient Encounter Medications as of 04/24/2024  Medication Sig   azithromycin  (ZITHROMAX ) 250 MG tablet Take 2 tablets by mouth on first day, then 1 tablet by mouth on days 2 through 5   b complex vitamins tablet Take 1 tablet by mouth daily.   CALCIUM PO Take 1 tablet by mouth daily.    Cholecalciferol (VITAMIN D  PO) Take 1 capsule by mouth daily.    fluticasone  (FLONASE ) 50 MCG/ACT nasal spray Place 2 sprays into both nostrils daily. (Patient not taking: Reported on 01/29/2024)   hydrocortisone  (ANUSOL -HC) 2.5 % rectal cream Use externally to hemorrhoids 2-3 times/day if needed for hemorrhoid flare (pain, bleeding, itching) (Patient not taking: Reported on 01/29/2024)   hydroxychloroquine  (PLAQUENIL ) 200 MG tablet Take 1 tablet (200 mg total) by mouth daily.   losartan  (COZAAR ) 50 MG tablet Take 1 tablet (50 mg total) by mouth daily.   metoprolol  succinate (TOPROL -XL) 25 MG 24 hr tablet TAKE 1 TABLET DAILY   Omega-3 Fatty Acids (FISH OIL) 1000 MG CAPS Take 1 capsule by mouth daily.   No facility-administered encounter medications on file as of 04/24/2024.    History: Past Medical History:  Diagnosis Date   Apical variant hypertrophic cardiomyopathy (HCC)    Dr. 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 Mona   Osteoarthritis    Dr. Dolphus (NOT rheumatoid)   PSVT (paroxysmal supraventricular tachycardia)    Past Surgical History:  Procedure Laterality Date   BREAST BIOPSY Right 1988   fibroadenoma (benign)   CARDIAC CATHETERIZATION  04/12/2010   LAD with 30% narrowing - minimal CAD (Dr. LOIS Mona)   TRANSTHORACIC ECHOCARDIOGRAM  03/2010   EF=>55%, apical hypertrophy, mod conc LVH;  mild MR; trace TR with normal RVSP   TUBAL LIGATION     Family History  Problem Relation Age of Onset   Hypertension Mother    Lung cancer Father    Cancer Father    Other Sister        brain tumor (benign)   Cancer Sister        stomach cancer   Cancer Sister 67       metastatic (in abdomen), unknown primary, died 2 mos later   COPD Brother    Hyperlipidemia Brother    Supraventricular tachycardia Daughter    Hyperlipidemia Son    Diabetes Neg Hx    Colon cancer Neg Hx    Breast cancer Neg Hx    Social History   Occupational History   Occupation: Psychologist, Counselling: [employer]  Tobacco Use   Smoking status: Never   Smokeless tobacco: Never  Vaping Use   Vaping status: Never Used  Substance and Sexual Activity   Alcohol use: Yes    Comment: cut back, now only when has company, <1/month   Drug use: No   Sexual activity: Not Currently    Partners: Male    Comment: due to libido, discomfort.   Tobacco Counseling Counseling given: Not Answered  SDOH Screenings   Depression (PHQ2-9): Low Risk  (04/13/2023)  Tobacco Use: Low Risk  (01/29/2024)   See flowsheets for full screening details  Depression Screen     Goals Addressed   None          Objective:    There were no vitals filed for this visit. There is no height or weight on file to calculate BMI.  Hearing/Vision screen No results found. Immunizations and Health Maintenance Health Maintenance  Topic Date Due   COVID-19 Vaccine (8 - 2025-26 season) 01/22/2024   DTaP/Tdap/Td (2 - Td or Tdap) 02/13/2024   Medicare Annual Wellness (AWV)  04/12/2024   Mammogram  12/27/2024   Colonoscopy  04/20/2026   Pneumococcal Vaccine: 50+ Years  Completed   Influenza Vaccine  Completed   Bone Density Scan  Completed   Hepatitis C Screening  Completed   Zoster Vaccines- Shingrix  Completed   Meningococcal B Vaccine  Aged Out   Hepatitis B Vaccines 19-59 Average Risk  Discontinued         Assessment/Plan:  This is a routine wellness examination for Alicia Raymond.  Patient Care Team: Randol Dawes, MD as PCP - General (Family Medicine) Mona Vinie BROCKS, MD as PCP - Cardiology (Cardiology) Ishmael Slough, MD as Consulting Physician (Rheumatology) Debarah Lorrene DEL., MD (Ophthalmology) Rollin Dover, MD as Consulting Physician (Gastroenterology) Jane Charleston, MD as Consulting Physician (Orthopedic Surgery) [facility], [place], [place], Slough Garre, [place] as Physician Assistant (Cardiology)  I have personally reviewed and noted the following in the patient's chart:   Medical and social history Use of alcohol, tobacco or illicit drugs  Current medications and supplements including opioid prescriptions. Functional ability and status Nutritional status Physical activity Advanced directives List of other physicians Hospitalizations, surgeries, and ER visits in previous 12 months Vitals Screenings to include cognitive, depression, and falls Referrals and appointments  No orders of the defined types were placed in this encounter.  In addition, I have reviewed and discussed with patient certain preventive protocols, quality metrics, and best practice recommendations. A written personalized care plan for preventive services as well as general preventive health recommendations were provided to patient.   Dawes DELENA Randol, MD   04/23/2024   No follow-ups on file.  After Visit Summary: (In Person-Printed) AVS printed and given to the patient  Nurse Notes: none

## 2024-04-23 NOTE — Patient Instructions (Incomplete)
  HEALTH MAINTENANCE RECOMMENDATIONS:  It is recommended that you get at least 30 minutes of aerobic exercise at least 5 days/week (for weight loss, you may need as much as 60-90 minutes). This can be any activity that gets your heart rate up. This can be divided in 10-15 minute intervals if needed, but try and build up your endurance at least once a week.  Weight bearing exercise is also recommended twice weekly.  Eat a healthy diet with lots of vegetables, fruits and fiber.  Colorful foods have a lot of vitamins (ie green vegetables, tomatoes, red peppers, etc).  Limit sweet tea, regular sodas and alcoholic beverages, all of which has a lot of calories and sugar.  Up to 1 alcoholic drink daily may be beneficial for women (unless trying to lose weight, watch sugars).  Drink a lot of water.  Calcium recommendations are 1200-1500 mg daily (1500 mg for postmenopausal women or women without ovaries), and vitamin D  1000 IU daily.  This should be obtained from diet and/or supplements (vitamins), and calcium should not be taken all at once, but in divided doses.  Monthly self breast exams and yearly mammograms for women over the age of 86 is recommended.  Sunscreen of at least SPF 30 should be used on all sun-exposed parts of the skin when outside between the hours of 10 am and 4 pm (not just when at beach or pool, but even with exercise, golf, tennis, and yard work!)  Use a sunscreen that says broad spectrum so it covers both UVA and UVB rays, and make sure to reapply every 1-2 hours.  Remember to change the batteries in your smoke detectors when changing your clock times in the spring and fall. Carbon monoxide detectors are recommended for your home.  Use your seat belt every time you are in a car, and please drive safely and not be distracted with cell phones and texting while driving.   Alicia Raymond , Thank you for taking time to come for your Medicare Wellness Visit. I appreciate your  ongoing commitment to your health goals. Please review the following plan we discussed and let me know if I can assist you in the future.   This is a list of the screening recommended for you and due dates:  Health Maintenance  Topic Date Due   COVID-19 Vaccine (8 - 2025-26 season) 01/22/2024   DTaP/Tdap/Td vaccine (2 - Td or Tdap) 02/13/2024   Medicare Annual Wellness Visit  04/12/2024   Breast Cancer Screening  12/27/2024   Colon Cancer Screening  04/20/2026   Pneumococcal Vaccine for age over 58  Completed   Flu Shot  Completed   Osteoporosis screening with Bone Density Scan  Completed   Hepatitis C Screening  Completed   Zoster (Shingles) Vaccine  Completed   Meningitis B Vaccine  Aged Out   Hepatitis B Vaccine  Discontinued    Please get the tetanus booster (TdaP) from the pharmacy.

## 2024-04-24 ENCOUNTER — Encounter: Payer: Self-pay | Admitting: Family Medicine

## 2024-04-24 ENCOUNTER — Ambulatory Visit: Payer: Self-pay | Admitting: Family Medicine

## 2024-04-24 VITALS — BP 120/70 | HR 64 | Ht 62.0 in | Wt 172.0 lb

## 2024-04-24 DIAGNOSIS — M150 Primary generalized (osteo)arthritis: Secondary | ICD-10-CM | POA: Diagnosis not present

## 2024-04-24 DIAGNOSIS — L930 Discoid lupus erythematosus: Secondary | ICD-10-CM

## 2024-04-24 DIAGNOSIS — E6609 Other obesity due to excess calories: Secondary | ICD-10-CM

## 2024-04-24 DIAGNOSIS — I422 Other hypertrophic cardiomyopathy: Secondary | ICD-10-CM

## 2024-04-24 DIAGNOSIS — E66811 Obesity, class 1: Secondary | ICD-10-CM

## 2024-04-24 DIAGNOSIS — G5601 Carpal tunnel syndrome, right upper limb: Secondary | ICD-10-CM

## 2024-04-24 DIAGNOSIS — E7800 Pure hypercholesterolemia, unspecified: Secondary | ICD-10-CM

## 2024-04-24 DIAGNOSIS — E559 Vitamin D deficiency, unspecified: Secondary | ICD-10-CM

## 2024-04-24 DIAGNOSIS — Z0000 Encounter for general adult medical examination without abnormal findings: Secondary | ICD-10-CM | POA: Diagnosis not present

## 2024-04-24 DIAGNOSIS — L918 Other hypertrophic disorders of the skin: Secondary | ICD-10-CM | POA: Diagnosis not present

## 2024-04-24 DIAGNOSIS — Z6831 Body mass index (BMI) 31.0-31.9, adult: Secondary | ICD-10-CM | POA: Diagnosis not present

## 2024-04-24 DIAGNOSIS — K644 Residual hemorrhoidal skin tags: Secondary | ICD-10-CM

## 2024-04-24 DIAGNOSIS — M8588 Other specified disorders of bone density and structure, other site: Secondary | ICD-10-CM

## 2024-04-25 ENCOUNTER — Ambulatory Visit: Payer: Self-pay | Admitting: Family Medicine

## 2024-04-25 LAB — LIPID PANEL
Chol/HDL Ratio: 2.1 ratio (ref 0.0–4.4)
Cholesterol, Total: 247 mg/dL — ABNORMAL HIGH (ref 100–199)
HDL: 115 mg/dL (ref >39)
LDL Chol Calc (NIH): 115 mg/dL — ABNORMAL HIGH (ref 0–99)
Triglycerides: 100 mg/dL (ref 0–149)
VLDL Cholesterol Cal: 17 mg/dL (ref 5–40)

## 2024-04-25 LAB — VITAMIN D 25 HYDROXY (VIT D DEFICIENCY, FRACTURES): Vit D, 25-Hydroxy: 41 ng/mL (ref 30.0–100.0)

## 2024-04-30 ENCOUNTER — Telehealth: Payer: Self-pay

## 2024-04-30 NOTE — Progress Notes (Signed)
   04/30/2024  Patient ID: Alicia Raymond, female   DOB: 1956-05-06, 68 y.o.   MRN: [id number]  Pharmacy Quality Measure Review  This patient is appearing on a report for being at risk of failing the adherence measure for hypertension (ACEi/ARB) medications this calendar year.   Medication: Losartan  50mg  Last fill date: 12/12/23 for 100 day supply  Will collaborate with provider to facilitate refill needs.  Jon VEAR Lindau, PharmD Clinical Pharmacist [phone]

## 2024-05-01 ENCOUNTER — Telehealth: Payer: Self-pay | Admitting: Pharmacist

## 2024-05-01 MED ORDER — LOSARTAN POTASSIUM 50 MG PO TABS: 50.0000 mg | ORAL_TABLET | Freq: Every day | ORAL | 1 refills | Status: DC

## 2024-05-01 MED ORDER — LOSARTAN POTASSIUM 50 MG PO TABS: 50.0000 mg | ORAL_TABLET | Freq: Every day | ORAL | 1 refills | Status: AC

## 2024-05-01 NOTE — Addendum Note (Signed)
 Addended by: DARRELL BRUCKNER on: 05/01/2024 08:13 AM   Modules accepted: Orders

## 2024-05-01 NOTE — Addendum Note (Signed)
 Addended by: DARRELL BRUCKNER on: 05/01/2024 08:25 AM   Modules accepted: Orders

## 2024-05-01 NOTE — Addendum Note (Signed)
 Addended by: DARRELL BRUCKNER on: 05/01/2024 07:26 AM   Modules accepted: Orders

## 2024-05-14 NOTE — Telephone Encounter (Signed)
 Creating new encounter to send medication

## 2024-05-29 ENCOUNTER — Encounter: Payer: Self-pay | Admitting: Internal Medicine

## 2024-05-29 ENCOUNTER — Ambulatory Visit: Attending: Internal Medicine | Admitting: Internal Medicine

## 2024-05-29 VITALS — BP 116/68 | HR 62 | Ht 62.0 in | Wt 173.0 lb

## 2024-05-29 DIAGNOSIS — Z6831 Body mass index (BMI) 31.0-31.9, adult: Secondary | ICD-10-CM

## 2024-05-29 DIAGNOSIS — I471 Supraventricular tachycardia, unspecified: Secondary | ICD-10-CM

## 2024-05-29 DIAGNOSIS — I10 Essential (primary) hypertension: Secondary | ICD-10-CM

## 2024-05-29 DIAGNOSIS — I422 Other hypertrophic cardiomyopathy: Secondary | ICD-10-CM

## 2024-05-29 MED ORDER — METOPROLOL SUCCINATE ER 25 MG PO TB24: 25.0000 mg | ORAL_TABLET | Freq: Every day | ORAL | 3 refills | Status: AC

## 2024-05-29 NOTE — Progress Notes (Signed)
 "      OFFICE NOTE  Chief Complaint:  Routine followup  Primary Care Physician: Randol Dawes, MD  HPI:  Alicia Raymond is a 69 year old female who has a history of apical hypertrophic cardiomyopathy as well as mild hypertension, rheumatoid arthritis and lupus. Overall, those connective tissue disorders have been very well controlled and she has been on losartan , which is hopefully going to be helpful with providing her some prevention from apical hypertrophy. Over the past year, she has stopped exercising, and gained a small amount of weight. She has had a couple of episodes of chest discomfort at night over the past year however they have subsided.  I had the pleasure of seeing Alicia Raymond back in the office today. Overall she's done very well over the past year. She started doing significant exercise almost every morning. Her weight is down now more than 15 pounds to 153. Blood pressure is well controlled at 118/70. She remains on losartan , mostly for apical hypertrophic cardiomyopathy. She's had no chest pain or worsening shortness of breath. She reports her rheumatoid arthritis is better controlled in fact she is on a lower dose of Plaquenil .  01/08/2016  [name] returns today for follow-up. She feels that she is doing really well. She lost another 3 pounds and denies any chest pain or worsening shortness of breath. Unfortunately her sister is quite ill in [place]  and she is under a lot of stress as she's been traveling and visiting with her. Blood pressure appears excellent today. Her last echocardiogram was in 2011 and has not been reassessed since that time. EKG shows sinus rhythm with LVH and T-wave changes apically and anterolaterally consistent with her prior diagnosis of apical hypertrophic cardiomyopathy. With regards to her RA and lupus those have been well controlled on low-dose plaquenil .  04/17/2017  Alicia Raymond was seen today as an add-on for heart pounding.  She reports over  the past week she has felt like her heart was racing or pounding.  She says it occasionally is associated with discomfort in the chest and some numbness in her left arm.  Afterward she feels fatigued.  It is not worse with exertion or relieved by rest, and she is continued to be able to do exercises on a treadmill.  She says it comes on randomly and is associated with fatigue.  She says that her husband listened to her heart and noted that it was pounding harder and faster than she remembered.  05/22/2017  Alicia Raymond returns today for follow-up of her monitor results.  This did not show any extrasystoles or arrhythmias that would be associated with her symptoms of palpitations.  She says they seem to be worse when having disagreements with her husband and I suspect they are related to stress.  She has had a little physical responsibilities over the past week and has felt much better.  03/07/2018  Alicia Raymond returns today for follow-up.  She wore a monitor in November which demonstrated no arrhythmias to explain her symptoms.  She does have a history of tachypalpitations.  Recently she was in the emergency department and an EKG was able to capture an SVT with heart rate into the 160s.  I was notified by the ER physician and recommended starting her on Toprol -XL 25 mg daily.  She reports taking the medicine although feels like she might be a little fatigue.  In general though her energy is much better and she says she has had significant improvement in her palpitations.  She  took a week vacation with her husband and and they went to the beach.  She was able to walk long distances and was a lot more active without having to stop and she was previously.  06/07/2018  Alicia Raymond was seen today in follow-up.  Overall she is doing well on Toprol .  She denies any recurrent SVT.  She has no concerning palpitations.  She denies chest pain or worsening shortness of breath.  She started walking more recently about 3 miles a day.  She  still battling the weight somewhat however does report eating fairly healthy diet.  06/18/2019  Alicia Raymond is seen today for annual follow-up.  Overall she reports good control of her palpitations although she occasionally has some brief breakthroughs.  It does not seem that she has enough episodes to warrant an increase in her medication.  She has no other symptoms related to apical hypertrophic cardiomyopathy.  She reports some occasional dizziness.  Blood pressure is well controlled today.  She continues to exercise regularly.  She is disappointed that she is not recently lost any weight because she says she is exercising for 40 to 60 minutes with both aerobic and resistance training, 3 times a day.  I thought this might be a little too much, but if she persists, I expect that she may eventually lose weight.  07/16/2020  Alicia Raymond is seen today in follow-up.  She saw Aline Door in October 2021 for tightness across her back and shortness of breath with palpitations.  She wore a monitor which showed some occasional SVT.  Her beta-blocker was increased to 25 mg daily with improvement in her palpitations.  A repeat echo showed normal function with apical hypertrophic cardiomyopathy.  This was already known.  Today she says her main issue over the past several nights is again tightness across her upper back.  Usually occurs when she goes to bed and she feels stressed or may have had an argument prior to that.  The symptoms are relieved somewhat by deep tissue massage.  03/23/2023  Alicia Raymond is seen today for follow-up.  She says she denies any recurrent palpitations or tachycardia.  She is felt well without any worsening shortness of breath or chest pain.  Her main concern today is inability to lose weight.  Currently her BMI is 31 and she does have treated hypertension.  She inquired about different methods to help her with her weight loss that she already is exercising regularly and has significantly cut portions in  her diet.  05/30/2023  Alicia Raymond returns today for follow-up.  She continues to do well without any palpitations or recurrent SVT.  Blood pressure is well-controlled today 116/68.  She has not been able to lose weight which is bothersome for her but she does exercise regularly which is good and I have encouraged her to continue that.  She reports her arthritis is fairly well-controlled.  She did have a coronary calcium score in 2024 which was negative.  PMHx:  Past Medical History:  Diagnosis Date   Apical variant hypertrophic cardiomyopathy (HCC)    Dr. 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 Mona   Osteoarthritis    Dr. Dolphus (NOT rheumatoid)   PSVT (paroxysmal supraventricular tachycardia)     Past Surgical History:  Procedure Laterality Date   BREAST BIOPSY Right 1988   fibroadenoma (benign)   CARDIAC CATHETERIZATION  04/12/2010   LAD  with 30% narrowing - minimal CAD (Dr. LOIS Maxcy)   TRANSTHORACIC ECHOCARDIOGRAM  03/2010   EF=>55%, apical hypertrophy, mod conc LVH; mild MR; trace TR with normal RVSP   TUBAL LIGATION      FAMHx:  Family History  Problem Relation Age of Onset   Hypertension Mother    Lung cancer Father    Cancer Father    Cancer Sister        stomach cancer   Cancer Sister 46       metastatic (in abdomen), unknown primary, died 2 mos later   Cancer Sister    Other Sister        brain tumor   COPD Brother    Hyperlipidemia Brother    Stroke Brother 73   Supraventricular tachycardia Daughter    Hyperlipidemia Son    Diabetes Neg Hx    Colon cancer Neg Hx    Breast cancer Neg Hx     SOCHx:   reports that she has never smoked. She has never used smokeless tobacco. She reports current alcohol use. She reports that she does not use drugs.  ALLERGIES:  Allergies  Allergen Reactions   Septra [Bactrim] Swelling    Couldn't breathe, lip swelling    ROS: Pertinent items noted in HPI  and remainder of comprehensive ROS otherwise negative.  HOME MEDS: Current Outpatient Medications  Medication Sig Dispense Refill   b complex vitamins tablet Take 1 tablet by mouth daily.     CALCIUM PO Take 1 tablet by mouth daily.      Cholecalciferol (VITAMIN D  PO) Take 1 capsule by mouth daily.      hydrocortisone  (ANUSOL -HC) 2.5 % rectal cream Use externally to hemorrhoids 2-3 times/day if needed for hemorrhoid flare (pain, bleeding, itching) 30 g 1   hydroxychloroquine  (PLAQUENIL ) 200 MG tablet Take 1 tablet (200 mg total) by mouth daily. 30 tablet 0   losartan  (COZAAR ) 50 MG tablet Take 1 tablet (50 mg total) by mouth daily. 90 tablet 1   metoprolol  succinate (TOPROL -XL) 25 MG 24 hr tablet TAKE 1 TABLET DAILY 15 tablet 0   Omega-3 Fatty Acids (FISH OIL) 1000 MG CAPS Take 1 capsule by mouth daily.     No current facility-administered medications for this visit.    LABS/IMAGING: No results found for this or any previous visit (from the past 48 hours). No results found.  VITALS: BP 116/68   Pulse 62   Ht 5' 2 (1.575 m)   Wt 173 lb (78.5 kg)   SpO2 99%   BMI 31.64 kg/m   EXAM: General appearance: alert and no distress Neck: no carotid bruit, no JVD and thyroid  not enlarged, symmetric, no tenderness/mass/nodules Lungs: clear to auscultation bilaterally Heart: regular rate and rhythm, S1, S2 normal, no murmur, click, rub or gallop Abdomen: soft, non-tender; bowel sounds normal; no masses,  no organomegaly Extremities: extremities normal, atraumatic, no cyanosis or edema Pulses: 2+ and symmetric Skin: Skin color, texture, turgor normal. No rashes or lesions Neurologic: Grossly normal : Pleasant  EKG: EKG Interpretation Date/Time:  Wednesday May 29 2024 09:20:19 EST Ventricular Rate:  62 PR Interval:  134 QRS Duration:  84 QT Interval:  456 QTC Calculation: 462 R Axis:   29  Text Interpretation: Normal sinus rhythm Left ventricular hypertrophy with  repolarization abnormality ( Sokolow-Lyon ) When compared with ECG of 23-Mar-2023 10:39, No significant change was found Confirmed by Maxcy Kent [phone]) on 05/29/2024 9:30:10 AM    ASSESSMENT: Obesity, BMI 31.5  Musculoskeletal upper back pain PSVT - improved on BB Apical variant hypertrophic cardiomyopathy Hypertension Rheumatoid arthritis-followed by rheumatologist [facility] (2024)  PLAN: 1.   Larosa continues to do well without any chest pain or worsening shortness of breath.  She denies any palpitations or SVT.  She had no coronary calcium in 2024.  Blood pressure is well-controlled.  No changes to her medicines today.  Follow-up with me or APP in 1 year or sooner as necessary.  Vinie KYM Maxcy, MD, [facility], FNLA, FACP  [place]  [facility]  Medical Director of the [facility]  [facility] Diplomate of the American Board of Clinical Lipidology Attending Cardiologist  Direct Dial: [phone]  Fax: [phone]  Website:  [email or web address] 05/29/2024, 9:30 AM "

## 2024-05-29 NOTE — Patient Instructions (Signed)
 Medication Instructions:  NO CHANGES  *If you need a refill on your cardiac medications before your next appointment, please call your pharmacy*   Follow-Up: At [facility], you and your health needs are our priority.  As part of our continuing mission to provide you with exceptional heart care, our providers are all part of one team.  This team includes your primary Cardiologist (physician) and Advanced Practice Providers or APPs (Physician Assistants and Nurse Practitioners) who all work together to provide you with the care you need, when you need it.  Your next appointment:    12 months with Dr. Mona or Katlyn NP, Damien NP, Kenzie NP  We recommend signing up for the patient portal called MyChart.  Sign up information is provided on this After Visit Summary.  MyChart is used to connect with patients for Virtual Visits (Telemedicine).  Patients are able to view lab/test results, encounter notes, upcoming appointments, etc.  Non-urgent messages can be sent to your provider as well.   To learn more about what you can do with MyChart, go to [email or web address].

## 2024-05-29 NOTE — Addendum Note (Signed)
 Addended by: LORING ANDRIETTE HERO on: 05/29/2024 10:36 AM   Modules accepted: Orders

## 2024-06-26 ENCOUNTER — Ambulatory Visit

## 2024-06-26 ENCOUNTER — Telehealth: Payer: Self-pay

## 2024-06-26 ENCOUNTER — Ambulatory Visit: Admitting: Rheumatology

## 2024-06-26 ENCOUNTER — Encounter: Payer: Self-pay | Admitting: Rheumatology

## 2024-06-26 VITALS — BP 136/89 | HR 64 | Temp 97.5°F | Resp 14 | Ht 62.0 in | Wt 173.0 lb

## 2024-06-26 DIAGNOSIS — M25561 Pain in right knee: Secondary | ICD-10-CM

## 2024-06-26 DIAGNOSIS — Z79899 Other long term (current) drug therapy: Secondary | ICD-10-CM

## 2024-06-26 DIAGNOSIS — M19041 Primary osteoarthritis, right hand: Secondary | ICD-10-CM

## 2024-06-26 DIAGNOSIS — R5383 Other fatigue: Secondary | ICD-10-CM | POA: Diagnosis not present

## 2024-06-26 DIAGNOSIS — G8929 Other chronic pain: Secondary | ICD-10-CM

## 2024-06-26 DIAGNOSIS — I498 Other specified cardiac arrhythmias: Secondary | ICD-10-CM | POA: Diagnosis not present

## 2024-06-26 DIAGNOSIS — I7300 Raynaud's syndrome without gangrene: Secondary | ICD-10-CM | POA: Diagnosis not present

## 2024-06-26 DIAGNOSIS — M79671 Pain in right foot: Secondary | ICD-10-CM

## 2024-06-26 DIAGNOSIS — L930 Discoid lupus erythematosus: Secondary | ICD-10-CM | POA: Diagnosis not present

## 2024-06-26 DIAGNOSIS — I422 Other hypertrophic cardiomyopathy: Secondary | ICD-10-CM

## 2024-06-26 DIAGNOSIS — M8589 Other specified disorders of bone density and structure, multiple sites: Secondary | ICD-10-CM

## 2024-06-26 DIAGNOSIS — M79672 Pain in left foot: Secondary | ICD-10-CM

## 2024-06-26 DIAGNOSIS — M19042 Primary osteoarthritis, left hand: Secondary | ICD-10-CM

## 2024-06-26 DIAGNOSIS — I10 Essential (primary) hypertension: Secondary | ICD-10-CM | POA: Diagnosis not present

## 2024-06-26 NOTE — Patient Instructions (Addendum)
 Standing Labs We placed an order today for your standing lab work.   Please have your standing labs drawn in July and then every 5 months.   Please have your labs drawn 2 weeks prior to your appointment so that the provider can discuss your lab results at your appointment, if possible.  Please note that you may see your imaging and lab results in MyChart before we have reviewed them. We will contact you once all results are reviewed. Please allow our office up to 72 hours to thoroughly review all of the results before contacting the office for clarification of your results.  WALK-IN LAB HOURS  Monday through Thursday from 8:00 am - 4:00 pm and Friday from 8:00 am-12:00 pm.  Patients with office visits requiring labs will be seen before walk-in labs.  You may encounter longer than normal wait times. Please allow additional time. Wait times may be shorter on  Monday and Thursday afternoons.  We do not book appointments for walk-in labs. We appreciate your patience and understanding with our staff.   Labs are drawn by [facility]. Please bring your co-pay at the time of your lab draw.  You may receive a bill from [facility] for your lab work.  Please note if you are on Hydroxychloroquine  and and an order has been placed for a Hydroxychloroquine  level,  you will need to have it drawn 4 hours or more after your last dose.  If you wish to have your labs drawn at another location, please call the office 24 hours in advance so we can fax the orders.  The office is located at [address], [address], [place], [place] [address]   If you have any questions regarding directions or hours of operation,  please call [phone].   As a reminder, please drink plenty of water prior to coming for your lab work. Thanks!    Hydroxychloroquine  Tablets What is this medication? HYDROXYCHLOROQUINE  (hye drox ee KLOR oh kwin) treats autoimmune conditions, such as rheumatoid arthritis and lupus. It works by slowing  down an overactive immune system. It may also be used to prevent and treat malaria. It works by killing the parasite that causes malaria. It belongs to a group of medications called DMARDs. This medicine may be used for other purposes; ask your health care provider or pharmacist if you have questions. COMMON BRAND NAME(S): Plaquenil , Quineprox, SOVUNA  What should I tell my care team before I take this medication? They need to know if you have any of these conditions: Diabetes Eye disease, vision problems Frequently drink alcohol G6PD deficiency Heart disease Irregular heartbeat or rhythm Kidney disease Liver disease Porphyria Psoriasis An unusual or allergic reaction to hydroxychloroquine , other medications, foods, dyes, or preservatives Pregnant or trying to get pregnant Breastfeeding How should I use this medication? Take this medication by mouth with water. Take it as directed on the prescription label. Do not cut, crush, or chew this medication. Swallow the tablets whole. Take it with food. Do not take it more than directed. Take all of this medication unless your care team tells you to stop it early. Keep taking it even if you think you are better. Take products with antacids in them at a different time of day than this medication. Take this medication 4 hours before or 4 hours after antacids. Talk to your care team if you have questions. Talk to your care team about the use of this medication in children. While this medication may be prescribed for selected conditions,  precautions do apply. Overdosage: If you think you have taken too much of this medicine contact a poison control center or emergency room at once. NOTE: This medicine is only for you. Do not share this medicine with others. What if I miss a dose? If you miss a dose, take it as soon as you can. If it is almost time for your next dose, take only that dose. Do not take double or extra doses. What may interact with this  medication? Do not take this medication with any of the following: Cisapride Dronedarone Pimozide Thioridazine This medication may also interact with the following: Ampicillin Antacids Cimetidine Cyclosporine Digoxin Kaolin Medications for diabetes, such as insulin, glipizide, glyburide Medications for seizures, such as carbamazepine, phenobarbital, phenytoin Mefloquine Methotrexate Other medications that cause heart rhythm changes Praziquantel This list may not describe all possible interactions. Give your health care provider a list of all the medicines, herbs, non-prescription drugs, or dietary supplements you use. Also tell them if you smoke, drink alcohol, or use illegal drugs. Some items may interact with your medicine. What should I watch for while using this medication? Visit your care team for regular checks on your progress. Tell your care team if your symptoms do not start to get better or if they get worse. You may need blood work done while you are taking this medication. If you take other medications that can affect heart rhythm, you may need more testing. Talk to your care team if you have questions. Your vision may be tested before and during use of this medication. Tell your care team right away if you have any change in your eyesight. This medication may cause serious skin reactions. They can happen weeks to months after starting the medication. Contact your care team right away if you notice fevers or flu-like symptoms with a rash. The rash may be red or purple and then turn into blisters or peeling of the skin. Or, you might notice a red rash with swelling of the face, lips or lymph nodes in your neck or under your arms. If you or your family notice any changes in your behavior, such as new or worsening depression, thoughts of harming yourself, anxiety, or other unusual or disturbing thoughts, or memory loss, call your care team right away. What side effects may I notice  from receiving this medication? Side effects that you should report to your care team as soon as possible: Allergic reactions--skin rash, itching, hives, swelling of the face, lips, tongue, or throat Aplastic anemia--unusual weakness or fatigue, dizziness, headache, trouble breathing, increased bleeding or bruising Change in vision Heart rhythm changes--fast or irregular heartbeat, dizziness, feeling faint or lightheaded, chest pain, trouble breathing Infection--fever, chills, cough, or sore throat Low blood sugar (hypoglycemia)--tremors or shaking, anxiety, sweating, cold or clammy skin, confusion, dizziness, rapid heartbeat Muscle injury--unusual weakness or fatigue, muscle pain, dark yellow or brown urine, decrease in amount of urine Pain, tingling, or numbness in the hands or feet Rash, fever, and swollen lymph nodes Redness, blistering, peeling, or loosening of the skin, including inside the mouth Thoughts of suicide or self-harm, worsening mood, or feelings of depression Unusual bruising or bleeding Side effects that usually do not require medical attention (report to your care team if they continue or are bothersome): Diarrhea Headache Nausea Stomach pain Vomiting This list may not describe all possible side effects. Call your doctor for medical advice about side effects. You may report side effects to FDA at 1-800-FDA-1088. Where should I keep  my medication? Keep out of the reach of children and pets. Store at room temperature up to 30 degrees C (86 degrees F). Protect from light. Get rid of any unused medication after the expiration date. To get rid of medications that are no longer needed or have expired: Take the medication to a medication take-back program. Check with your pharmacy or law enforcement to find a location. If you cannot return the medication, check the label or package insert to see if the medication should be thrown out in the garbage or flushed down the toilet.  If you are not sure, ask your care team. If it is safe to put it in the trash, empty the medication out of the container. Mix the medication with cat litter, dirt, coffee grounds, or other unwanted substance. Seal the mixture in a bag or container. Put it in the trash. NOTE: This sheet is a summary. It may not cover all possible information. If you have questions about this medicine, talk to your doctor, pharmacist, or health care provider.  2024 Elsevier/Gold Standard (2021-11-15 00:00:00)  Vaccines You are taking a medication(s) that can suppress your immune system.  The following immunizations are recommended: Flu annually Covid-19  RSV Td/Tdap (tetanus, diphtheria, pertussis) every 10 years Pneumonia (Prevnar 15 then Pneumovax 23 at least 1 year apart.  Alternatively, can take Prevnar 20 without needing additional dose) Shingrix: 2 doses from 4 weeks to 6 months apart  Please check with your PCP to make sure you are up to date.

## 2024-06-26 NOTE — Telephone Encounter (Signed)
 Per Dr. Dolphus: we will send a prescription for hydroxychloroquine  200 mg p.o. daily after the labs are available. Patient requested a 90-day supply.   Consent was obtained and sent to the scan center.

## 2024-06-27 LAB — CBC WITH DIFFERENTIAL/PLATELET
Absolute Lymphocytes: 1953 {cells}/uL (ref 850–3900)
Absolute Monocytes: 504 {cells}/uL (ref 200–950)
Basophils Absolute: 69 {cells}/uL (ref 0–200)
Basophils Relative: 1 %
Eosinophils Absolute: 200 {cells}/uL (ref 15–500)
Eosinophils Relative: 2.9 %
HCT: 44.5 % (ref 35.9–46.0)
Hemoglobin: 14 g/dL (ref 11.7–15.5)
MCH: 29.6 pg (ref 27.0–33.0)
MCHC: 31.5 g/dL — ABNORMAL LOW (ref 31.6–35.4)
MCV: 94.1 fL (ref 81.4–101.7)
MPV: 10.8 fL (ref 7.5–12.5)
Monocytes Relative: 7.3 %
Neutro Abs: 4175 {cells}/uL (ref 1500–7800)
Neutrophils Relative %: 60.5 %
Platelets: 275 10*3/uL (ref 140–400)
RBC: 4.73 Million/uL (ref 3.80–5.10)
RDW: 13.2 % (ref 11.0–15.0)
Total Lymphocyte: 28.3 %
WBC: 6.9 10*3/uL (ref 3.8–10.8)

## 2024-06-27 LAB — SEDIMENTATION RATE: Sed Rate: 6 mm/h (ref 0–30)

## 2024-06-27 LAB — COMPREHENSIVE METABOLIC PANEL WITH GFR
AG Ratio: 1.4 (calc) (ref 1.0–2.5)
ALT: 27 U/L (ref 6–29)
AST: 33 U/L (ref 10–35)
Albumin: 4.3 g/dL (ref 3.6–5.1)
Alkaline phosphatase (APISO): 59 U/L (ref 37–153)
BUN: 22 mg/dL (ref 7–25)
CO2: 27 mmol/L (ref 20–32)
Calcium: 9.5 mg/dL (ref 8.6–10.4)
Chloride: 103 mmol/L (ref 98–110)
Creat: 0.74 mg/dL (ref 0.60–1.00)
Globulin: 3.1 g/dL (ref 1.9–3.7)
Glucose, Bld: 92 mg/dL (ref 65–99)
Potassium: 4.2 mmol/L (ref 3.5–5.3)
Sodium: 140 mmol/L (ref 135–146)
Total Bilirubin: 0.5 mg/dL (ref 0.2–1.2)
Total Protein: 7.4 g/dL (ref 6.1–8.1)
eGFR: 87 mL/min/{1.73_m2}

## 2024-06-27 LAB — PROTEIN / CREATININE RATIO, URINE
Creatinine, Urine: 76 mg/dL (ref 20–275)
Protein/Creat Ratio: 171 mg/g{creat} (ref 24–184)
Protein/Creatinine Ratio: 0.171 mg/mg{creat} (ref 0.024–0.184)
Total Protein, Urine: 13 mg/dL (ref 5–24)

## 2024-06-27 LAB — RNP ANTIBODY: Ribonucleic Protein(ENA) Antibody, IgG: <1 AI

## 2024-06-27 LAB — SJOGRENS SYNDROME-B EXTRACTABLE NUCLEAR ANTIBODY: SSB (La) (ENA) Antibody, IgG: <1 AI

## 2024-06-27 LAB — ANTI-DNA ANTIBODY, DOUBLE-STRANDED: ds DNA Ab: 4 [IU]/mL

## 2024-06-27 LAB — ANTI-SMITH ANTIBODY: ENA SM Ab Ser-aCnc: <1 AI

## 2024-06-27 LAB — C3 AND C4
C3 Complement: 110 mg/dL (ref 83–193)
C4 Complement: 25 mg/dL (ref 15–57)

## 2024-06-27 LAB — TSH: TSH: 2.21 m[IU]/L (ref 0.40–4.50)

## 2024-06-27 LAB — SJOGRENS SYNDROME-A EXTRACTABLE NUCLEAR ANTIBODY: SSA (Ro) (ENA) Antibody, IgG: <1 AI

## 2024-06-27 LAB — ANTI-SCLERODERMA ANTIBODY: Scleroderma (Scl-70) (ENA) Antibody, IgG: <1 AI

## 2024-06-27 LAB — CK: Total CK: 104 U/L (ref 18–225)

## 2024-06-28 ENCOUNTER — Ambulatory Visit: Payer: Self-pay | Admitting: Rheumatology

## 2024-06-28 NOTE — Progress Notes (Signed)
 CBC normal, CMP normal, CK normal, TSH normal, sed rate normal, ENA panel negative, complements normal, urine protein creatinine ratio normal, phospholipid antibodies pending and ANA pending.

## 2024-07-22 ENCOUNTER — Ambulatory Visit: Admitting: Internal Medicine

## 2024-07-23 ENCOUNTER — Ambulatory Visit: Admitting: Rheumatology

## 2025-05-22 ENCOUNTER — Encounter: Admitting: Family Medicine
